# Patient Record
Sex: Female | Born: 1945 | Race: White | Hispanic: No | Marital: Married | Smoking: Never smoker
Health system: Southern US, Community
[De-identification: ages and names within clinical notes are randomized; demographics above are authoritative.]

## PROBLEM LIST (undated history)

## (undated) DIAGNOSIS — T8859XA Other complications of anesthesia, initial encounter: Secondary | ICD-10-CM

## (undated) DIAGNOSIS — Z98811 Dental restoration status: Secondary | ICD-10-CM

## (undated) DIAGNOSIS — H3530 Unspecified macular degeneration: Secondary | ICD-10-CM

## (undated) DIAGNOSIS — M7741 Metatarsalgia, right foot: Secondary | ICD-10-CM

## (undated) DIAGNOSIS — G43909 Migraine, unspecified, not intractable, without status migrainosus: Secondary | ICD-10-CM

## (undated) DIAGNOSIS — T4145XA Adverse effect of unspecified anesthetic, initial encounter: Secondary | ICD-10-CM

## (undated) DIAGNOSIS — M21611 Bunion of right foot: Secondary | ICD-10-CM

## (undated) DIAGNOSIS — M26629 Arthralgia of temporomandibular joint, unspecified side: Secondary | ICD-10-CM

## (undated) HISTORY — DX: Migraine, unspecified, not intractable, without status migrainosus: G43.909

---
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
## 1998-02-16 ENCOUNTER — Other Ambulatory Visit: Admission: RE | Admit: 1998-02-16 | Discharge: 1998-02-16 | Payer: Self-pay | Admitting: *Deleted

## 1999-02-16 ENCOUNTER — Other Ambulatory Visit: Admission: RE | Admit: 1999-02-16 | Discharge: 1999-02-16 | Payer: Self-pay | Admitting: *Deleted

## 2000-03-05 ENCOUNTER — Other Ambulatory Visit: Admission: RE | Admit: 2000-03-05 | Discharge: 2000-03-05 | Payer: Self-pay | Admitting: *Deleted

## 2000-03-06 ENCOUNTER — Ambulatory Visit (HOSPITAL_COMMUNITY): Admission: RE | Admit: 2000-03-06 | Discharge: 2000-03-06 | Payer: Self-pay | Admitting: Cardiology

## 2001-03-12 ENCOUNTER — Other Ambulatory Visit: Admission: RE | Admit: 2001-03-12 | Discharge: 2001-03-12 | Payer: Self-pay | Admitting: *Deleted

## 2002-04-15 ENCOUNTER — Other Ambulatory Visit: Admission: RE | Admit: 2002-04-15 | Discharge: 2002-04-15 | Payer: Self-pay | Admitting: *Deleted

## 2004-01-08 ENCOUNTER — Encounter: Admission: RE | Admit: 2004-01-08 | Discharge: 2004-01-08 | Payer: Self-pay | Admitting: *Deleted

## 2005-01-19 ENCOUNTER — Encounter: Admission: RE | Admit: 2005-01-19 | Discharge: 2005-01-19 | Payer: Self-pay | Admitting: *Deleted

## 2007-01-23 ENCOUNTER — Encounter: Admission: RE | Admit: 2007-01-23 | Discharge: 2007-01-23 | Payer: Self-pay | Admitting: Internal Medicine

## 2008-01-24 ENCOUNTER — Encounter: Admission: RE | Admit: 2008-01-24 | Discharge: 2008-01-24 | Payer: Self-pay | Admitting: Internal Medicine

## 2009-02-25 ENCOUNTER — Encounter: Admission: RE | Admit: 2009-02-25 | Discharge: 2009-02-25 | Payer: Self-pay | Admitting: Internal Medicine

## 2010-01-25 ENCOUNTER — Encounter (INDEPENDENT_AMBULATORY_CARE_PROVIDER_SITE_OTHER): Payer: Self-pay | Admitting: *Deleted

## 2010-03-03 ENCOUNTER — Encounter: Admission: RE | Admit: 2010-03-03 | Discharge: 2010-03-03 | Payer: Self-pay | Admitting: Internal Medicine

## 2010-07-19 NOTE — Letter (Signed)
Summary: Colonoscopy Letter  [facility]  [address] [place], [place] [address]   Phone: [phone]  Fax: [phone]      January 25, 2010 MRN: [id number]   Heather Lewis  [address] [place], [place]  [address]   Dear Ms. BOKHARI ,   According to your medical record, it is time for you to schedule a Colonoscopy. The American Cancer Society recommends this procedure as a method to detect early colon cancer. Patients with a family history of colon cancer, or a personal history of colon polyps or inflammatory bowel disease are at increased risk.  This letter has beeen generated based on the recommendations made at the time of your procedure. If you feel that in your particular situation this may no longer apply, please contact our office.  Please call our office at [phone] to schedule this appointment or to update your records at your earliest convenience.  Thank you for cooperating with [place] to provide you with the very best care possible.   Sincerely,  Hedwig Morton. Juanda Chance, M.D.  [facility]astroenterology Division [phone]

## 2010-11-04 NOTE — Procedures (Signed)
[facility]  Patient:    Heather Lewis, Heather Lewis                         MRN: [id number] Adm. Date:  [id number] Attending:  Mervin Hack CC:         Pershing Cox, M.D.   Procedure Report  PROCEDURE:  Colonoscopy.  ENDOSCOPIST:  Hedwig Morton. Juanda Chance, M.D.  INDICATIONS:  This 65 year old white female has a positive family history of colon polyps in her mother, father and a brother, who is 37 years old.  She had previous Hemoccults which were negative.  She denies any specific symptoms such as rectal bleeding or abdominal pain.  She is undergoing her first colonoscopy.  ENDOSCOPE:  Olympus single-channel video endoscope.  SEDATION:  Versed 5 mg IV, Demerol 50 mg IV.  FINDINGS:  Olympus single-channel videoscope was passed directly into the rectum to the sigmoid colon.  Patient was monitored by pulse oximeter; her oxygen saturation were normal.  Anal canal and rectal ampulla was unremarkable.  There were no hemorrhoids.  Sigmoid colon mucosa was normal. No diverticula.  Submucosal blood vessels were normal throughout the colon. Descending colon, splenic flexure, transverse colon, hepatic flexure were traversed without difficulty and showed normal-appearing mucosa.  The right colon and cecum were normal.  Ileocecal valve and appendiceal opening were noted.  Video photographs of the cecal pouch were obtained.  The colonoscope was then retracted and the colon decompressed.  No polyps were found.  IMPRESSION:  Normal colonoscopy to the cecum.  PLAN 1. High-fiber diet. 2. Repeat colonoscopy in 7 to 10 years. 3. Continue regular stool Hemoccults, as per Dr. Pershing Cox. DD:  03/06/00 TD:  03/06/00 Job: [id number]

## 2011-02-08 ENCOUNTER — Other Ambulatory Visit: Payer: Self-pay | Admitting: Internal Medicine

## 2011-02-08 DIAGNOSIS — Z1231 Encounter for screening mammogram for malignant neoplasm of breast: Secondary | ICD-10-CM

## 2011-02-09 ENCOUNTER — Encounter: Payer: Self-pay | Admitting: Internal Medicine

## 2011-03-06 ENCOUNTER — Ambulatory Visit (AMBULATORY_SURGERY_CENTER): Payer: Medicare Other | Admitting: *Deleted

## 2011-03-06 ENCOUNTER — Encounter: Payer: Self-pay | Admitting: Internal Medicine

## 2011-03-06 VITALS — Ht 63.0 in | Wt 121.0 lb

## 2011-03-06 DIAGNOSIS — Z1211 Encounter for screening for malignant neoplasm of colon: Secondary | ICD-10-CM

## 2011-03-06 MED ORDER — PEG-KCL-NACL-NASULF-NA ASC-C 100 G PO SOLR: ORAL | Status: DC

## 2011-03-14 ENCOUNTER — Ambulatory Visit
Admission: RE | Admit: 2011-03-14 | Discharge: 2011-03-14 | Disposition: A | Discharge status: Home / Self Care | Payer: Medicare Other | Source: Ambulatory Visit | Attending: Internal Medicine | Admitting: Internal Medicine

## 2011-03-14 DIAGNOSIS — Z1231 Encounter for screening mammogram for malignant neoplasm of breast: Secondary | ICD-10-CM

## 2011-03-20 ENCOUNTER — Ambulatory Visit (AMBULATORY_SURGERY_CENTER): Payer: Medicare Other | Admitting: Internal Medicine

## 2011-03-20 ENCOUNTER — Encounter: Payer: Self-pay | Admitting: Internal Medicine

## 2011-03-20 VITALS — BP 136/75 | HR 102 | Temp 99.2°F | Resp 15 | Ht 63.0 in | Wt 121.0 lb

## 2011-03-20 DIAGNOSIS — Z1211 Encounter for screening for malignant neoplasm of colon: Secondary | ICD-10-CM

## 2011-03-20 DIAGNOSIS — D126 Benign neoplasm of colon, unspecified: Secondary | ICD-10-CM

## 2011-03-20 DIAGNOSIS — Z800 Family history of malignant neoplasm of digestive organs: Secondary | ICD-10-CM

## 2011-03-20 MED ORDER — SODIUM CHLORIDE 0.9 % IV SOLN: 500.0000 mL | INTRAVENOUS | Status: DC

## 2011-03-20 NOTE — Patient Instructions (Signed)
Discharge instructions given with verbal understanding.  Handouts on polyps and diverticulosis given.  Resume previous medications. 

## 2011-03-21 ENCOUNTER — Telehealth: Payer: Self-pay | Admitting: *Deleted

## 2011-03-21 NOTE — Telephone Encounter (Signed)
Follow up Call- Patient questions:  Do you have a fever, pain , or abdominal swelling? no Pain Score  0 *  Have you tolerated food without any problems? yes  Have you been able to return to your normal activities? no  Do you have any questions about your discharge instructions: Diet   no Medications  no Follow up visit  no  Do you have questions or concerns about your Care? no  Actions: * If pain score is 4 or above: No action needed, pain <4.   

## 2011-03-23 ENCOUNTER — Encounter: Payer: Self-pay | Admitting: Internal Medicine

## 2012-03-04 ENCOUNTER — Other Ambulatory Visit: Payer: Self-pay | Admitting: Internal Medicine

## 2012-03-04 DIAGNOSIS — Z1231 Encounter for screening mammogram for malignant neoplasm of breast: Secondary | ICD-10-CM

## 2012-03-22 ENCOUNTER — Ambulatory Visit
Admission: RE | Admit: 2012-03-22 | Discharge: 2012-03-22 | Disposition: A | Discharge status: Home / Self Care | Payer: Medicare Other | Source: Ambulatory Visit | Attending: Internal Medicine | Admitting: Internal Medicine

## 2012-03-22 DIAGNOSIS — Z1231 Encounter for screening mammogram for malignant neoplasm of breast: Secondary | ICD-10-CM

## 2013-01-30 ENCOUNTER — Other Ambulatory Visit: Payer: Self-pay | Admitting: Dermatology

## 2013-03-07 ENCOUNTER — Other Ambulatory Visit: Payer: Self-pay

## 2013-03-07 DIAGNOSIS — Z1231 Encounter for screening mammogram for malignant neoplasm of breast: Secondary | ICD-10-CM

## 2013-03-27 ENCOUNTER — Ambulatory Visit
Admission: RE | Admit: 2013-03-27 | Discharge: 2013-03-27 | Disposition: A | Discharge status: Home / Self Care | Payer: Medicare Other | Source: Ambulatory Visit

## 2013-03-27 DIAGNOSIS — Z1231 Encounter for screening mammogram for malignant neoplasm of breast: Secondary | ICD-10-CM

## 2013-08-15 ENCOUNTER — Other Ambulatory Visit: Payer: Self-pay | Admitting: Dermatology

## 2013-09-25 ENCOUNTER — Other Ambulatory Visit: Payer: Self-pay | Admitting: Dermatology

## 2014-03-24 ENCOUNTER — Other Ambulatory Visit: Payer: Self-pay

## 2014-03-24 DIAGNOSIS — Z1239 Encounter for other screening for malignant neoplasm of breast: Secondary | ICD-10-CM

## 2014-04-02 ENCOUNTER — Ambulatory Visit
Admission: RE | Admit: 2014-04-02 | Discharge: 2014-04-02 | Disposition: A | Discharge status: Home / Self Care | Payer: Medicare HMO | Source: Ambulatory Visit

## 2014-04-02 DIAGNOSIS — Z1239 Encounter for other screening for malignant neoplasm of breast: Secondary | ICD-10-CM

## 2015-04-02 ENCOUNTER — Other Ambulatory Visit: Payer: Self-pay

## 2015-04-02 DIAGNOSIS — Z1231 Encounter for screening mammogram for malignant neoplasm of breast: Secondary | ICD-10-CM

## 2015-04-21 ENCOUNTER — Ambulatory Visit
Admission: RE | Admit: 2015-04-21 | Discharge: 2015-04-21 | Disposition: A | Discharge status: Home / Self Care | Payer: Medicare HMO | Source: Ambulatory Visit

## 2015-04-21 DIAGNOSIS — Z0000 Encounter for general adult medical examination without abnormal findings: Secondary | ICD-10-CM | POA: Diagnosis not present

## 2015-04-21 DIAGNOSIS — E559 Vitamin D deficiency, unspecified: Secondary | ICD-10-CM | POA: Diagnosis not present

## 2015-04-21 DIAGNOSIS — M859 Disorder of bone density and structure, unspecified: Secondary | ICD-10-CM | POA: Diagnosis not present

## 2015-04-21 DIAGNOSIS — Z1231 Encounter for screening mammogram for malignant neoplasm of breast: Secondary | ICD-10-CM

## 2015-04-23 ENCOUNTER — Other Ambulatory Visit: Payer: Self-pay | Admitting: Internal Medicine

## 2015-04-23 DIAGNOSIS — R928 Other abnormal and inconclusive findings on diagnostic imaging of breast: Secondary | ICD-10-CM

## 2015-04-30 ENCOUNTER — Ambulatory Visit
Admission: RE | Admit: 2015-04-30 | Discharge: 2015-04-30 | Disposition: A | Discharge status: Home / Self Care | Payer: Medicare HMO | Source: Ambulatory Visit | Attending: Internal Medicine | Admitting: Internal Medicine

## 2015-04-30 DIAGNOSIS — E559 Vitamin D deficiency, unspecified: Secondary | ICD-10-CM | POA: Diagnosis not present

## 2015-04-30 DIAGNOSIS — Z0000 Encounter for general adult medical examination without abnormal findings: Secondary | ICD-10-CM | POA: Diagnosis not present

## 2015-04-30 DIAGNOSIS — G43909 Migraine, unspecified, not intractable, without status migrainosus: Secondary | ICD-10-CM | POA: Diagnosis not present

## 2015-04-30 DIAGNOSIS — R928 Other abnormal and inconclusive findings on diagnostic imaging of breast: Secondary | ICD-10-CM

## 2015-04-30 DIAGNOSIS — R69 Illness, unspecified: Secondary | ICD-10-CM | POA: Diagnosis not present

## 2015-04-30 DIAGNOSIS — Z801 Family history of malignant neoplasm of trachea, bronchus and lung: Secondary | ICD-10-CM | POA: Diagnosis not present

## 2015-04-30 DIAGNOSIS — D126 Benign neoplasm of colon, unspecified: Secondary | ICD-10-CM | POA: Diagnosis not present

## 2015-04-30 DIAGNOSIS — M545 Low back pain: Secondary | ICD-10-CM | POA: Diagnosis not present

## 2015-04-30 DIAGNOSIS — N6489 Other specified disorders of breast: Secondary | ICD-10-CM | POA: Diagnosis not present

## 2015-04-30 DIAGNOSIS — Z6821 Body mass index (BMI) 21.0-21.9, adult: Secondary | ICD-10-CM | POA: Diagnosis not present

## 2015-04-30 DIAGNOSIS — N390 Urinary tract infection, site not specified: Secondary | ICD-10-CM | POA: Diagnosis not present

## 2015-04-30 DIAGNOSIS — M859 Disorder of bone density and structure, unspecified: Secondary | ICD-10-CM | POA: Diagnosis not present

## 2015-05-05 DIAGNOSIS — Z1212 Encounter for screening for malignant neoplasm of rectum: Secondary | ICD-10-CM | POA: Diagnosis not present

## 2015-07-13 DIAGNOSIS — R69 Illness, unspecified: Secondary | ICD-10-CM | POA: Diagnosis not present

## 2015-08-09 DIAGNOSIS — R69 Illness, unspecified: Secondary | ICD-10-CM | POA: Diagnosis not present

## 2015-09-09 DIAGNOSIS — D2272 Melanocytic nevi of left lower limb, including hip: Secondary | ICD-10-CM | POA: Diagnosis not present

## 2015-09-09 DIAGNOSIS — L570 Actinic keratosis: Secondary | ICD-10-CM | POA: Diagnosis not present

## 2015-09-09 DIAGNOSIS — D229 Melanocytic nevi, unspecified: Secondary | ICD-10-CM | POA: Diagnosis not present

## 2015-09-09 DIAGNOSIS — Z23 Encounter for immunization: Secondary | ICD-10-CM | POA: Diagnosis not present

## 2015-09-09 DIAGNOSIS — D224 Melanocytic nevi of scalp and neck: Secondary | ICD-10-CM | POA: Diagnosis not present

## 2015-09-09 DIAGNOSIS — M79672 Pain in left foot: Secondary | ICD-10-CM | POA: Diagnosis not present

## 2015-09-09 DIAGNOSIS — Z6822 Body mass index (BMI) 22.0-22.9, adult: Secondary | ICD-10-CM | POA: Diagnosis not present

## 2015-09-09 DIAGNOSIS — D225 Melanocytic nevi of trunk: Secondary | ICD-10-CM | POA: Diagnosis not present

## 2015-09-09 DIAGNOSIS — S99922A Unspecified injury of left foot, initial encounter: Secondary | ICD-10-CM | POA: Diagnosis not present

## 2015-09-09 DIAGNOSIS — Z808 Family history of malignant neoplasm of other organs or systems: Secondary | ICD-10-CM | POA: Diagnosis not present

## 2015-09-09 DIAGNOSIS — L821 Other seborrheic keratosis: Secondary | ICD-10-CM | POA: Diagnosis not present

## 2015-09-09 DIAGNOSIS — Z86018 Personal history of other benign neoplasm: Secondary | ICD-10-CM | POA: Diagnosis not present

## 2015-09-09 DIAGNOSIS — D2230 Melanocytic nevi of unspecified part of face: Secondary | ICD-10-CM | POA: Diagnosis not present

## 2015-09-13 DIAGNOSIS — M79672 Pain in left foot: Secondary | ICD-10-CM | POA: Diagnosis not present

## 2015-09-27 DIAGNOSIS — M79672 Pain in left foot: Secondary | ICD-10-CM | POA: Diagnosis not present

## 2015-09-28 DIAGNOSIS — H2513 Age-related nuclear cataract, bilateral: Secondary | ICD-10-CM | POA: Diagnosis not present

## 2015-09-28 DIAGNOSIS — H35362 Drusen (degenerative) of macula, left eye: Secondary | ICD-10-CM | POA: Diagnosis not present

## 2015-09-28 DIAGNOSIS — H353121 Nonexudative age-related macular degeneration, left eye, early dry stage: Secondary | ICD-10-CM | POA: Diagnosis not present

## 2015-10-25 DIAGNOSIS — M79672 Pain in left foot: Secondary | ICD-10-CM | POA: Diagnosis not present

## 2015-11-09 ENCOUNTER — Encounter: Payer: Self-pay | Admitting: Internal Medicine

## 2015-11-29 DIAGNOSIS — M79672 Pain in left foot: Secondary | ICD-10-CM | POA: Diagnosis not present

## 2016-01-24 ENCOUNTER — Encounter: Payer: Self-pay | Admitting: Gastroenterology

## 2016-02-10 DIAGNOSIS — R69 Illness, unspecified: Secondary | ICD-10-CM | POA: Diagnosis not present

## 2016-03-31 ENCOUNTER — Other Ambulatory Visit: Payer: Self-pay | Admitting: Internal Medicine

## 2016-03-31 DIAGNOSIS — Z1231 Encounter for screening mammogram for malignant neoplasm of breast: Secondary | ICD-10-CM

## 2016-04-27 ENCOUNTER — Ambulatory Visit
Admission: RE | Admit: 2016-04-27 | Discharge: 2016-04-27 | Disposition: A | Discharge status: Home / Self Care | Payer: Medicare HMO | Source: Ambulatory Visit | Attending: Internal Medicine | Admitting: Internal Medicine

## 2016-04-27 DIAGNOSIS — Z1231 Encounter for screening mammogram for malignant neoplasm of breast: Secondary | ICD-10-CM

## 2016-04-28 DIAGNOSIS — Z0000 Encounter for general adult medical examination without abnormal findings: Secondary | ICD-10-CM | POA: Diagnosis not present

## 2016-04-28 DIAGNOSIS — E559 Vitamin D deficiency, unspecified: Secondary | ICD-10-CM | POA: Diagnosis not present

## 2016-05-05 DIAGNOSIS — Z801 Family history of malignant neoplasm of trachea, bronchus and lung: Secondary | ICD-10-CM | POA: Diagnosis not present

## 2016-05-05 DIAGNOSIS — M859 Disorder of bone density and structure, unspecified: Secondary | ICD-10-CM | POA: Diagnosis not present

## 2016-05-05 DIAGNOSIS — R69 Illness, unspecified: Secondary | ICD-10-CM | POA: Diagnosis not present

## 2016-05-05 DIAGNOSIS — D126 Benign neoplasm of colon, unspecified: Secondary | ICD-10-CM | POA: Diagnosis not present

## 2016-05-05 DIAGNOSIS — M545 Low back pain: Secondary | ICD-10-CM | POA: Diagnosis not present

## 2016-05-05 DIAGNOSIS — Z6821 Body mass index (BMI) 21.0-21.9, adult: Secondary | ICD-10-CM | POA: Diagnosis not present

## 2016-05-05 DIAGNOSIS — Z0000 Encounter for general adult medical examination without abnormal findings: Secondary | ICD-10-CM | POA: Diagnosis not present

## 2016-05-05 DIAGNOSIS — G43909 Migraine, unspecified, not intractable, without status migrainosus: Secondary | ICD-10-CM | POA: Diagnosis not present

## 2016-05-05 DIAGNOSIS — G4700 Insomnia, unspecified: Secondary | ICD-10-CM | POA: Diagnosis not present

## 2016-05-05 DIAGNOSIS — E559 Vitamin D deficiency, unspecified: Secondary | ICD-10-CM | POA: Diagnosis not present

## 2016-05-08 ENCOUNTER — Telehealth: Payer: Self-pay | Admitting: Gastroenterology

## 2016-05-08 NOTE — Telephone Encounter (Signed)
OK 

## 2016-05-26 ENCOUNTER — Encounter: Payer: Self-pay | Admitting: Gastroenterology

## 2016-07-25 ENCOUNTER — Ambulatory Visit (AMBULATORY_SURGERY_CENTER): Payer: Self-pay

## 2016-07-25 VITALS — Ht 63.0 in | Wt 125.6 lb

## 2016-07-25 DIAGNOSIS — Z8371 Family history of colonic polyps: Secondary | ICD-10-CM

## 2016-07-25 DIAGNOSIS — Z86010 Personal history of colonic polyps: Secondary | ICD-10-CM

## 2016-07-25 MED ORDER — NA SULFATE-K SULFATE-MG SULF 17.5-3.13-1.6 GM/177ML PO SOLN: ORAL | 0 refills | Status: DC

## 2016-07-25 NOTE — Progress Notes (Signed)
Per pt, no allergies to soy or egg products.Pt not taking any weight loss meds or using  O2 at home.   Per pt, hard to wake up past certain sedation.

## 2016-07-26 ENCOUNTER — Encounter: Payer: Self-pay | Admitting: Gastroenterology

## 2016-08-08 ENCOUNTER — Encounter: Payer: Self-pay | Admitting: Gastroenterology

## 2016-08-08 ENCOUNTER — Ambulatory Visit (AMBULATORY_SURGERY_CENTER): Payer: Medicare HMO | Admitting: Gastroenterology

## 2016-08-08 VITALS — BP 119/71 | HR 72 | Temp 98.4°F | Resp 16 | Ht 63.0 in | Wt 125.0 lb

## 2016-08-08 DIAGNOSIS — D123 Benign neoplasm of transverse colon: Secondary | ICD-10-CM

## 2016-08-08 DIAGNOSIS — Z86010 Personal history of colonic polyps: Secondary | ICD-10-CM | POA: Diagnosis not present

## 2016-08-08 DIAGNOSIS — D122 Benign neoplasm of ascending colon: Secondary | ICD-10-CM | POA: Diagnosis not present

## 2016-08-08 DIAGNOSIS — Z1211 Encounter for screening for malignant neoplasm of colon: Secondary | ICD-10-CM | POA: Diagnosis not present

## 2016-08-08 HISTORY — PX: COLONOSCOPY WITH PROPOFOL: SHX5780

## 2016-08-08 MED ORDER — SODIUM CHLORIDE 0.9 % IV SOLN: 500.0000 mL | INTRAVENOUS | Status: DC

## 2016-08-08 NOTE — Op Note (Signed)
[facility] Patient Name: Heather Lewis Procedure Date: 08/08/2016 10:22 AM MRN: [id number] Endoscopist: Ladene Artist , MD Age: 71 Referring MD:  Date of Birth: 12/06/1945 Gender: Female Account #: [id number] Procedure:                Colonoscopy Indications:              Surveillance: Personal history of adenomatous                            polyps on last colonoscopy > 5 years ago. Family                            history of colon polyps-both parent and a sibling. Medicines:                Monitored Anesthesia Care Procedure:                Pre-Anesthesia Assessment:                           - Prior to the procedure, a History and Physical                            was performed, and patient medications and                            allergies were reviewed. The patient's tolerance of                            previous anesthesia was also reviewed. The risks                            and benefits of the procedure and the sedation                            options and risks were discussed with the patient.                            All questions were answered, and informed consent                            was obtained. Prior Anticoagulants: The patient has                            taken no previous anticoagulant or antiplatelet                            agents. ASA Grade Assessment: II - A patient with                            mild systemic disease. After reviewing the risks                            and benefits, the patient was deemed in  satisfactory condition to undergo the procedure.                           After obtaining informed consent, the colonoscope                            was passed under direct vision. Throughout the                            procedure, the patient's blood pressure, pulse, and                            oxygen saturations were monitored continuously. The                            Model  PCF-[id number]) scope was introduced                            through the anus and advanced to the the cecum,                            identified by appendiceal orifice and ileocecal                            valve. The ileocecal valve, appendiceal orifice,                            and rectum were photographed. The quality of the                            bowel preparation was good. The colonoscopy was                            performed without difficulty. The patient tolerated                            the procedure well. Scope In: 11:01:43 AM Scope Out: 11:17:38 AM Scope Withdrawal Time: 0 hours 10 minutes 50 seconds  Total Procedure Duration: 0 hours 15 minutes 55 seconds  Findings:                 The perianal and digital rectal examinations were                            normal.                           Three sessile polyps were found in the transverse                            colon (1) and ascending colon (2). The polyps were                            5 to 7 mm in size. These polyps were removed with a  cold snare. Resection and retrieval were complete.                           A few small-mouthed diverticula were found in the                            sigmoid colon.                           The exam was otherwise without abnormality on                            direct and retroflexion views. Complications:            No immediate complications. Estimated blood loss:                            None. Estimated Blood Loss:     Estimated blood loss: none. Impression:               - Three 5 to 7 mm polyps in the transverse colon                            and in the ascending colon, removed with a cold                            snare. Resected and retrieved.                           - Diverticulosis in the sigmoid colon.                           - The examination was otherwise normal on direct                            and  retroflexion views. Recommendation:           - Repeat colonoscopy in 5 years for surveillance.                           - Patient has a contact number available for                            emergencies. The signs and symptoms of potential                            delayed complications were discussed with the                            patient. Return to normal activities tomorrow.                            Written discharge instructions were provided to the                            patient.                           -  Resume previous diet.                           - Continue present medications.                           - Await pathology results. Ladene Artist, MD 08/08/2016 11:22:22 AM This report has been signed electronically.

## 2016-08-08 NOTE — Patient Instructions (Signed)
Discharge instructions given. Handouts on polyps and diverticulosis. Resume previous medications. YOU HAD AN ENDOSCOPIC PROCEDURE TODAY AT THE [facility]:   Refer to the procedure report that was given to you for any specific questions about what was found during the examination.  If the procedure report does not answer your questions, please call your gastroenterologist to clarify.  If you requested that your care partner not be given the details of your procedure findings, then the procedure report has been included in a sealed envelope for you to review at your convenience later.  YOU SHOULD EXPECT: Some feelings of bloating in the abdomen. Passage of more gas than usual.  Walking can help get rid of the air that was put into your GI tract during the procedure and reduce the bloating. If you had a lower endoscopy (such as a colonoscopy or flexible sigmoidoscopy) you may notice spotting of blood in your stool or on the toilet paper. If you underwent a bowel prep for your procedure, you may not have a normal bowel movement for a few days.  Please Note:  You might notice some irritation and congestion in your nose or some drainage.  This is from the oxygen used during your procedure.  There is no need for concern and it should clear up in a day or so.  SYMPTOMS TO REPORT IMMEDIATELY:   Following lower endoscopy (colonoscopy or flexible sigmoidoscopy):  Excessive amounts of blood in the stool  Significant tenderness or worsening of abdominal pains  Swelling of the abdomen that is new, acute  Fever of 100F or higher   For urgent or emergent issues, a gastroenterologist can be reached at any hour by calling [phone].   DIET:  We do recommend a small meal at first, but then you may proceed to your regular diet.  Drink plenty of fluids but you should avoid alcoholic beverages for 24 hours.  ACTIVITY:  You should plan to take it easy for the rest of today and you should NOT  DRIVE or use heavy machinery until tomorrow (because of the sedation medicines used during the test).    FOLLOW UP: Our staff will call the number listed on your records the next business day following your procedure to check on you and address any questions or concerns that you may have regarding the information given to you following your procedure. If we do not reach you, we will leave a message.  However, if you are feeling well and you are not experiencing any problems, there is no need to return our call.  We will assume that you have returned to your regular daily activities without incident.  If any biopsies were taken you will be contacted by phone or by letter within the next 1-3 weeks.  Please call us at [phone] if you have not heard about the biopsies in 3 weeks.    SIGNATURES/CONFIDENTIALITY: You and/or your care partner have signed paperwork which will be entered into your electronic medical record.  These signatures attest to the fact that that the information above on your After Visit Summary has been reviewed and is understood.  Full responsibility of the confidentiality of this discharge information lies with you and/or your care-partner. 

## 2016-08-08 NOTE — Progress Notes (Signed)
Called to room to assist during endoscopic procedure.  Patient ID and intended procedure confirmed with present staff. Received instructions for my participation in the procedure from the performing physician.  

## 2016-08-08 NOTE — Progress Notes (Signed)
Report given to PACU, vss 

## 2016-08-09 ENCOUNTER — Telehealth: Payer: Self-pay

## 2016-08-09 NOTE — Telephone Encounter (Signed)
  Follow up Call-  Call back number 08/08/2016  Post procedure Call Back phone  # [phone]  Permission to leave phone message Yes  Some recent data might be hidden     Patient questions:  Do you have a fever, pain , or abdominal swelling? No. Pain Score  0 *  Have you tolerated food without any problems? Yes.    Have you been able to return to your normal activities? Yes.    Do you have any questions about your discharge instructions: Diet   No. Medications  No. Follow up visit  No.  Do you have questions or concerns about your Care? No.  Actions: * If pain score is 4 or above: No action needed, pain <4.  Pt reported no problems. maw

## 2016-08-15 DIAGNOSIS — R69 Illness, unspecified: Secondary | ICD-10-CM | POA: Diagnosis not present

## 2016-08-23 ENCOUNTER — Encounter: Payer: Self-pay | Admitting: Gastroenterology

## 2016-09-14 DIAGNOSIS — D224 Melanocytic nevi of scalp and neck: Secondary | ICD-10-CM | POA: Diagnosis not present

## 2016-09-14 DIAGNOSIS — D225 Melanocytic nevi of trunk: Secondary | ICD-10-CM | POA: Diagnosis not present

## 2016-09-14 DIAGNOSIS — L821 Other seborrheic keratosis: Secondary | ICD-10-CM | POA: Diagnosis not present

## 2016-09-14 DIAGNOSIS — Z23 Encounter for immunization: Secondary | ICD-10-CM | POA: Diagnosis not present

## 2016-09-14 DIAGNOSIS — D2239 Melanocytic nevi of other parts of face: Secondary | ICD-10-CM | POA: Diagnosis not present

## 2016-09-14 DIAGNOSIS — D2272 Melanocytic nevi of left lower limb, including hip: Secondary | ICD-10-CM | POA: Diagnosis not present

## 2016-09-28 DIAGNOSIS — H5203 Hypermetropia, bilateral: Secondary | ICD-10-CM | POA: Diagnosis not present

## 2016-09-28 DIAGNOSIS — H353131 Nonexudative age-related macular degeneration, bilateral, early dry stage: Secondary | ICD-10-CM | POA: Diagnosis not present

## 2017-02-13 DIAGNOSIS — R69 Illness, unspecified: Secondary | ICD-10-CM | POA: Diagnosis not present

## 2017-04-02 ENCOUNTER — Other Ambulatory Visit: Payer: Self-pay | Admitting: Internal Medicine

## 2017-04-02 DIAGNOSIS — Z1239 Encounter for other screening for malignant neoplasm of breast: Secondary | ICD-10-CM

## 2017-05-07 ENCOUNTER — Ambulatory Visit
Admission: RE | Admit: 2017-05-07 | Discharge: 2017-05-07 | Disposition: A | Discharge status: Home / Self Care | Payer: Medicare HMO | Source: Ambulatory Visit | Attending: Internal Medicine | Admitting: Internal Medicine

## 2017-05-07 DIAGNOSIS — R82998 Other abnormal findings in urine: Secondary | ICD-10-CM | POA: Diagnosis not present

## 2017-05-07 DIAGNOSIS — M859 Disorder of bone density and structure, unspecified: Secondary | ICD-10-CM | POA: Diagnosis not present

## 2017-05-07 DIAGNOSIS — Z0000 Encounter for general adult medical examination without abnormal findings: Secondary | ICD-10-CM | POA: Diagnosis not present

## 2017-05-07 DIAGNOSIS — E559 Vitamin D deficiency, unspecified: Secondary | ICD-10-CM | POA: Diagnosis not present

## 2017-05-07 DIAGNOSIS — Z1239 Encounter for other screening for malignant neoplasm of breast: Secondary | ICD-10-CM

## 2017-05-07 DIAGNOSIS — Z1231 Encounter for screening mammogram for malignant neoplasm of breast: Secondary | ICD-10-CM | POA: Diagnosis not present

## 2017-05-16 DIAGNOSIS — Z23 Encounter for immunization: Secondary | ICD-10-CM | POA: Diagnosis not present

## 2017-05-16 DIAGNOSIS — N390 Urinary tract infection, site not specified: Secondary | ICD-10-CM | POA: Diagnosis not present

## 2017-05-16 DIAGNOSIS — M859 Disorder of bone density and structure, unspecified: Secondary | ICD-10-CM | POA: Diagnosis not present

## 2017-05-16 DIAGNOSIS — G4709 Other insomnia: Secondary | ICD-10-CM | POA: Diagnosis not present

## 2017-05-16 DIAGNOSIS — R69 Illness, unspecified: Secondary | ICD-10-CM | POA: Diagnosis not present

## 2017-05-16 DIAGNOSIS — E559 Vitamin D deficiency, unspecified: Secondary | ICD-10-CM | POA: Diagnosis not present

## 2017-05-16 DIAGNOSIS — M545 Low back pain: Secondary | ICD-10-CM | POA: Diagnosis not present

## 2017-05-16 DIAGNOSIS — G43909 Migraine, unspecified, not intractable, without status migrainosus: Secondary | ICD-10-CM | POA: Diagnosis not present

## 2017-05-16 DIAGNOSIS — Z0000 Encounter for general adult medical examination without abnormal findings: Secondary | ICD-10-CM | POA: Diagnosis not present

## 2017-05-16 DIAGNOSIS — Z801 Family history of malignant neoplasm of trachea, bronchus and lung: Secondary | ICD-10-CM | POA: Diagnosis not present

## 2017-05-16 DIAGNOSIS — D126 Benign neoplasm of colon, unspecified: Secondary | ICD-10-CM | POA: Diagnosis not present

## 2017-07-03 DIAGNOSIS — H00024 Hordeolum internum left upper eyelid: Secondary | ICD-10-CM | POA: Diagnosis not present

## 2017-07-16 ENCOUNTER — Ambulatory Visit (INDEPENDENT_AMBULATORY_CARE_PROVIDER_SITE_OTHER): Payer: Medicare HMO | Admitting: Physician Assistant

## 2017-07-16 ENCOUNTER — Ambulatory Visit (INDEPENDENT_AMBULATORY_CARE_PROVIDER_SITE_OTHER): Payer: Medicare HMO

## 2017-07-16 ENCOUNTER — Encounter (INDEPENDENT_AMBULATORY_CARE_PROVIDER_SITE_OTHER): Payer: Self-pay | Admitting: Physician Assistant

## 2017-07-16 ENCOUNTER — Telehealth (INDEPENDENT_AMBULATORY_CARE_PROVIDER_SITE_OTHER): Payer: Self-pay | Admitting: Physician Assistant

## 2017-07-16 VITALS — Ht 63.0 in | Wt 125.0 lb

## 2017-07-16 DIAGNOSIS — Q6621 Congenital metatarsus primus varus: Secondary | ICD-10-CM | POA: Diagnosis not present

## 2017-07-16 DIAGNOSIS — M79671 Pain in right foot: Secondary | ICD-10-CM

## 2017-07-16 DIAGNOSIS — M7741 Metatarsalgia, right foot: Secondary | ICD-10-CM | POA: Diagnosis not present

## 2017-07-16 DIAGNOSIS — Q66211 Congenital metatarsus primus varus, right foot: Secondary | ICD-10-CM

## 2017-07-16 DIAGNOSIS — Q66212 Congenital metatarsus primus varus, left foot: Secondary | ICD-10-CM

## 2017-07-16 NOTE — Progress Notes (Signed)
Office Visit Note   Patient: Heather Lewis           Date of Birth: Nov 13, 1945           MRN: [id number] Visit Date: 07/16/2017              Requested by: Haywood Pao, MD [address] [place], [place] [address] PCP: Haywood Pao, MD   Assessment & Plan: Visit Diagnoses:  1. Pain in right foot   2. Metatarsalgia, right foot   3. 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  Questions encouraged and answered at length today.  Did give her a Hapads placed disc proximal to the second and third metatarsals right shoe.  Follow-Up Instructions: Return if symptoms worsen or fail to improve.   Orders:  Orders Placed This Encounter  Procedures  . XR Foot Complete Right   No orders of the defined types were placed in this encounter.     Procedures: No procedures performed   Clinical Data: No additional findings.   Subjective: Chief Complaint  Patient presents with  . Right Foot - Pain    HPI Heather Lewis comes in today with right foot pain.  She was last seen 11/29/2015 for follow-up care of the left second metatarsal fracture.  She states that she is having pain mostly in her right foot which started in December 2018.  No known injury.  She states she has difficulty bending her toes or walking.  She went to [facility] and had severe pain and actually be pushed in a wheelchair after a few days due to the pain in her foot.  She does note some swelling about the foot.  She wears no open back shoes. Review of Systems Please see HPI otherwise negative  Objective: Vital Signs: Ht 5\' 3"  (1.6 m)   Wt 125 lb (56.7 kg)   BMI 22.14 kg/m   Physical Exam  Constitutional: She  is oriented to person, place, and time. She appears well-developed and well-nourished. She appears distressed.  Cardiovascular: Intact distal pulses.  Pulmonary/Chest: Effort normal.  Neurological: She is alert and oriented to person, place, and time.  Skin: She is not diaphoretic.  Psychiatric: She has a normal mood and affect.    Ortho Exam Bilateral feet nontender over the posterior tibial tendons, peroneal tendons.  No erythema ecchymosis of either foot.  Slight edema of the right foot compared to left over the dorsal aspect of the foot.  Right foot tenderness under the second third metatarsal heads.  Bilateral feet with hallux valgus deformities with prominence varus deformities.  She is developing cockup toe deformities of the second toes bilaterally due to crowding.  Sensation is intact bilateral feet.  She has good range of motion bilateral ankles without pain.  Nontender over the medial tubercle of the calcaneus bilaterally. Specialty Comments:  No specialty comments available.  Imaging: Xr Foot Complete Right  Result Date: 07/16/2017 Right foot 3 views: No acute fracture.  Hallux valgus deformities with primus varus.  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  Midfoot without significant arthritic changes.  No other bony abnormalities.    PMFS History: There  are no active problems to display for this patient.  Past Medical History:  Diagnosis Date  . Allergy    seasonal  . Anesthesia complication    Per pt, "Hard to wake up past certain sedations."  . Insomnia   . Migraines     Family History  Problem Relation Age of Onset  . Colon polyps Mother   . Colon polyps Father   . Colon polyps Brother     Past Surgical History:  Procedure Laterality Date  . APPENDECTOMY  1960  . COLONOSCOPY    . TONSILLECTOMY AND ADENOIDECTOMY  1954   Social History   Occupational History  . Not on file  Tobacco Use  . Smoking status: Never Smoker    . Smokeless tobacco: Never Used  Substance and Sexual Activity  . Alcohol use: Yes    Alcohol/week: 1.2 oz    Types: 2 Glasses of wine per week  . Drug use: No  . Sexual activity: Not on file

## 2017-07-16 NOTE — Telephone Encounter (Signed)
See below

## 2017-07-16 NOTE — Telephone Encounter (Signed)
Patient would like to be referred to Dr. Doran Durand at [facility].

## 2017-07-17 ENCOUNTER — Other Ambulatory Visit (INDEPENDENT_AMBULATORY_CARE_PROVIDER_SITE_OTHER): Payer: Self-pay

## 2017-07-17 DIAGNOSIS — M25571 Pain in right ankle and joints of right foot: Secondary | ICD-10-CM

## 2017-07-17 NOTE — Telephone Encounter (Signed)
Ok

## 2017-07-17 NOTE — Telephone Encounter (Signed)
Sent order/referral for patient

## 2017-08-07 DIAGNOSIS — M21611 Bunion of right foot: Secondary | ICD-10-CM | POA: Diagnosis not present

## 2017-08-07 DIAGNOSIS — M79672 Pain in left foot: Secondary | ICD-10-CM | POA: Diagnosis not present

## 2017-08-07 DIAGNOSIS — M79671 Pain in right foot: Secondary | ICD-10-CM | POA: Diagnosis not present

## 2017-08-07 DIAGNOSIS — M7741 Metatarsalgia, right foot: Secondary | ICD-10-CM | POA: Diagnosis not present

## 2017-08-07 DIAGNOSIS — M21612 Bunion of left foot: Secondary | ICD-10-CM | POA: Diagnosis not present

## 2017-08-13 ENCOUNTER — Other Ambulatory Visit: Payer: Self-pay | Admitting: Orthopedic Surgery

## 2017-08-17 DIAGNOSIS — M7741 Metatarsalgia, right foot: Secondary | ICD-10-CM

## 2017-08-17 DIAGNOSIS — M21611 Bunion of right foot: Secondary | ICD-10-CM

## 2017-08-17 HISTORY — DX: Metatarsalgia, right foot: M77.41

## 2017-08-17 HISTORY — DX: Bunion of right foot: M21.611

## 2017-08-20 DIAGNOSIS — R69 Illness, unspecified: Secondary | ICD-10-CM | POA: Diagnosis not present

## 2017-08-25 DIAGNOSIS — M21619 Bunion of unspecified foot: Secondary | ICD-10-CM | POA: Diagnosis not present

## 2017-08-30 DIAGNOSIS — M7741 Metatarsalgia, right foot: Secondary | ICD-10-CM | POA: Diagnosis not present

## 2017-08-31 ENCOUNTER — Encounter (HOSPITAL_BASED_OUTPATIENT_CLINIC_OR_DEPARTMENT_OTHER): Payer: Self-pay | Admitting: *Deleted

## 2017-08-31 ENCOUNTER — Other Ambulatory Visit: Payer: Self-pay

## 2017-09-06 ENCOUNTER — Ambulatory Visit (HOSPITAL_BASED_OUTPATIENT_CLINIC_OR_DEPARTMENT_OTHER): Payer: Medicare HMO | Admitting: Certified Registered"

## 2017-09-06 ENCOUNTER — Ambulatory Visit (HOSPITAL_BASED_OUTPATIENT_CLINIC_OR_DEPARTMENT_OTHER)
Admission: RE | Admit: 2017-09-06 | Discharge: 2017-09-06 | Disposition: A | Discharge status: Home / Self Care | Payer: Medicare HMO | Source: Ambulatory Visit | Attending: Orthopedic Surgery | Admitting: Orthopedic Surgery

## 2017-09-06 ENCOUNTER — Other Ambulatory Visit: Payer: Self-pay

## 2017-09-06 ENCOUNTER — Encounter (HOSPITAL_BASED_OUTPATIENT_CLINIC_OR_DEPARTMENT_OTHER): Payer: Self-pay | Admitting: Certified Registered"

## 2017-09-06 ENCOUNTER — Encounter (HOSPITAL_BASED_OUTPATIENT_CLINIC_OR_DEPARTMENT_OTHER)
Admission: RE | Disposition: A | Discharge status: Home / Self Care | Payer: Self-pay | Source: Ambulatory Visit | Attending: Orthopedic Surgery

## 2017-09-06 DIAGNOSIS — M21611 Bunion of right foot: Secondary | ICD-10-CM | POA: Diagnosis not present

## 2017-09-06 DIAGNOSIS — M2011 Hallux valgus (acquired), right foot: Secondary | ICD-10-CM | POA: Diagnosis not present

## 2017-09-06 DIAGNOSIS — Z79899 Other long term (current) drug therapy: Secondary | ICD-10-CM | POA: Insufficient documentation

## 2017-09-06 HISTORY — DX: Metatarsalgia, right foot: M77.41

## 2017-09-06 HISTORY — DX: Other complications of anesthesia, initial encounter: T88.59XA

## 2017-09-06 HISTORY — DX: Dental restoration status: Z98.811

## 2017-09-06 HISTORY — DX: Adverse effect of unspecified anesthetic, initial encounter: T41.45XA

## 2017-09-06 HISTORY — PX: BUNIONECTOMY WITH WEIL OSTEOTOMY: SHX5604

## 2017-09-06 HISTORY — DX: Unspecified macular degeneration: H35.30

## 2017-09-06 HISTORY — DX: Bunion of right foot: M21.611

## 2017-09-06 HISTORY — DX: Arthralgia of temporomandibular joint, unspecified side: M26.629

## 2017-09-06 SURGERY — BUNIONECTOMY WITH WEIL OSTEOTOMY
Anesthesia: General | Site: Foot | Laterality: Right

## 2017-09-06 MED ORDER — MIDAZOLAM HCL 2 MG/2ML IJ SOLN
INTRAMUSCULAR | Status: AC
  Filled 2017-09-06: qty 2

## 2017-09-06 MED ORDER — LACTATED RINGERS IV SOLN
INTRAVENOUS | Status: DC
  Administered 2017-09-06 (×2): via INTRAVENOUS

## 2017-09-06 MED ORDER — FENTANYL CITRATE (PF) 100 MCG/2ML IJ SOLN
INTRAMUSCULAR | Status: AC
  Filled 2017-09-06: qty 2

## 2017-09-06 MED ORDER — 0.9 % SODIUM CHLORIDE (POUR BTL) OPTIME
TOPICAL | Status: DC | PRN
  Administered 2017-09-06: 200 mL

## 2017-09-06 MED ORDER — SCOPOLAMINE 1 MG/3DAYS TD PT72: 1.0000 | MEDICATED_PATCH | Freq: Once | TRANSDERMAL | Status: DC | PRN

## 2017-09-06 MED ORDER — MEPERIDINE HCL 25 MG/ML IJ SOLN
6.2500 mg | INTRAMUSCULAR | Status: DC | PRN
Start: 2017-09-06 — End: 2017-09-06

## 2017-09-06 MED ORDER — CHLORHEXIDINE GLUCONATE 4 % EX LIQD: 60.0000 mL | Freq: Once | CUTANEOUS | Status: DC

## 2017-09-06 MED ORDER — CEFAZOLIN SODIUM-DEXTROSE 2-4 GM/100ML-% IV SOLN
2.0000 g | INTRAVENOUS | Status: AC
  Administered 2017-09-06: 2 g via INTRAVENOUS

## 2017-09-06 MED ORDER — ASPIRIN EC 81 MG PO TBEC: 81.0000 mg | DELAYED_RELEASE_TABLET | Freq: Two times a day (BID) | ORAL | 0 refills | Status: DC

## 2017-09-06 MED ORDER — SODIUM CHLORIDE 0.9 % IV SOLN: INTRAVENOUS | Status: DC

## 2017-09-06 MED ORDER — FENTANYL CITRATE (PF) 100 MCG/2ML IJ SOLN
50.0000 ug | INTRAMUSCULAR | Status: DC | PRN
  Administered 2017-09-06: 100 ug via INTRAVENOUS

## 2017-09-06 MED ORDER — SENNA 8.6 MG PO TABS: 2.0000 | ORAL_TABLET | Freq: Two times a day (BID) | ORAL | 0 refills | Status: DC

## 2017-09-06 MED ORDER — DEXAMETHASONE SODIUM PHOSPHATE 10 MG/ML IJ SOLN
INTRAMUSCULAR | Status: DC | PRN
  Administered 2017-09-06: 10 mg via INTRAVENOUS

## 2017-09-06 MED ORDER — DOCUSATE SODIUM 100 MG PO CAPS: 100.0000 mg | ORAL_CAPSULE | Freq: Two times a day (BID) | ORAL | 0 refills | Status: DC

## 2017-09-06 MED ORDER — MIDAZOLAM HCL 2 MG/2ML IJ SOLN: 1.0000 mg | INTRAMUSCULAR | Status: DC | PRN

## 2017-09-06 MED ORDER — LIDOCAINE HCL (CARDIAC) 20 MG/ML IV SOLN
INTRAVENOUS | Status: DC | PRN
  Administered 2017-09-06: 30 mg via INTRAVENOUS

## 2017-09-06 MED ORDER — PROPOFOL 10 MG/ML IV BOLUS
INTRAVENOUS | Status: DC | PRN
  Administered 2017-09-06: 120 mg via INTRAVENOUS

## 2017-09-06 MED ORDER — ONDANSETRON HCL 4 MG/2ML IJ SOLN
INTRAMUSCULAR | Status: DC | PRN
  Administered 2017-09-06: 4 mg via INTRAVENOUS

## 2017-09-06 MED ORDER — OXYCODONE HCL 5 MG PO TABS: 5.0000 mg | ORAL_TABLET | ORAL | 0 refills | Status: DC | PRN

## 2017-09-06 MED ORDER — FENTANYL CITRATE (PF) 100 MCG/2ML IJ SOLN: 25.0000 ug | INTRAMUSCULAR | Status: DC | PRN

## 2017-09-06 SURGICAL SUPPLY — 67 items
BANDAGE ESMARK 6X9 LF (GAUZE/BANDAGES/DRESSINGS) IMPLANT
BIT DRILL 2.4 AO COUPLING CANN (BIT) ×2 IMPLANT
BLADE AVERAGE 25X9 (BLADE) IMPLANT
BLADE LONG MED 25X9 (BLADE) ×2 IMPLANT
BLADE MICRO SAGITTAL (BLADE) ×1 IMPLANT
BLADE SURG 15 STRL LF DISP TIS (BLADE) ×2 IMPLANT
BLADE SURG 15 STRL SS (BLADE) ×4
BNDG CMPR 9X6 STRL LF SNTH (GAUZE/BANDAGES/DRESSINGS)
BNDG COHESIVE 4X5 TAN STRL (GAUZE/BANDAGES/DRESSINGS) ×2 IMPLANT
BNDG COHESIVE 6X5 TAN STRL LF (GAUZE/BANDAGES/DRESSINGS) ×1 IMPLANT
BNDG CONFORM 3 STRL LF (GAUZE/BANDAGES/DRESSINGS) ×2 IMPLANT
BNDG ESMARK 6X9 LF (GAUZE/BANDAGES/DRESSINGS)
CHLORAPREP W/TINT 26ML (MISCELLANEOUS) ×2 IMPLANT
COVER BACK TABLE 60X90IN (DRAPES) ×2 IMPLANT
CUFF TOURNIQUET SINGLE 24IN (TOURNIQUET CUFF) ×1 IMPLANT
CUFF TOURNIQUET SINGLE 34IN LL (TOURNIQUET CUFF) IMPLANT
DRAPE EXTREMITY T 121X128X90 (DRAPE) ×2 IMPLANT
DRAPE OEC MINIVIEW 54X84 (DRAPES) ×2 IMPLANT
DRAPE U-SHAPE 47X51 STRL (DRAPES) ×2 IMPLANT
DRSG MEPITEL 4X7.2 (GAUZE/BANDAGES/DRESSINGS) ×2 IMPLANT
DRSG PAD ABDOMINAL 8X10 ST (GAUZE/BANDAGES/DRESSINGS) ×2 IMPLANT
ELECT REM PT RETURN 9FT ADLT (ELECTROSURGICAL) ×2
ELECTRODE REM PT RTRN 9FT ADLT (ELECTROSURGICAL) ×1 IMPLANT
GAUZE SPONGE 4X4 12PLY STRL (GAUZE/BANDAGES/DRESSINGS) ×2 IMPLANT
GLOVE BIO SURGEON STRL SZ8 (GLOVE) ×2 IMPLANT
GLOVE BIOGEL PI IND STRL 7.0 (GLOVE) IMPLANT
GLOVE BIOGEL PI IND STRL 8 (GLOVE) ×2 IMPLANT
GLOVE BIOGEL PI INDICATOR 7.0 (GLOVE) ×2
GLOVE BIOGEL PI INDICATOR 8 (GLOVE) ×2
GLOVE ECLIPSE 6.5 STRL STRAW (GLOVE) ×1 IMPLANT
GLOVE ECLIPSE 8.0 STRL XLNG CF (GLOVE) ×2 IMPLANT
GOWN STRL REUS W/ TWL LRG LVL3 (GOWN DISPOSABLE) ×1 IMPLANT
GOWN STRL REUS W/ TWL XL LVL3 (GOWN DISPOSABLE) ×2 IMPLANT
GOWN STRL REUS W/TWL LRG LVL3 (GOWN DISPOSABLE) ×2
GOWN STRL REUS W/TWL XL LVL3 (GOWN DISPOSABLE) ×4
KIT ARCUS SIZING TEMPLATE STRL (KITS) IMPLANT
NEEDLE HYPO 25X1 1.5 SAFETY (NEEDLE) IMPLANT
NS IRRIG 1000ML POUR BTL (IV SOLUTION) ×2 IMPLANT
PACK BASIN DAY SURGERY FS (CUSTOM PROCEDURE TRAY) ×2 IMPLANT
PAD CAST 4YDX4 CTTN HI CHSV (CAST SUPPLIES) ×1 IMPLANT
PADDING CAST ABS 4INX4YD NS (CAST SUPPLIES)
PADDING CAST ABS COTTON 4X4 ST (CAST SUPPLIES) IMPLANT
PADDING CAST COTTON 4X4 STRL (CAST SUPPLIES) ×2
PENCIL BUTTON HOLSTER BLD 10FT (ELECTRODE) ×2 IMPLANT
SANITIZER HAND PURELL 535ML FO (MISCELLANEOUS) ×2 IMPLANT
SCREW CANN PT 4.0X34 (Screw) ×2 IMPLANT
SCREW CANN PT 4X34 NS (Screw) IMPLANT
SCREW LP 3.5X44 (Screw) ×1 IMPLANT
SHEET MEDIUM DRAPE 40X70 STRL (DRAPES) ×2 IMPLANT
SLEEVE SCD COMPRESS KNEE MED (MISCELLANEOUS) ×2 IMPLANT
SPONGE LAP 18X18 RF (DISPOSABLE) ×2 IMPLANT
STOCKINETTE 6  STRL (DRAPES) ×1
STOCKINETTE 6 STRL (DRAPES) ×1 IMPLANT
SUCTION FRAZIER HANDLE 10FR (MISCELLANEOUS) ×1
SUCTION TUBE FRAZIER 10FR DISP (MISCELLANEOUS) ×1 IMPLANT
SUT ETHIBOND 3-0 V-5 (SUTURE) IMPLANT
SUT ETHILON 3 0 PS 1 (SUTURE) ×2 IMPLANT
SUT MNCRL AB 3-0 PS2 18 (SUTURE) ×2 IMPLANT
SUT VIC AB 0 SH 27 (SUTURE) IMPLANT
SUT VIC AB 2-0 SH 27 (SUTURE) ×2
SUT VIC AB 2-0 SH 27XBRD (SUTURE) IMPLANT
SUT VICRYL 0 UR6 27IN ABS (SUTURE) IMPLANT
SYR BULB 3OZ (MISCELLANEOUS) ×2 IMPLANT
SYR CONTROL 10ML LL (SYRINGE) IMPLANT
TOWEL OR 17X24 6PK STRL BLUE (TOWEL DISPOSABLE) ×3 IMPLANT
TUBE CONNECTING 20X1/4 (TUBING) ×2 IMPLANT
UNDERPAD 30X30 (UNDERPADS AND DIAPERS) ×2 IMPLANT

## 2017-09-06 NOTE — Transfer of Care (Signed)
Immediate Anesthesia Transfer of Care Note  Patient: Joury Allcorn  Procedure(s) Performed: Right Lapidus, Modified McBride Bunionectomy and Second Metatarsal Osteotomy (Right Foot)  Patient Location: PACU  Anesthesia Type:GA combined with regional for post-op pain  Level of Consciousness: awake and patient cooperative  Airway & Oxygen Therapy: Patient Spontanous Breathing and Patient connected to face mask oxygen  Post-op Assessment: Report given to RN and Post -op Vital signs reviewed and stable  Post vital signs: Reviewed and stable  Last Vitals:  Vitals Value Taken Time  BP 130/58 09/06/2017 12:06 PM  Temp    Pulse 100 09/06/2017 12:07 PM  Resp 18 09/06/2017 12:07 PM  SpO2 100 % 09/06/2017 12:07 PM  Vitals shown include unvalidated device data.  Last Pain:  Vitals:   09/06/17 1050  TempSrc:   PainSc: 0-No pain      Patients Stated Pain Goal: 3 (20/94/70 9628)  Complications: No apparent anesthesia complications

## 2017-09-06 NOTE — Progress Notes (Signed)
Assisted Dr. Oddono with right, ultrasound guided, popliteal block. Side rails up, monitors on throughout procedure. See vital signs in flow sheet. Tolerated Procedure well. 

## 2017-09-06 NOTE — Anesthesia Postprocedure Evaluation (Signed)
Anesthesia Post Note  Patient: Heather Lewis  Procedure(s) Performed: Right Lapidus, Modified McBride Bunionectomy (Right Foot)     Patient location during evaluation: PACU Anesthesia Type: General Level of consciousness: awake and alert Pain management: pain level controlled Vital Signs Assessment: post-procedure vital signs reviewed and stable Respiratory status: spontaneous breathing, nonlabored ventilation, respiratory function stable and patient connected to nasal cannula oxygen Cardiovascular status: blood pressure returned to baseline and stable Postop Assessment: no apparent nausea or vomiting Anesthetic complications: no    Last Vitals:  Vitals Value Taken Time  BP    Temp    Pulse 94 09/06/2017 12:45 PM  Resp 18 09/06/2017 12:45 PM  SpO2 100 % 09/06/2017 12:45 PM  Vitals shown include unvalidated device data.  Last Pain:  Vitals:   09/06/17 1215  TempSrc:   PainSc: Asleep                 Shardea Cwynar

## 2017-09-06 NOTE — Anesthesia Procedure Notes (Signed)
Procedure Name: LMA Insertion Date/Time: 09/06/2017 11:14 AM Performed by: Signe Colt, CRNA Pre-anesthesia Checklist: Patient identified, Emergency Drugs available, Suction available and Patient being monitored Patient Re-evaluated:Patient Re-evaluated prior to induction Oxygen Delivery Method: Circle system utilized Preoxygenation: Pre-oxygenation with 100% oxygen Induction Type: IV induction Ventilation: Mask ventilation without difficulty LMA: LMA inserted LMA Size: 4.0 Number of attempts: 1 Airway Equipment and Method: Bite block Placement Confirmation: positive ETCO2 Tube secured with: Tape Dental Injury: Teeth and Oropharynx as per pre-operative assessment

## 2017-09-06 NOTE — Discharge Instructions (Addendum)
Heather Simmer, MD [place]  Please read the following information regarding your care after surgery.  Medications  You only need a prescription for the narcotic pain medicine (ex. oxycodone, Percocet, Norco).  All of the other medicines listed below are available over the counter. X Aleve 2 pills twice a day for the first 3 days after surgery.  Or you may resume your Ibuprofen you have at home. X acetominophen (Tylenol) 650 mg every 4-6 hours as you need for minor to moderate pain X oxycodone as prescribed for severe pain  Narcotic pain medicine (ex. oxycodone, Percocet, Vicodin) will cause constipation.  To prevent this problem, take the following medicines while you are taking any pain medicine. X docusate sodium (Colace) 100 mg twice a day X senna (Senokot) 2 tablets twice a day  X To help prevent blood clots, take a baby aspirin (81 mg) twice a day after surgery.  You should also get up every hour while you are awake to move around.    Weight Bearing X Do not bear any weight on the operated leg or foot.  Cast / Splint / Dressing X Keep your splint, cast or dressing clean and dry.  Dont put anything (coat hanger, pencil, etc) down inside of it.  If it gets damp, use a hair dryer on the cool setting to dry it.  If it gets soaked, call the office to schedule an appointment for a cast change.  After your dressing, cast or splint is removed; you may shower, but do not soak or scrub the wound.  Allow the water to run over it, and then gently pat it dry.  Swelling It is normal for you to have swelling where you had surgery.  To reduce swelling and pain, keep your toes above your nose for at least 3 days after surgery.  It may be necessary to keep your foot or leg elevated for several weeks.  If it hurts, it should be elevated.  Follow Up Call my office at [phone] when you are discharged from the hospital or surgery center to schedule an appointment to be seen two weeks  after surgery.  Call my office at [phone] if you develop a fever >101.5 F, nausea, vomiting, bleeding from the surgical site or severe pain.          Post Anesthesia Home Care Instructions  Activity: Get plenty of rest for the remainder of the day. A responsible individual must stay with you for 24 hours following the procedure.  For the next 24 hours, DO NOT: -Drive a car -Paediatric nurse -Drink alcoholic beverages -Take any medication unless instructed by your physician -Make any legal decisions or sign important papers.  Meals: Start with liquid foods such as gelatin or soup. Progress to regular foods as tolerated. Avoid greasy, spicy, heavy foods. If nausea and/or vomiting occur, drink only clear liquids until the nausea and/or vomiting subsides. Call your physician if vomiting continues.  Special Instructions/Symptoms: Your throat may feel dry or sore from the anesthesia or the breathing tube placed in your throat during surgery. If this causes discomfort, gargle with warm salt water. The discomfort should disappear within 24 hours.  If you had a scopolamine patch placed behind your ear for the management of post- operative nausea and/or vomiting:  1. The medication in the patch is effective for 72 hours, after which it should be removed.  Wrap patch in a tissue and discard in the trash. Wash hands thoroughly with soap and water.  2. You may remove the patch earlier than 72 hours if you experience unpleasant side effects which may include dry mouth, dizziness or visual disturbances. 3. Avoid touching the patch. Wash your hands with soap and water after contact with the patch.        Regional Anesthesia Blocks  1. Numbness or the inability to move the "blocked" extremity may last from 3-48 hours after placement. The length of time depends on the medication injected and your individual response to the medication. If the numbness is not going away after 48 hours, call  your surgeon.  2. The extremity that is blocked will need to be protected until the numbness is gone and the  Strength has returned. Because you cannot feel it, you will need to take extra care to avoid injury. Because it may be weak, you may have difficulty moving it or using it. You may not know what position it is in without looking at it while the block is in effect.  3. For blocks in the legs and feet, returning to weight bearing and walking needs to be done carefully. You will need to wait until the numbness is entirely gone and the strength has returned. You should be able to move your leg and foot normally before you try and bear weight or walk. You will need someone to be with you when you first try to ensure you do not fall and possibly risk injury.  4. Bruising and tenderness at the needle site are common side effects and will resolve in a few days.  5. Persistent numbness or new problems with movement should be communicated to the surgeon or the [place] [phone] [place] [phone]).

## 2017-09-06 NOTE — Anesthesia Preprocedure Evaluation (Signed)
Anesthesia Evaluation  Patient identified by MRN, date of birth, ID band Patient awake  General Assessment Comment:Complication of anesthesia  hard to wake up from colonoscopy when Propofol not used   Reviewed: Allergy & Precautions, NPO status , Patient's Chart, lab work & pertinent test results  History of Anesthesia Complications (+) PROLONGED EMERGENCE and history of anesthetic complications  Airway Mallampati: II  TM Distance: >3 FB Neck ROM: Full    Dental no notable dental hx.    Pulmonary neg pulmonary ROS,    Pulmonary exam normal breath sounds clear to auscultation       Cardiovascular negative cardio ROS Normal cardiovascular exam Rhythm:Regular Rate:Normal     Neuro/Psych negative neurological ROS  negative psych ROS   GI/Hepatic negative GI ROS, Neg liver ROS,   Endo/Other  negative endocrine ROS  Renal/GU negative Renal ROS  negative genitourinary   Musculoskeletal negative musculoskeletal ROS (+)   Abdominal   Peds negative pediatric ROS (+)  Hematology negative hematology ROS (+)   Anesthesia Other Findings   Reproductive/Obstetrics negative OB ROS                             Anesthesia Physical Anesthesia Plan  ASA: III  Anesthesia Plan: General   Post-op Pain Management: GA combined w/ Regional for post-op pain   Induction: Intravenous  PONV Risk Score and Plan: 3 and Ondansetron, Treatment may vary due to age or medical condition and Dexamethasone  Airway Management Planned: LMA  Additional Equipment:   Intra-op Plan:   Post-operative Plan: Extubation in OR  Informed Consent: I have reviewed the patients History and Physical, chart, labs and discussed the procedure including the risks, benefits and alternatives for the proposed anesthesia with the patient or authorized representative who has indicated his/her understanding and acceptance.   Dental  Advisory Given  Plan Discussed with: CRNA, Surgeon and Anesthesiologist  Anesthesia Plan Comments:         Anesthesia Quick Evaluation

## 2017-09-06 NOTE — H&P (Signed)
Heather Lewis is an 72 y.o. female.   Chief Complaint:  Right foot pain HPI: The patient is a 72 year old female with a long history of right forefoot pain due to a prominent bunion deformity.  She has failed nonoperative treatment to date including activity modification, oral anti-inflammatories and shoewear modifications.  She presents today for operative treatment of this painful and limiting condition.   Past Medical History:  Diagnosis Date  . Age-related macular degeneration   . Bunion of right foot 08/2017  . Complication of anesthesia    hard to wake up from colonoscopy when Propofol not used  . Dental crowns present   . Metatarsalgia of right foot 08/2017   2nd toe  . Migraines    with change of weather  . TMJ syndrome     Past Surgical History:  Procedure Laterality Date  . APPENDECTOMY  1960  . COLONOSCOPY WITH PROPOFOL  08/08/2016  . TONSILLECTOMY AND ADENOIDECTOMY  1954    Family History  Problem Relation Age of Onset  . Colon polyps Mother   . Colon polyps Father   . Colon polyps Brother    Social History:  reports that she has never smoked. She has never used smokeless tobacco. She reports that she drinks alcohol. She reports that she does not use drugs.  Allergies:  Allergies  Allergen Reactions  . Nitrates, Organic Other (See Comments)    MIGRAINES    Medications Prior to Admission  Medication Sig Dispense Refill  . cholecalciferol (VITAMIN D) 1000 units tablet Take 1,000 Units by mouth daily.    . Coenzyme Q10 (COQ10 PO) Take by mouth.    Marland Kitchen ibuprofen (ADVIL,MOTRIN) 800 MG tablet Take 800 mg by mouth every 8 (eight) hours as needed. migraines     . TURMERIC PO Take by mouth.      No results found for this or any previous visit (from the past 48 hour(s)). No results found.  ROS no recent fever, chills, nausea, vomiting or changes in her appetite  Blood pressure 130/74, pulse 84, temperature (!) 97.4 F (36.3 C), temperature source Oral, resp. rate  (!) 21, height 5\' 3"  (1.6 m), weight 56.2 kg (124 lb), SpO2 100 %. Physical Exam  Well-nourished well-developed woman in no apparent distress.  Alert and oriented x4.  Mood and affect are normal.  Extraocular motions are intact.  Respirations are unlabored.  Gait is normal.  Right foot has a prominent bunion deformity.  She is tender to palpation over the medial eminence.  Skin is otherwise healthy and intact.  Pulses are palpable.  No lymphadenopathy.  5 out of 5 strength in plantar flexion and dorsiflexion of the ankle and toes.  Sensibility to light touch is intact dorsally and plantarly at the forefoot.  Assessment/Plan Painful right foot bunion deformity -to the operating room for Lapidus arthrodesis of the first tarsometatarsal joint and modified McBride bunionectomy.  The risks and benefits of the alternative treatment options have been discussed in detail.  The patient wishes to proceed with surgery and specifically understands risks of bleeding, infection, nerve damage, blood clots, need for additional surgery, amputation and death.   Wylene Simmer, MD 10/03/2017, 11:06 AM

## 2017-09-06 NOTE — Op Note (Signed)
09/06/2017  12:14 PM  PATIENT:  Heather Lewis  72 y.o. female  PRE-OPERATIVE DIAGNOSIS: Right foot bunion  POST-OPERATIVE DIAGNOSIS: Same  Procedure(s):  1. right foot modified McBride bunionectomy through a separate incision    2.  Right Lapidus arthrodesis of the first tarsometatarsal joint    3.  AP, oblique and lateral radiographs of the right foot   SURGEON:  Wylene Simmer, MD  ASSISTANT: Mechele Claude, PA-C  ANESTHESIA:   General, regional  EBL:  minimal   TOURNIQUET:   Total Tourniquet Time Documented: Thigh (Right) - 27 minutes Total: Thigh (Right) - 27 minutes   COMPLICATIONS:  None apparent  DISPOSITION:  Extubated, awake and stable to recovery.  INDICATION FOR PROCEDURE: The patient is a 72 year old female without significant past medical history.  She has a painful bunion deformity that is limiting her activities and shoe wear.  She has failed nonoperative treatment to date and presents for surgical correction of this painful and limiting condition.  The risks and benefits of the alternative treatment options have been discussed in detail.  The patient wishes to proceed with surgery and specifically understands risks of bleeding, infection, nerve damage, blood clots, need for additional surgery, amputation and death.  PROCEDURE IN DETAIL:  After pre operative consent was obtained, and the correct operative site was identified, the patient was brought to the operating room and placed supine on the OR table.  Anesthesia was administered.  Pre-operative antibiotics were administered.  A surgical timeout was taken.  The right lower extremity was prepped and draped in standard sterile fashion with a tourniquet around the thigh.  The extremity was elevated and the tourniquet inflated to 250 mmHg.  A longitudinal incision was then made over the dorsum of the first webspace.  Dissection was carried down to the simultaneous tissues.  The intermetatarsal ligament was divided under  direct vision.  An arthrotomy was then made between the lateral sesamoid and the metatarsal head.  Small perforations were made in the lateral joint capsule.  The hallux could then be positioned in 20 degrees of varus passively.  Attention was then turned to the medial eminence where a longitudinal incision was made.  Dissection was carried down through the subcutaneous tissues to the medial joint capsule.  It was incised in line with its fibers and elevated plantarly and dorsally.  The hypertrophic medial eminence was then resected in line with the first metatarsal shaft using an oscillating saw.  Attention was then turned to the dorsum of the midfoot where a longitudinal incision was made over the first tarsometatarsal joint.  Dissection was carried down through the subcutaneous tissues.  Care was taken to protect the EHL tendon.  The joint capsule was incised and elevated medially and laterally.  An oscillating saw was then used to resect the remaining articular cartilage and subchondral bone from both sides of the joint.  More bone was taken laterally than medially in order to correct the intermetatarsal angle.  The wound was irrigated copiously.  Drill bit was used to perforate both sides of the joint leaving the resultant bone graft in place.  The joint was reduced and provisionally pinned.  Radiographs showed appropriate correction of the intermetatarsal and hallux valgus angles.  The joint was then compressed with a 4 mm partially-threaded cannulated screw from [employer].  A second screw was then placed from distal to proximal across the TMT joint.  This was a 3.5 mm [employer] LPS screw.  AP, oblique and lateral radiographs showed  appropriate position and length of all hardware in appropriate correction of the bunion deformity.  The wounds were irrigated copiously and closed with Monocryl and nylon after imbricating the medial joint capsule with 2-0 Vicryl.  The second metatarsal head was no longer  prominent.  There is no instability noted at the second MTP joint.  There is no evidence of hammertoe deformity.  Sterile dressings were applied followed by a well-padded short leg splint.  The tourniquet was released after application of the dressings.  The patient was awakened from anesthesia and transported to the recovery room in stable condition.   FOLLOW UP PLAN: Nonweightbearing on the right lower extremity for 6 weeks.  Follow-up in the office in 2 weeks for suture removal and conversion to a short leg cast.  Aspirin 81 mg p.o. twice daily for DVT prophylaxis.   RADIOGRAPHS: AP, oblique and lateral radiographs of the right foot are obtained intraoperatively.  These show interval correction of the bunion deformity and arthrodesis of the first tarsometatarsal joint.  Hardware is appropriately positioned and of the appropriate lengths.    Mechele Claude PA-C was present and scrubbed for the duration of the operative case. His assistance was essential in positioning the patient, prepping and draping, gaining and maintaining exposure, performing the operation, closing and dressing the wounds and applying the splint.

## 2017-09-10 ENCOUNTER — Encounter (HOSPITAL_BASED_OUTPATIENT_CLINIC_OR_DEPARTMENT_OTHER): Payer: Self-pay | Admitting: Orthopedic Surgery

## 2017-09-19 DIAGNOSIS — M7741 Metatarsalgia, right foot: Secondary | ICD-10-CM | POA: Diagnosis not present

## 2017-09-19 DIAGNOSIS — Z4789 Encounter for other orthopedic aftercare: Secondary | ICD-10-CM | POA: Diagnosis not present

## 2017-10-15 DIAGNOSIS — H353131 Nonexudative age-related macular degeneration, bilateral, early dry stage: Secondary | ICD-10-CM | POA: Diagnosis not present

## 2017-10-17 DIAGNOSIS — Z4789 Encounter for other orthopedic aftercare: Secondary | ICD-10-CM | POA: Diagnosis not present

## 2017-10-17 DIAGNOSIS — M79671 Pain in right foot: Secondary | ICD-10-CM | POA: Diagnosis not present

## 2017-11-19 DIAGNOSIS — M79671 Pain in right foot: Secondary | ICD-10-CM | POA: Diagnosis not present

## 2017-11-19 DIAGNOSIS — Z4789 Encounter for other orthopedic aftercare: Secondary | ICD-10-CM | POA: Diagnosis not present

## 2017-11-19 DIAGNOSIS — M21619 Bunion of unspecified foot: Secondary | ICD-10-CM | POA: Diagnosis not present

## 2017-12-06 DIAGNOSIS — M25674 Stiffness of right foot, not elsewhere classified: Secondary | ICD-10-CM | POA: Diagnosis not present

## 2017-12-07 DIAGNOSIS — Z86018 Personal history of other benign neoplasm: Secondary | ICD-10-CM | POA: Diagnosis not present

## 2017-12-07 DIAGNOSIS — D224 Melanocytic nevi of scalp and neck: Secondary | ICD-10-CM | POA: Diagnosis not present

## 2017-12-07 DIAGNOSIS — D485 Neoplasm of uncertain behavior of skin: Secondary | ICD-10-CM | POA: Diagnosis not present

## 2017-12-07 DIAGNOSIS — D2272 Melanocytic nevi of left lower limb, including hip: Secondary | ICD-10-CM | POA: Diagnosis not present

## 2017-12-07 DIAGNOSIS — D229 Melanocytic nevi, unspecified: Secondary | ICD-10-CM | POA: Diagnosis not present

## 2017-12-07 DIAGNOSIS — Z808 Family history of malignant neoplasm of other organs or systems: Secondary | ICD-10-CM | POA: Diagnosis not present

## 2017-12-07 DIAGNOSIS — D225 Melanocytic nevi of trunk: Secondary | ICD-10-CM | POA: Diagnosis not present

## 2017-12-07 DIAGNOSIS — D2230 Melanocytic nevi of unspecified part of face: Secondary | ICD-10-CM | POA: Diagnosis not present

## 2017-12-19 DIAGNOSIS — M21612 Bunion of left foot: Secondary | ICD-10-CM | POA: Diagnosis not present

## 2017-12-31 DIAGNOSIS — L7682 Other postprocedural complications of skin and subcutaneous tissue: Secondary | ICD-10-CM | POA: Diagnosis not present

## 2017-12-31 DIAGNOSIS — D485 Neoplasm of uncertain behavior of skin: Secondary | ICD-10-CM | POA: Diagnosis not present

## 2018-02-25 DIAGNOSIS — R69 Illness, unspecified: Secondary | ICD-10-CM | POA: Diagnosis not present

## 2018-03-18 DIAGNOSIS — Z23 Encounter for immunization: Secondary | ICD-10-CM | POA: Diagnosis not present

## 2018-03-18 DIAGNOSIS — R300 Dysuria: Secondary | ICD-10-CM | POA: Diagnosis not present

## 2018-04-01 ENCOUNTER — Other Ambulatory Visit: Payer: Self-pay | Admitting: Internal Medicine

## 2018-04-01 DIAGNOSIS — Z1231 Encounter for screening mammogram for malignant neoplasm of breast: Secondary | ICD-10-CM

## 2018-04-29 ENCOUNTER — Other Ambulatory Visit (HOSPITAL_COMMUNITY): Payer: Self-pay | Admitting: Orthopedic Surgery

## 2018-05-13 ENCOUNTER — Ambulatory Visit
Admission: RE | Admit: 2018-05-13 | Discharge: 2018-05-13 | Disposition: A | Discharge status: Home / Self Care | Payer: Medicare HMO | Source: Ambulatory Visit | Attending: Internal Medicine | Admitting: Internal Medicine

## 2018-05-13 DIAGNOSIS — E559 Vitamin D deficiency, unspecified: Secondary | ICD-10-CM | POA: Diagnosis not present

## 2018-05-13 DIAGNOSIS — Z1231 Encounter for screening mammogram for malignant neoplasm of breast: Secondary | ICD-10-CM

## 2018-05-13 DIAGNOSIS — Z79899 Other long term (current) drug therapy: Secondary | ICD-10-CM | POA: Diagnosis not present

## 2018-05-13 DIAGNOSIS — R82998 Other abnormal findings in urine: Secondary | ICD-10-CM | POA: Diagnosis not present

## 2018-05-21 DIAGNOSIS — M545 Low back pain: Secondary | ICD-10-CM | POA: Diagnosis not present

## 2018-05-21 DIAGNOSIS — G4709 Other insomnia: Secondary | ICD-10-CM | POA: Diagnosis not present

## 2018-05-21 DIAGNOSIS — Z1389 Encounter for screening for other disorder: Secondary | ICD-10-CM | POA: Diagnosis not present

## 2018-05-21 DIAGNOSIS — Z0000 Encounter for general adult medical examination without abnormal findings: Secondary | ICD-10-CM | POA: Diagnosis not present

## 2018-05-21 DIAGNOSIS — R69 Illness, unspecified: Secondary | ICD-10-CM | POA: Diagnosis not present

## 2018-05-21 DIAGNOSIS — M859 Disorder of bone density and structure, unspecified: Secondary | ICD-10-CM | POA: Diagnosis not present

## 2018-05-21 DIAGNOSIS — Z801 Family history of malignant neoplasm of trachea, bronchus and lung: Secondary | ICD-10-CM | POA: Diagnosis not present

## 2018-05-21 DIAGNOSIS — E559 Vitamin D deficiency, unspecified: Secondary | ICD-10-CM | POA: Diagnosis not present

## 2018-05-21 DIAGNOSIS — G43909 Migraine, unspecified, not intractable, without status migrainosus: Secondary | ICD-10-CM | POA: Diagnosis not present

## 2018-05-21 DIAGNOSIS — D126 Benign neoplasm of colon, unspecified: Secondary | ICD-10-CM | POA: Diagnosis not present

## 2018-05-23 DIAGNOSIS — Z1212 Encounter for screening for malignant neoplasm of rectum: Secondary | ICD-10-CM | POA: Diagnosis not present

## 2018-06-18 ENCOUNTER — Other Ambulatory Visit: Payer: Self-pay

## 2018-06-18 ENCOUNTER — Encounter (HOSPITAL_BASED_OUTPATIENT_CLINIC_OR_DEPARTMENT_OTHER): Payer: Self-pay | Admitting: *Deleted

## 2018-06-27 ENCOUNTER — Encounter (HOSPITAL_BASED_OUTPATIENT_CLINIC_OR_DEPARTMENT_OTHER): Payer: Self-pay | Admitting: *Deleted

## 2018-06-27 ENCOUNTER — Ambulatory Visit (HOSPITAL_BASED_OUTPATIENT_CLINIC_OR_DEPARTMENT_OTHER)
Admission: RE | Admit: 2018-06-27 | Discharge: 2018-06-27 | Disposition: A | Discharge status: Home / Self Care | Payer: Medicare HMO | Attending: Orthopedic Surgery | Admitting: Orthopedic Surgery

## 2018-06-27 ENCOUNTER — Encounter (HOSPITAL_BASED_OUTPATIENT_CLINIC_OR_DEPARTMENT_OTHER)
Admission: RE | Disposition: A | Discharge status: Home / Self Care | Payer: Self-pay | Source: Home / Self Care | Attending: Orthopedic Surgery

## 2018-06-27 ENCOUNTER — Other Ambulatory Visit: Payer: Self-pay

## 2018-06-27 ENCOUNTER — Ambulatory Visit (HOSPITAL_BASED_OUTPATIENT_CLINIC_OR_DEPARTMENT_OTHER): Payer: Medicare HMO | Admitting: Certified Registered"

## 2018-06-27 DIAGNOSIS — M2042 Other hammer toe(s) (acquired), left foot: Secondary | ICD-10-CM | POA: Diagnosis not present

## 2018-06-27 DIAGNOSIS — G43909 Migraine, unspecified, not intractable, without status migrainosus: Secondary | ICD-10-CM | POA: Insufficient documentation

## 2018-06-27 DIAGNOSIS — G8918 Other acute postprocedural pain: Secondary | ICD-10-CM | POA: Diagnosis not present

## 2018-06-27 DIAGNOSIS — M7742 Metatarsalgia, left foot: Secondary | ICD-10-CM | POA: Insufficient documentation

## 2018-06-27 DIAGNOSIS — Z79899 Other long term (current) drug therapy: Secondary | ICD-10-CM | POA: Diagnosis not present

## 2018-06-27 DIAGNOSIS — M25375 Other instability, left foot: Secondary | ICD-10-CM | POA: Diagnosis not present

## 2018-06-27 DIAGNOSIS — M21612 Bunion of left foot: Secondary | ICD-10-CM | POA: Insufficient documentation

## 2018-06-27 HISTORY — PX: TARSAL METATARSAL ARTHRODESIS: SHX2481

## 2018-06-27 HISTORY — PX: HAMMERTOE RECONSTRUCTION WITH WEIL OSTEOTOMY: SHX5631

## 2018-06-27 SURGERY — FUSION, TARSOMETATARSAL JOINT
Anesthesia: Regional | Site: Foot | Laterality: Left

## 2018-06-27 MED ORDER — ASPIRIN EC 81 MG PO TBEC: 81.0000 mg | DELAYED_RELEASE_TABLET | Freq: Two times a day (BID) | ORAL | 0 refills | Status: DC

## 2018-06-27 MED ORDER — BUPIVACAINE HCL (PF) 0.5 % IJ SOLN
INTRAMUSCULAR | Status: DC | PRN
  Administered 2018-06-27: 20 mL via PERINEURAL

## 2018-06-27 MED ORDER — ONDANSETRON HCL 4 MG/2ML IJ SOLN
INTRAMUSCULAR | Status: DC | PRN
  Administered 2018-06-27: 4 mg via INTRAVENOUS

## 2018-06-27 MED ORDER — CEFAZOLIN SODIUM-DEXTROSE 2-4 GM/100ML-% IV SOLN
INTRAVENOUS | Status: AC
  Filled 2018-06-27: qty 100

## 2018-06-27 MED ORDER — PROPOFOL 10 MG/ML IV BOLUS
INTRAVENOUS | Status: DC | PRN
  Administered 2018-06-27: 100 mg via INTRAVENOUS

## 2018-06-27 MED ORDER — SENNA 8.6 MG PO TABS: 2.0000 | ORAL_TABLET | Freq: Two times a day (BID) | ORAL | 0 refills | Status: DC

## 2018-06-27 MED ORDER — BUPIVACAINE-EPINEPHRINE (PF) 0.25% -1:200000 IJ SOLN
INTRAMUSCULAR | Status: AC
  Filled 2018-06-27: qty 30

## 2018-06-27 MED ORDER — SCOPOLAMINE 1 MG/3DAYS TD PT72: 1.0000 | MEDICATED_PATCH | Freq: Once | TRANSDERMAL | Status: DC | PRN

## 2018-06-27 MED ORDER — CHLORHEXIDINE GLUCONATE 4 % EX LIQD: 60.0000 mL | Freq: Once | CUTANEOUS | Status: DC

## 2018-06-27 MED ORDER — EPHEDRINE SULFATE 50 MG/ML IJ SOLN
INTRAMUSCULAR | Status: DC | PRN
  Administered 2018-06-27: 10 mg via INTRAVENOUS

## 2018-06-27 MED ORDER — FENTANYL CITRATE (PF) 100 MCG/2ML IJ SOLN: 25.0000 ug | INTRAMUSCULAR | Status: DC | PRN

## 2018-06-27 MED ORDER — CLONIDINE HCL (ANALGESIA) 100 MCG/ML EP SOLN
EPIDURAL | Status: DC | PRN
  Administered 2018-06-27: 50 ug

## 2018-06-27 MED ORDER — OXYCODONE HCL 5 MG PO TABS: 5.0000 mg | ORAL_TABLET | ORAL | 0 refills | Status: AC | PRN

## 2018-06-27 MED ORDER — MIDAZOLAM HCL 2 MG/2ML IJ SOLN
INTRAMUSCULAR | Status: AC
  Filled 2018-06-27: qty 2

## 2018-06-27 MED ORDER — EPINEPHRINE PF 1 MG/10ML IJ SOSY
PREFILLED_SYRINGE | INTRAMUSCULAR | Status: DC | PRN
  Administered 2018-06-27: 100 ug via INTRAVENOUS

## 2018-06-27 MED ORDER — DEXAMETHASONE SODIUM PHOSPHATE 10 MG/ML IJ SOLN
INTRAMUSCULAR | Status: DC | PRN
  Administered 2018-06-27: 10 mg via INTRAVENOUS

## 2018-06-27 MED ORDER — FENTANYL CITRATE (PF) 100 MCG/2ML IJ SOLN
INTRAMUSCULAR | Status: AC
  Filled 2018-06-27: qty 2

## 2018-06-27 MED ORDER — CEFAZOLIN SODIUM-DEXTROSE 2-4 GM/100ML-% IV SOLN
2.0000 g | INTRAVENOUS | Status: AC
  Administered 2018-06-27: 2 g via INTRAVENOUS

## 2018-06-27 MED ORDER — PROPOFOL 500 MG/50ML IV EMUL
INTRAVENOUS | Status: DC | PRN
  Administered 2018-06-27: 25 ug/kg/min via INTRAVENOUS

## 2018-06-27 MED ORDER — LACTATED RINGERS IV SOLN
INTRAVENOUS | Status: DC
  Administered 2018-06-27 (×2): via INTRAVENOUS

## 2018-06-27 MED ORDER — MIDAZOLAM HCL 2 MG/2ML IJ SOLN
1.0000 mg | INTRAMUSCULAR | Status: DC | PRN
  Administered 2018-06-27: 2 mg via INTRAVENOUS

## 2018-06-27 MED ORDER — DOCUSATE SODIUM 100 MG PO CAPS: 100.0000 mg | ORAL_CAPSULE | Freq: Two times a day (BID) | ORAL | 0 refills | Status: DC

## 2018-06-27 MED ORDER — SODIUM CHLORIDE 0.9 % IV SOLN: INTRAVENOUS | Status: DC

## 2018-06-27 MED ORDER — FENTANYL CITRATE (PF) 100 MCG/2ML IJ SOLN: 50.0000 ug | INTRAMUSCULAR | Status: DC | PRN

## 2018-06-27 MED ORDER — 0.9 % SODIUM CHLORIDE (POUR BTL) OPTIME
TOPICAL | Status: DC | PRN
  Administered 2018-06-27: 1000 mL

## 2018-06-27 SURGICAL SUPPLY — 72 items
BANDAGE ESMARK 6X9 LF (GAUZE/BANDAGES/DRESSINGS) ×2 IMPLANT
BIT DRILL 2.4 AO COUPLING CANN (BIT) ×1 IMPLANT
BIT DRILL 3/32DIAX5INL DISPOSE (BIT) IMPLANT
BIT DRILL 3/32DX5IN DISP (BIT) ×2
BLADE AVERAGE 25X9 (BLADE) ×1 IMPLANT
BLADE MICRO SAGITTAL (BLADE) ×1 IMPLANT
BLADE OSC/SAG .038X5.5 CUT EDG (BLADE) IMPLANT
BLADE SURG 15 STRL LF DISP TIS (BLADE) ×4 IMPLANT
BLADE SURG 15 STRL SS (BLADE) ×6
BNDG CMPR 9X6 STRL LF SNTH (GAUZE/BANDAGES/DRESSINGS) ×2
BNDG COHESIVE 4X5 TAN STRL (GAUZE/BANDAGES/DRESSINGS) ×3 IMPLANT
BNDG COHESIVE 6X5 TAN STRL LF (GAUZE/BANDAGES/DRESSINGS) ×3 IMPLANT
BNDG CONFORM 2 STRL LF (GAUZE/BANDAGES/DRESSINGS) ×1 IMPLANT
BNDG ESMARK 6X9 LF (GAUZE/BANDAGES/DRESSINGS) ×3
CHLORAPREP W/TINT 26ML (MISCELLANEOUS) ×3 IMPLANT
COVER BACK TABLE 60X90IN (DRAPES) ×3 IMPLANT
COVER WAND RF STERILE (DRAPES) IMPLANT
CUFF TOURNIQUET SINGLE 34IN LL (TOURNIQUET CUFF) ×3 IMPLANT
DECANTER SPIKE VIAL GLASS SM (MISCELLANEOUS) IMPLANT
DRAPE EXTREMITY T 121X128X90 (DISPOSABLE) ×3 IMPLANT
DRAPE OEC MINIVIEW 54X84 (DRAPES) ×3 IMPLANT
DRAPE U-SHAPE 47X51 STRL (DRAPES) ×3 IMPLANT
DRILL BIT 3/32DIAX5INL DISPOSE (BIT) ×3
DRSG MEPITEL 4X7.2 (GAUZE/BANDAGES/DRESSINGS) ×3 IMPLANT
DRSG PAD ABDOMINAL 8X10 ST (GAUZE/BANDAGES/DRESSINGS) ×6 IMPLANT
ELECT REM PT RETURN 9FT ADLT (ELECTROSURGICAL) ×3
ELECTRODE REM PT RTRN 9FT ADLT (ELECTROSURGICAL) ×2 IMPLANT
GAUZE SPONGE 4X4 12PLY STRL (GAUZE/BANDAGES/DRESSINGS) ×3 IMPLANT
GLOVE BIO SURGEON STRL SZ8 (GLOVE) ×3 IMPLANT
GLOVE BIOGEL PI IND STRL 7.0 (GLOVE) IMPLANT
GLOVE BIOGEL PI IND STRL 8 (GLOVE) ×4 IMPLANT
GLOVE BIOGEL PI INDICATOR 7.0 (GLOVE) ×2
GLOVE BIOGEL PI INDICATOR 8 (GLOVE) ×2
GLOVE ECLIPSE 6.5 STRL STRAW (GLOVE) ×1 IMPLANT
GLOVE ECLIPSE 8.0 STRL XLNG CF (GLOVE) ×3 IMPLANT
GOWN STRL REUS W/ TWL LRG LVL3 (GOWN DISPOSABLE) ×2 IMPLANT
GOWN STRL REUS W/ TWL XL LVL3 (GOWN DISPOSABLE) ×4 IMPLANT
GOWN STRL REUS W/TWL LRG LVL3 (GOWN DISPOSABLE) ×3
GOWN STRL REUS W/TWL XL LVL3 (GOWN DISPOSABLE) ×6
K-WIRE TROC 1.25X150 (WIRE) ×3
KWIRE TROC 1.25X150 (WIRE) IMPLANT
NEEDLE HYPO 22GX1.5 SAFETY (NEEDLE) IMPLANT
PACK BASIN DAY SURGERY FS (CUSTOM PROCEDURE TRAY) ×3 IMPLANT
PAD CAST 4YDX4 CTTN HI CHSV (CAST SUPPLIES) ×2 IMPLANT
PADDING CAST ABS 4INX4YD NS (CAST SUPPLIES)
PADDING CAST ABS COTTON 4X4 ST (CAST SUPPLIES) IMPLANT
PADDING CAST COTTON 4X4 STRL (CAST SUPPLIES) ×3
PADDING CAST COTTON 6X4 STRL (CAST SUPPLIES) ×3 IMPLANT
PENCIL BUTTON HOLSTER BLD 10FT (ELECTRODE) ×3 IMPLANT
SANITIZER HAND PURELL 535ML FO (MISCELLANEOUS) ×3 IMPLANT
SCREW CANN 4.0X38MM (Screw) ×3 IMPLANT
SCREW CANN PT 4X38 NS (Screw) IMPLANT
SCREW CORT 3.5X40 (Screw) ×1 IMPLANT
SCREW HCS TWIST-OFF 2.0X12MM (Screw) ×1 IMPLANT
SHEET MEDIUM DRAPE 40X70 STRL (DRAPES) ×3 IMPLANT
SLEEVE SCD COMPRESS KNEE MED (MISCELLANEOUS) ×3 IMPLANT
SPLINT FAST PLASTER 5X30 (CAST SUPPLIES) ×20
SPLINT PLASTER CAST FAST 5X30 (CAST SUPPLIES) ×40 IMPLANT
SPONGE LAP 18X18 RF (DISPOSABLE) ×3 IMPLANT
STOCKINETTE 6  STRL (DRAPES) ×1
STOCKINETTE 6 STRL (DRAPES) ×2 IMPLANT
SUCTION FRAZIER HANDLE 10FR (MISCELLANEOUS) ×1
SUCTION TUBE FRAZIER 10FR DISP (MISCELLANEOUS) ×2 IMPLANT
SUT ETHILON 3 0 PS 1 (SUTURE) ×3 IMPLANT
SUT MNCRL AB 3-0 PS2 18 (SUTURE) ×3 IMPLANT
SUT VIC AB 0 SH 27 (SUTURE) IMPLANT
SUT VIC AB 2-0 SH 27 (SUTURE)
SUT VIC AB 2-0 SH 27XBRD (SUTURE) IMPLANT
SYR BULB 3OZ (MISCELLANEOUS) ×3 IMPLANT
TOWEL GREEN STERILE FF (TOWEL DISPOSABLE) ×6 IMPLANT
TUBE CONNECTING 20X1/4 (TUBING) ×3 IMPLANT
UNDERPAD 30X30 (UNDERPADS AND DIAPERS) ×3 IMPLANT

## 2018-06-27 NOTE — Anesthesia Procedure Notes (Signed)
Procedure Name: LMA Insertion Date/Time: 06/27/2018 10:42 AM Performed by: Signe Colt, CRNA Pre-anesthesia Checklist: Patient identified, Emergency Drugs available, Suction available and Patient being monitored Patient Re-evaluated:Patient Re-evaluated prior to induction Oxygen Delivery Method: Circle system utilized Preoxygenation: Pre-oxygenation with 100% oxygen Induction Type: IV induction Ventilation: Mask ventilation without difficulty LMA: LMA inserted LMA Size: 4.0 Number of attempts: 1 Airway Equipment and Method: Bite block Placement Confirmation: positive ETCO2 Tube secured with: Tape Dental Injury: Teeth and Oropharynx as per pre-operative assessment

## 2018-06-27 NOTE — Transfer of Care (Signed)
Immediate Anesthesia Transfer of Care Note  Patient: Heather Lewis  Procedure(s) Performed: Left modified Lauro Regulus and lapidus 1st tarsometatarsal arthrodesis (Left Foot) LEFT SECOND HAMMERTOE RECONSTRUCTION WITH WEIL OSTEOTOMY (Foot)  Patient Location: PACU  Anesthesia Type:GA combined with regional for post-op pain  Level of Consciousness: awake, alert , oriented and patient cooperative  Airway & Oxygen Therapy: Patient Spontanous Breathing and Patient connected to face mask oxygen  Post-op Assessment: Report given to RN and Post -op Vital signs reviewed and stable  Post vital signs: Reviewed and stable  Last Vitals:  Vitals Value Taken Time  BP 132/62 06/27/2018 11:48 AM  Temp    Pulse 91 06/27/2018 11:49 AM  Resp 9 06/27/2018 11:49 AM  SpO2 100 % 06/27/2018 11:49 AM  Vitals shown include unvalidated device data.  Last Pain:  Vitals:   06/27/18 0901  TempSrc: Oral         Complications: No apparent anesthesia complications

## 2018-06-27 NOTE — Progress Notes (Signed)
Assisted D. Woodrum with left, ultrasound guided, popliteal block. Side rails up, monitors on throughout procedure. See vital signs in flow sheet. Tolerated Procedure well.

## 2018-06-27 NOTE — H&P (Signed)
Heather Lewis is an 73 y.o. female.   Chief Complaint: Left foot pain HPI: The patient is a 73 year old female with a painful left foot bunion deformity as well as a left second hammertoe.  She has failed nonoperative treatment to date including activity modification, oral anti-inflammatories and shoewear modification.  She presents now for operative treatment of this painful left forefoot deformity.  Past Medical History:  Diagnosis Date  . Age-related macular degeneration   . Bunion of right foot 08/2017  . Complication of anesthesia    hard to wake up from colonoscopy when Propofol not used  . Dental crowns present   . Metatarsalgia of right foot 08/2017   2nd toe  . Migraines    with change of weather  . TMJ syndrome     Past Surgical History:  Procedure Laterality Date  . APPENDECTOMY  1960  . BUNIONECTOMY WITH WEIL OSTEOTOMY Right 09/06/2017   Procedure: Right Lapidus, Modified Alphonzo Grieve;  Surgeon: Wylene Simmer, MD;  Location: [facility];  Service: Orthopedics;  Laterality: Right;  . COLONOSCOPY WITH PROPOFOL  08/08/2016  . TONSILLECTOMY AND ADENOIDECTOMY  1954    Family History  Problem Relation Age of Onset  . Colon polyps Mother   . Colon polyps Father   . Colon polyps Brother    Social History:  reports that she has never smoked. She has never used smokeless tobacco. She reports current alcohol use. She reports that she does not use drugs.  Allergies:  Allergies  Allergen Reactions  . Nitrates, Organic Other (See Comments)    MIGRAINES    Medications Prior to Admission  Medication Sig Dispense Refill  . cholecalciferol (VITAMIN D) 1000 units tablet Take 1,000 Units by mouth daily.    . Coenzyme Q10 (COQ10 PO) Take by mouth.    Marland Kitchen ibuprofen (ADVIL,MOTRIN) 800 MG tablet Take 800 mg by mouth every 8 (eight) hours as needed. migraines     . TURMERIC PO Take by mouth.      No results found for this or any previous visit (from the past 48  hour(s)). No results found.  ROS no recent fever, chills, nausea, vomiting or changes in her appetite  Blood pressure 102/86, pulse 66, resp. rate 14, height 5\' 2"  (1.575 m), weight 53.5 kg, SpO2 100 %. Physical Exam  Well-nourished well-developed woman in no apparent distress.  Alert and oriented x4.  Mood and affect are normal.  Extraocular motions are intact.  Respirations are unlabored.  Gait is normal.  The left foot has a significant bunion deformity.  Second toe has a moderate hammertoe.  Skin is healthy and intact.  Pulses are palpable.  No lymphadenopathy.  Normal sensibility to light touch dorsally and plantarly at the forefoot.  5 out of 5 strength in plantar flexion dorsiflexion of the ankle and toes.  Assessment/Plan Painful left foot bunion deformity, metatarsalgia and second hammertoe -to the operating room today for modified McBride bunionectomy, Lapidus arthrodesis of the first TMT joint and second metatarsal Weil osteotomy and hammertoe correction.  The risks and benefits of the alternative treatment options have been discussed in detail.  The patient wishes to proceed with surgery and specifically understands risks of bleeding, infection, nerve damage, blood clots, need for additional surgery, amputation and death.   Wylene Simmer, MD 07-15-18, 10:29 AM

## 2018-06-27 NOTE — Anesthesia Postprocedure Evaluation (Signed)
Anesthesia Post Note  Patient: Ellon Marasco  Procedure(s) Performed: Left modified Lauro Regulus and lapidus 1st tarsometatarsal arthrodesis (Left Foot) LEFT SECOND HAMMERTOE RECONSTRUCTION WITH WEIL OSTEOTOMY (Foot)     Patient location during evaluation: PACU Anesthesia Type: General Level of consciousness: sedated Pain management: pain level controlled Vital Signs Assessment: post-procedure vital signs reviewed and stable Respiratory status: spontaneous breathing and respiratory function stable Cardiovascular status: stable Postop Assessment: no apparent nausea or vomiting Anesthetic complications: no    Last Vitals:  Vitals:   06/27/18 1215 06/27/18 1254  BP: 138/64 126/62  Pulse: 84 67  Resp: (!) 25   Temp:  36.5 C  SpO2: 100% 100%    Last Pain:  Vitals:   06/27/18 1254  TempSrc: Oral  PainSc: 0-No pain                 Emrie Gayle DANIEL

## 2018-06-27 NOTE — Op Note (Signed)
06/27/2018  11:52 AM  PATIENT:  Heather Lewis  73 y.o. 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  Left modified McBride bunionectomy 2.  Left first tarsometatarsal joint Lapidus arthrodesis 3.  Left second metatarsal Weil osteotomy 4.  Left foot AP, lateral and oblique radiographs  SURGEON:  Wylene Simmer, MD  ASSISTANT: Mechele Claude, PA-C  ANESTHESIA:   General, regional  EBL:  minimal   TOURNIQUET:   Total Tourniquet Time Documented: Thigh (Left) - 44 minutes Total: Thigh (Left) - 44 minutes   COMPLICATIONS:  None apparent  DISPOSITION:  Extubated, awake and stable to recovery.  INDICATION FOR PROCEDURE: The patient is a 73 year old female known to me from previous right foot bunion correction.  She has a prominent left foot bunion deformity as well as a painful left second hammertoe deformity.  She has failed nonoperative treatment to date including activity modification, oral anti-inflammatories and shoewear modification.  She presents now for operative treatment of this painful and limiting condition.  The risks and benefits of the alternative treatment options have been discussed in detail.  The patient wishes to proceed with surgery and specifically understands risks of bleeding, infection, nerve damage, blood clots, need for additional surgery, amputation and death.  PROCEDURE IN DETAIL:  After pre operative consent was obtained, and the correct operative site was identified, the patient was brought to the operating room and placed supine on the OR table.  Anesthesia was administered.  Pre-operative antibiotics were administered.  A surgical timeout was taken.  The left lower extremity was prepped and draped in standard sterile fashion with a tourniquet around the thigh.  The extremity was elevated and the tourniquet was inflated to 250 mmHg.  A longitudinal  incision was made at the dorsum of the first webspace.  Dissection was carried down through the subcutaneous tissues.  The intermetatarsal ligament was divided under direct vision.  An arthrotomy was then made between the lateral sesamoid and the metatarsal head.  Several perforations were made in the lateral joint capsule.  The hallux could then be positioned in slight varus passively.  Attention was turned to the dorsum of the midfoot where a longitudinal incision was made over the first TMT joint.  Dissection was carried down through the subcutaneous tissues.  The tibialis anterior and EHL tendons were protected.  The first TMT joint was opened.  The oscillating saw was used to remove both sides of the joint including the cartilage and subchondral bone.  More bone was taken laterally than medially in order to correct the intermetatarsal angle.  The wound was irrigated.  Both sides of the joint were perforated with a small drill bit leaving the resultant bone graft in place.  The joint was reduced correcting the intermetatarsal and hallux valgus angles.  The joint was then fixed with a 4 mm partially-threaded cannulated screw from the Biomet 4.0/5.0 screw set.  A 3.5 mm fully threaded LPS screw was then inserted from distal to proximal.  Both were noted to have excellent purchase.  AP and lateral radiographs confirmed appropriate alignment of the joint and appropriate length and position of both screws.  Attention was then turned to the second MTP joint.  The dorsal joint capsule was incised and elevated exposing the metatarsal head.  A Weil osteotomy was then made with the oscillating saw all removing a small wedge of bone.  The head of the metatarsal  was allowed to retract proximally several millimeters.  The osteotomy was then fixed with a 2 mm Biomet FRS screw.  Overhanging bone was trimmed with a rondure.  Final AP, lateral and oblique radiographs of the left foot show interval correction of the bunion  deformity and hammertoe deformity.  Hardware is appropriately positioned and of the appropriate lengths.  Wounds were irrigated copiously.  The medial joint capsule was repaired with imbricating sutures of 2-0 Vicryl.  Subcutaneous tissues were approximated with 3-0 Monocryl.  The skin incisions were closed with 3-0 nylon.  Sterile dressings were applied followed by a bunion wrap and a short leg splint.  The tourniquet was released after application of the dressings.  The patient was awakened from anesthesia and transported to the recovery room in stable condition.   FOLLOW UP PLAN: Nonweightbearing on the left lower extremity.  Follow-up in 2 weeks for suture removal and conversion to a short leg cast with a toe spacer.   RADIOGRAPHS: AP, lateral and oblique radiographs of the left foot are obtained intraoperatively.  These show interval correction of the bunion and second hammertoe deformities.  Hardware is appropriately positioned and of the appropriate lengths.    Mechele Claude PA-C was present and scrubbed for the duration of the operative case. His assistance was essential in positioning the patient, prepping and draping, gaining and maintaining exposure, performing the operation, closing and dressing the wounds and applying the splint.

## 2018-06-27 NOTE — Discharge Instructions (Addendum)
Wylene Simmer, MD [facility]  Please read the following information regarding your care after surgery.  Medications  You only need a prescription for the narcotic pain medicine (ex. oxycodone, Percocet, Norco).  All of the other medicines listed below are available over the counter. X Ibuprofen that you have at home as prescribed three times a day for the first 3 days after surgery. X acetominophen (Tylenol) 650 mg every 4-6 hours as you need for minor to moderate pain X oxycodone as prescribed for severe pain  Narcotic pain medicine (ex. oxycodone, Percocet, Vicodin) will cause constipation.  To prevent this problem, take the following medicines while you are taking any pain medicine. X docusate sodium (Colace) 100 mg twice a day X senna (Senokot) 2 tablets twice a day  X To help prevent blood clots, take a baby aspirin (81 mg) twice a day after surgery.  You should also get up every hour while you are awake to move around.    Weight Bearing X Do not bear any weight on the operated leg or foot.  Cast / Splint / Dressing X Keep your splint, cast or dressing clean and dry.  Dont put anything (coat hanger, pencil, etc) down inside of it.  If it gets damp, use a hair dryer on the cool setting to dry it.  If it gets soaked, call the office to schedule an appointment for a cast change.   After your dressing, cast or splint is removed; you may shower, but do not soak or scrub the wound.  Allow the water to run over it, and then gently pat it dry.  Swelling It is normal for you to have swelling where you had surgery.  To reduce swelling and pain, keep your toes above your nose for at least 3 days after surgery.  It may be necessary to keep your foot or leg elevated for several weeks.  If it hurts, it should be elevated.  Follow Up Call my office at [phone] when you are discharged from the hospital or surgery center to schedule an appointment to be seen two weeks after  surgery.  Call my office at [phone] if you develop a fever >101.5 F, nausea, vomiting, bleeding from the surgical site or severe pain.       Post Anesthesia Home Care Instructions  Activity: Get plenty of rest for the remainder of the day. A responsible individual must stay with you for 24 hours following the procedure.  For the next 24 hours, DO NOT: -Drive a car -Paediatric nurse -Drink alcoholic beverages -Take any medication unless instructed by your physician -Make any legal decisions or sign important papers.  Meals: Start with liquid foods such as gelatin or soup. Progress to regular foods as tolerated. Avoid greasy, spicy, heavy foods. If nausea and/or vomiting occur, drink only clear liquids until the nausea and/or vomiting subsides. Call your physician if vomiting continues.  Special Instructions/Symptoms: Your throat may feel dry or sore from the anesthesia or the breathing tube placed in your throat during surgery. If this causes discomfort, gargle with warm salt water. The discomfort should disappear within 24 hours.  If you had a scopolamine patch placed behind your ear for the management of post- operative nausea and/or vomiting:  1. The medication in the patch is effective for 72 hours, after which it should be removed.  Wrap patch in a tissue and discard in the trash. Wash hands thoroughly with soap and water. 2. You may remove the patch earlier  than 72 hours if you experience unpleasant side effects which may include dry mouth, dizziness or visual disturbances. 3. Avoid touching the patch. Wash your hands with soap and water after contact with the patch.     Regional Anesthesia Blocks  1. Numbness or the inability to move the "blocked" extremity may last from 3-48 hours after placement. The length of time depends on the medication injected and your individual response to the medication. If the numbness is not going away after 48 hours, call your  surgeon.  2. The extremity that is blocked will need to be protected until the numbness is gone and the  Strength has returned. Because you cannot feel it, you will need to take extra care to avoid injury. Because it may be weak, you may have difficulty moving it or using it. You may not know what position it is in without looking at it while the block is in effect.  3. For blocks in the legs and feet, returning to weight bearing and walking needs to be done carefully. You will need to wait until the numbness is entirely gone and the strength has returned. You should be able to move your leg and foot normally before you try and bear weight or walk. You will need someone to be with you when you first try to ensure you do not fall and possibly risk injury.  4. Bruising and tenderness at the needle site are common side effects and will resolve in a few days.  5. Persistent numbness or new problems with movement should be communicated to the surgeon or the [facility] [phone] [place] [phone]).

## 2018-06-27 NOTE — Anesthesia Preprocedure Evaluation (Addendum)
Anesthesia Evaluation  Patient identified by MRN, date of birth, ID band Patient awake    Reviewed: Allergy & Precautions, NPO status , Patient's Chart, lab work & pertinent test results  History of Anesthesia Complications (+) PROLONGED EMERGENCE and history of anesthetic complications  Airway Mallampati: II  TM Distance: >3 FB Neck ROM: Full    Dental no notable dental hx. (+) Teeth Intact, Dental Advisory Given   Pulmonary neg pulmonary ROS,    Pulmonary exam normal breath sounds clear to auscultation       Cardiovascular negative cardio ROS Normal cardiovascular exam Rhythm:Regular Rate:Normal     Neuro/Psych  Headaches, negative psych ROS   GI/Hepatic negative GI ROS, Neg liver ROS,   Endo/Other  negative endocrine ROS  Renal/GU negative Renal ROS  negative genitourinary   Musculoskeletal negative musculoskeletal ROS (+)   Abdominal   Peds  Hematology negative hematology ROS (+)   Anesthesia Other Findings Left bunion  Reproductive/Obstetrics negative OB ROS                            Anesthesia Physical Anesthesia Plan  ASA: II  Anesthesia Plan: General and Regional   Post-op Pain Management:  Regional for Post-op pain   Induction: Intravenous  PONV Risk Score and Plan: 3 and Dexamethasone, Treatment may vary due to age or medical condition and Ondansetron  Airway Management Planned: LMA  Additional Equipment:   Intra-op Plan:   Post-operative Plan: Extubation in OR  Informed Consent: I have reviewed the patients History and Physical, chart, labs and discussed the procedure including the risks, benefits and alternatives for the proposed anesthesia with the patient or authorized representative who has indicated his/her understanding and acceptance.   Dental advisory given  Plan Discussed with: CRNA  Anesthesia Plan Comments:         Anesthesia Quick  Evaluation

## 2018-06-27 NOTE — Anesthesia Procedure Notes (Signed)
Anesthesia Regional Block: Popliteal block   Pre-Anesthetic Checklist: ,, timeout performed, Correct Patient, Correct Site, Correct Laterality, Correct Procedure, Correct Position, site marked, Risks and benefits discussed,  Surgical consent,  Pre-op evaluation,  At surgeon's request and post-op pain management  Laterality: Left  Prep: Maximum Sterile Barrier Precautions used, chloraprep       Needles:  Injection technique: Single-shot  Needle Type: Echogenic Stimulator Needle     Needle Length: 9cm  Needle Gauge: 22     Additional Needles:   Procedures:,,,, ultrasound used (permanent image in chart),,,,  Narrative:  Start time: 06/27/2018 9:56 AM End time: 06/27/2018 10:06 AM Injection made incrementally with aspirations every 5 mL.  Performed by: Personally  Anesthesiologist: Freddrick March, MD  Additional Notes: Monitors applied. No increased pain on injection. No increased resistance to injection. Injection made in 5cc increments. Good needle visualization. Patient tolerated procedure well.

## 2018-06-28 ENCOUNTER — Encounter (HOSPITAL_BASED_OUTPATIENT_CLINIC_OR_DEPARTMENT_OTHER): Payer: Self-pay | Admitting: Orthopedic Surgery

## 2018-07-12 DIAGNOSIS — M21612 Bunion of left foot: Secondary | ICD-10-CM | POA: Diagnosis not present

## 2018-07-12 DIAGNOSIS — Z5189 Encounter for other specified aftercare: Secondary | ICD-10-CM | POA: Diagnosis not present

## 2018-08-09 DIAGNOSIS — M79672 Pain in left foot: Secondary | ICD-10-CM | POA: Diagnosis not present

## 2018-08-09 DIAGNOSIS — Z5189 Encounter for other specified aftercare: Secondary | ICD-10-CM | POA: Diagnosis not present

## 2018-08-09 DIAGNOSIS — M21612 Bunion of left foot: Secondary | ICD-10-CM | POA: Diagnosis not present

## 2018-09-02 DIAGNOSIS — R69 Illness, unspecified: Secondary | ICD-10-CM | POA: Diagnosis not present

## 2018-09-09 DIAGNOSIS — Z5189 Encounter for other specified aftercare: Secondary | ICD-10-CM | POA: Diagnosis not present

## 2018-09-09 DIAGNOSIS — M79672 Pain in left foot: Secondary | ICD-10-CM | POA: Diagnosis not present

## 2018-09-09 DIAGNOSIS — M21612 Bunion of left foot: Secondary | ICD-10-CM | POA: Diagnosis not present

## 2018-10-09 DIAGNOSIS — M21612 Bunion of left foot: Secondary | ICD-10-CM | POA: Diagnosis not present

## 2018-10-09 DIAGNOSIS — M79672 Pain in left foot: Secondary | ICD-10-CM | POA: Diagnosis not present

## 2018-10-09 DIAGNOSIS — Z09 Encounter for follow-up examination after completed treatment for conditions other than malignant neoplasm: Secondary | ICD-10-CM | POA: Diagnosis not present

## 2019-01-10 DIAGNOSIS — D2272 Melanocytic nevi of left lower limb, including hip: Secondary | ICD-10-CM | POA: Diagnosis not present

## 2019-01-10 DIAGNOSIS — D224 Melanocytic nevi of scalp and neck: Secondary | ICD-10-CM | POA: Diagnosis not present

## 2019-01-10 DIAGNOSIS — D225 Melanocytic nevi of trunk: Secondary | ICD-10-CM | POA: Diagnosis not present

## 2019-01-10 DIAGNOSIS — D229 Melanocytic nevi, unspecified: Secondary | ICD-10-CM | POA: Diagnosis not present

## 2019-01-10 DIAGNOSIS — Z808 Family history of malignant neoplasm of other organs or systems: Secondary | ICD-10-CM | POA: Diagnosis not present

## 2019-01-10 DIAGNOSIS — L570 Actinic keratosis: Secondary | ICD-10-CM | POA: Diagnosis not present

## 2019-01-10 DIAGNOSIS — D2230 Melanocytic nevi of unspecified part of face: Secondary | ICD-10-CM | POA: Diagnosis not present

## 2019-01-10 DIAGNOSIS — Z86018 Personal history of other benign neoplasm: Secondary | ICD-10-CM | POA: Diagnosis not present

## 2019-03-10 DIAGNOSIS — R69 Illness, unspecified: Secondary | ICD-10-CM | POA: Diagnosis not present

## 2019-03-22 DIAGNOSIS — Z23 Encounter for immunization: Secondary | ICD-10-CM | POA: Diagnosis not present

## 2019-04-25 ENCOUNTER — Other Ambulatory Visit: Payer: Self-pay | Admitting: Internal Medicine

## 2019-04-25 DIAGNOSIS — Z1231 Encounter for screening mammogram for malignant neoplasm of breast: Secondary | ICD-10-CM

## 2019-05-20 DIAGNOSIS — H353131 Nonexudative age-related macular degeneration, bilateral, early dry stage: Secondary | ICD-10-CM | POA: Diagnosis not present

## 2019-06-17 DIAGNOSIS — H25043 Posterior subcapsular polar age-related cataract, bilateral: Secondary | ICD-10-CM | POA: Diagnosis not present

## 2019-06-17 DIAGNOSIS — H353131 Nonexudative age-related macular degeneration, bilateral, early dry stage: Secondary | ICD-10-CM | POA: Diagnosis not present

## 2019-06-17 DIAGNOSIS — H18413 Arcus senilis, bilateral: Secondary | ICD-10-CM | POA: Diagnosis not present

## 2019-06-17 DIAGNOSIS — H2512 Age-related nuclear cataract, left eye: Secondary | ICD-10-CM | POA: Diagnosis not present

## 2019-06-17 DIAGNOSIS — H2513 Age-related nuclear cataract, bilateral: Secondary | ICD-10-CM | POA: Diagnosis not present

## 2019-06-27 ENCOUNTER — Ambulatory Visit: Payer: Medicare HMO

## 2019-06-30 DIAGNOSIS — M859 Disorder of bone density and structure, unspecified: Secondary | ICD-10-CM | POA: Diagnosis not present

## 2019-06-30 DIAGNOSIS — Z0000 Encounter for general adult medical examination without abnormal findings: Secondary | ICD-10-CM | POA: Diagnosis not present

## 2019-07-04 ENCOUNTER — Ambulatory Visit
Admission: RE | Admit: 2019-07-04 | Discharge: 2019-07-04 | Disposition: A | Discharge status: Home / Self Care | Payer: Medicare HMO | Source: Ambulatory Visit | Attending: Internal Medicine | Admitting: Internal Medicine

## 2019-07-04 ENCOUNTER — Other Ambulatory Visit: Payer: Self-pay

## 2019-07-04 DIAGNOSIS — D126 Benign neoplasm of colon, unspecified: Secondary | ICD-10-CM | POA: Diagnosis not present

## 2019-07-04 DIAGNOSIS — M8580 Other specified disorders of bone density and structure, unspecified site: Secondary | ICD-10-CM | POA: Diagnosis not present

## 2019-07-04 DIAGNOSIS — G4700 Insomnia, unspecified: Secondary | ICD-10-CM | POA: Diagnosis not present

## 2019-07-04 DIAGNOSIS — R82998 Other abnormal findings in urine: Secondary | ICD-10-CM | POA: Diagnosis not present

## 2019-07-04 DIAGNOSIS — R69 Illness, unspecified: Secondary | ICD-10-CM | POA: Diagnosis not present

## 2019-07-04 DIAGNOSIS — Z1331 Encounter for screening for depression: Secondary | ICD-10-CM | POA: Diagnosis not present

## 2019-07-04 DIAGNOSIS — Z1231 Encounter for screening mammogram for malignant neoplasm of breast: Secondary | ICD-10-CM

## 2019-07-04 DIAGNOSIS — E559 Vitamin D deficiency, unspecified: Secondary | ICD-10-CM | POA: Diagnosis not present

## 2019-07-04 DIAGNOSIS — G43909 Migraine, unspecified, not intractable, without status migrainosus: Secondary | ICD-10-CM | POA: Diagnosis not present

## 2019-07-04 DIAGNOSIS — Z801 Family history of malignant neoplasm of trachea, bronchus and lung: Secondary | ICD-10-CM | POA: Diagnosis not present

## 2019-07-04 DIAGNOSIS — M545 Low back pain: Secondary | ICD-10-CM | POA: Diagnosis not present

## 2019-07-04 DIAGNOSIS — Z0000 Encounter for general adult medical examination without abnormal findings: Secondary | ICD-10-CM | POA: Diagnosis not present

## 2019-07-15 DIAGNOSIS — M4726 Other spondylosis with radiculopathy, lumbar region: Secondary | ICD-10-CM | POA: Diagnosis not present

## 2019-07-15 DIAGNOSIS — M545 Low back pain: Secondary | ICD-10-CM | POA: Diagnosis not present

## 2019-07-17 DIAGNOSIS — Z1212 Encounter for screening for malignant neoplasm of rectum: Secondary | ICD-10-CM | POA: Diagnosis not present

## 2019-07-18 DIAGNOSIS — H25041 Posterior subcapsular polar age-related cataract, right eye: Secondary | ICD-10-CM | POA: Diagnosis not present

## 2019-07-18 DIAGNOSIS — H2511 Age-related nuclear cataract, right eye: Secondary | ICD-10-CM | POA: Diagnosis not present

## 2019-07-18 DIAGNOSIS — H25011 Cortical age-related cataract, right eye: Secondary | ICD-10-CM | POA: Diagnosis not present

## 2019-07-18 DIAGNOSIS — H2512 Age-related nuclear cataract, left eye: Secondary | ICD-10-CM | POA: Diagnosis not present

## 2019-07-21 ENCOUNTER — Other Ambulatory Visit: Payer: Self-pay | Admitting: Orthopaedic Surgery

## 2019-07-21 DIAGNOSIS — M4726 Other spondylosis with radiculopathy, lumbar region: Secondary | ICD-10-CM

## 2019-08-06 ENCOUNTER — Other Ambulatory Visit: Payer: Self-pay

## 2019-08-06 ENCOUNTER — Ambulatory Visit
Admission: RE | Admit: 2019-08-06 | Discharge: 2019-08-06 | Disposition: A | Discharge status: Home / Self Care | Payer: Medicare HMO | Source: Ambulatory Visit | Attending: Orthopaedic Surgery | Admitting: Orthopaedic Surgery

## 2019-08-06 DIAGNOSIS — M4726 Other spondylosis with radiculopathy, lumbar region: Secondary | ICD-10-CM

## 2019-08-06 DIAGNOSIS — M48061 Spinal stenosis, lumbar region without neurogenic claudication: Secondary | ICD-10-CM | POA: Diagnosis not present

## 2019-08-08 DIAGNOSIS — H2511 Age-related nuclear cataract, right eye: Secondary | ICD-10-CM | POA: Diagnosis not present

## 2019-08-13 DIAGNOSIS — M4726 Other spondylosis with radiculopathy, lumbar region: Secondary | ICD-10-CM | POA: Diagnosis not present

## 2019-08-13 DIAGNOSIS — M48061 Spinal stenosis, lumbar region without neurogenic claudication: Secondary | ICD-10-CM | POA: Diagnosis not present

## 2019-08-13 DIAGNOSIS — M5416 Radiculopathy, lumbar region: Secondary | ICD-10-CM | POA: Diagnosis not present

## 2019-08-19 DIAGNOSIS — M5416 Radiculopathy, lumbar region: Secondary | ICD-10-CM | POA: Diagnosis not present

## 2019-09-03 DIAGNOSIS — M4726 Other spondylosis with radiculopathy, lumbar region: Secondary | ICD-10-CM | POA: Diagnosis not present

## 2019-09-03 DIAGNOSIS — M5416 Radiculopathy, lumbar region: Secondary | ICD-10-CM | POA: Diagnosis not present

## 2019-09-03 DIAGNOSIS — Z6820 Body mass index (BMI) 20.0-20.9, adult: Secondary | ICD-10-CM | POA: Diagnosis not present

## 2019-09-03 DIAGNOSIS — M48061 Spinal stenosis, lumbar region without neurogenic claudication: Secondary | ICD-10-CM | POA: Diagnosis not present

## 2019-09-10 DIAGNOSIS — M5416 Radiculopathy, lumbar region: Secondary | ICD-10-CM | POA: Diagnosis not present

## 2019-10-01 DIAGNOSIS — M5416 Radiculopathy, lumbar region: Secondary | ICD-10-CM | POA: Diagnosis not present

## 2019-10-01 DIAGNOSIS — M48061 Spinal stenosis, lumbar region without neurogenic claudication: Secondary | ICD-10-CM | POA: Diagnosis not present

## 2019-10-01 DIAGNOSIS — M4726 Other spondylosis with radiculopathy, lumbar region: Secondary | ICD-10-CM | POA: Diagnosis not present

## 2019-10-01 DIAGNOSIS — Z6820 Body mass index (BMI) 20.0-20.9, adult: Secondary | ICD-10-CM | POA: Diagnosis not present

## 2019-10-08 DIAGNOSIS — M48061 Spinal stenosis, lumbar region without neurogenic claudication: Secondary | ICD-10-CM | POA: Diagnosis not present

## 2019-10-08 DIAGNOSIS — M4726 Other spondylosis with radiculopathy, lumbar region: Secondary | ICD-10-CM | POA: Diagnosis not present

## 2019-10-08 DIAGNOSIS — M5416 Radiculopathy, lumbar region: Secondary | ICD-10-CM | POA: Diagnosis not present

## 2019-10-08 DIAGNOSIS — M48062 Spinal stenosis, lumbar region with neurogenic claudication: Secondary | ICD-10-CM | POA: Diagnosis not present

## 2019-11-19 DIAGNOSIS — R69 Illness, unspecified: Secondary | ICD-10-CM | POA: Diagnosis not present

## 2019-11-21 DIAGNOSIS — M48061 Spinal stenosis, lumbar region without neurogenic claudication: Secondary | ICD-10-CM | POA: Diagnosis not present

## 2019-11-21 DIAGNOSIS — M5416 Radiculopathy, lumbar region: Secondary | ICD-10-CM | POA: Diagnosis not present

## 2019-11-21 DIAGNOSIS — Z4689 Encounter for fitting and adjustment of other specified devices: Secondary | ICD-10-CM | POA: Diagnosis not present

## 2019-11-21 DIAGNOSIS — M48062 Spinal stenosis, lumbar region with neurogenic claudication: Secondary | ICD-10-CM | POA: Diagnosis not present

## 2019-11-21 DIAGNOSIS — I10 Essential (primary) hypertension: Secondary | ICD-10-CM | POA: Diagnosis not present

## 2019-11-21 DIAGNOSIS — M5106 Intervertebral disc disorders with myelopathy, lumbar region: Secondary | ICD-10-CM | POA: Diagnosis not present

## 2019-11-21 DIAGNOSIS — M4726 Other spondylosis with radiculopathy, lumbar region: Secondary | ICD-10-CM | POA: Diagnosis not present

## 2019-11-25 DIAGNOSIS — Z01812 Encounter for preprocedural laboratory examination: Secondary | ICD-10-CM | POA: Diagnosis not present

## 2019-11-25 DIAGNOSIS — I4510 Unspecified right bundle-branch block: Secondary | ICD-10-CM | POA: Diagnosis not present

## 2019-11-25 DIAGNOSIS — M48061 Spinal stenosis, lumbar region without neurogenic claudication: Secondary | ICD-10-CM | POA: Diagnosis not present

## 2019-11-25 DIAGNOSIS — Z01810 Encounter for preprocedural cardiovascular examination: Secondary | ICD-10-CM | POA: Diagnosis not present

## 2019-11-25 DIAGNOSIS — Z20822 Contact with and (suspected) exposure to covid-19: Secondary | ICD-10-CM | POA: Diagnosis not present

## 2019-12-02 DIAGNOSIS — M5106 Intervertebral disc disorders with myelopathy, lumbar region: Secondary | ICD-10-CM | POA: Diagnosis not present

## 2019-12-02 DIAGNOSIS — Z7409 Other reduced mobility: Secondary | ICD-10-CM | POA: Diagnosis not present

## 2019-12-02 DIAGNOSIS — M48062 Spinal stenosis, lumbar region with neurogenic claudication: Secondary | ICD-10-CM | POA: Diagnosis not present

## 2019-12-02 DIAGNOSIS — M5116 Intervertebral disc disorders with radiculopathy, lumbar region: Secondary | ICD-10-CM | POA: Diagnosis not present

## 2019-12-02 DIAGNOSIS — M5126 Other intervertebral disc displacement, lumbar region: Secondary | ICD-10-CM | POA: Diagnosis not present

## 2019-12-02 DIAGNOSIS — Z981 Arthrodesis status: Secondary | ICD-10-CM | POA: Diagnosis not present

## 2019-12-02 DIAGNOSIS — M4726 Other spondylosis with radiculopathy, lumbar region: Secondary | ICD-10-CM | POA: Diagnosis not present

## 2019-12-03 DIAGNOSIS — M4726 Other spondylosis with radiculopathy, lumbar region: Secondary | ICD-10-CM | POA: Diagnosis not present

## 2020-01-01 DIAGNOSIS — M545 Low back pain: Secondary | ICD-10-CM | POA: Diagnosis not present

## 2020-01-16 DIAGNOSIS — D229 Melanocytic nevi, unspecified: Secondary | ICD-10-CM | POA: Diagnosis not present

## 2020-01-16 DIAGNOSIS — D225 Melanocytic nevi of trunk: Secondary | ICD-10-CM | POA: Diagnosis not present

## 2020-01-16 DIAGNOSIS — L814 Other melanin hyperpigmentation: Secondary | ICD-10-CM | POA: Diagnosis not present

## 2020-01-16 DIAGNOSIS — L821 Other seborrheic keratosis: Secondary | ICD-10-CM | POA: Diagnosis not present

## 2020-01-16 DIAGNOSIS — D224 Melanocytic nevi of scalp and neck: Secondary | ICD-10-CM | POA: Diagnosis not present

## 2020-01-16 DIAGNOSIS — Z808 Family history of malignant neoplasm of other organs or systems: Secondary | ICD-10-CM | POA: Diagnosis not present

## 2020-01-16 DIAGNOSIS — D485 Neoplasm of uncertain behavior of skin: Secondary | ICD-10-CM | POA: Diagnosis not present

## 2020-01-16 DIAGNOSIS — D2272 Melanocytic nevi of left lower limb, including hip: Secondary | ICD-10-CM | POA: Diagnosis not present

## 2020-01-16 DIAGNOSIS — D2230 Melanocytic nevi of unspecified part of face: Secondary | ICD-10-CM | POA: Diagnosis not present

## 2020-01-16 DIAGNOSIS — Z86018 Personal history of other benign neoplasm: Secondary | ICD-10-CM | POA: Diagnosis not present

## 2020-01-16 DIAGNOSIS — L570 Actinic keratosis: Secondary | ICD-10-CM | POA: Diagnosis not present

## 2020-04-21 DIAGNOSIS — L570 Actinic keratosis: Secondary | ICD-10-CM | POA: Diagnosis not present

## 2020-04-21 DIAGNOSIS — D692 Other nonthrombocytopenic purpura: Secondary | ICD-10-CM | POA: Diagnosis not present

## 2020-04-21 DIAGNOSIS — M48062 Spinal stenosis, lumbar region with neurogenic claudication: Secondary | ICD-10-CM | POA: Diagnosis not present

## 2020-06-07 ENCOUNTER — Other Ambulatory Visit: Payer: Self-pay | Admitting: Internal Medicine

## 2020-06-07 DIAGNOSIS — Z1231 Encounter for screening mammogram for malignant neoplasm of breast: Secondary | ICD-10-CM

## 2020-07-22 ENCOUNTER — Other Ambulatory Visit: Payer: Self-pay

## 2020-07-22 ENCOUNTER — Other Ambulatory Visit: Payer: Self-pay | Admitting: Internal Medicine

## 2020-07-22 ENCOUNTER — Ambulatory Visit
Admission: RE | Admit: 2020-07-22 | Discharge: 2020-07-22 | Disposition: A | Discharge status: Home / Self Care | Payer: Medicare HMO | Source: Ambulatory Visit | Attending: Internal Medicine | Admitting: Internal Medicine

## 2020-07-22 DIAGNOSIS — Z1231 Encounter for screening mammogram for malignant neoplasm of breast: Secondary | ICD-10-CM | POA: Diagnosis not present

## 2020-08-02 DIAGNOSIS — H5202 Hypermetropia, left eye: Secondary | ICD-10-CM | POA: Diagnosis not present

## 2020-08-02 DIAGNOSIS — H40013 Open angle with borderline findings, low risk, bilateral: Secondary | ICD-10-CM | POA: Diagnosis not present

## 2020-08-02 DIAGNOSIS — Z961 Presence of intraocular lens: Secondary | ICD-10-CM | POA: Diagnosis not present

## 2020-08-02 DIAGNOSIS — H16223 Keratoconjunctivitis sicca, not specified as Sjogren's, bilateral: Secondary | ICD-10-CM | POA: Diagnosis not present

## 2020-08-02 DIAGNOSIS — H43813 Vitreous degeneration, bilateral: Secondary | ICD-10-CM | POA: Diagnosis not present

## 2020-08-02 DIAGNOSIS — H52222 Regular astigmatism, left eye: Secondary | ICD-10-CM | POA: Diagnosis not present

## 2020-08-02 DIAGNOSIS — H353132 Nonexudative age-related macular degeneration, bilateral, intermediate dry stage: Secondary | ICD-10-CM | POA: Diagnosis not present

## 2020-08-02 DIAGNOSIS — H5211 Myopia, right eye: Secondary | ICD-10-CM | POA: Diagnosis not present

## 2020-08-03 DIAGNOSIS — I10 Essential (primary) hypertension: Secondary | ICD-10-CM | POA: Diagnosis not present

## 2020-08-03 DIAGNOSIS — G8929 Other chronic pain: Secondary | ICD-10-CM | POA: Diagnosis not present

## 2020-08-03 DIAGNOSIS — Z1331 Encounter for screening for depression: Secondary | ICD-10-CM | POA: Diagnosis not present

## 2020-08-03 DIAGNOSIS — M545 Low back pain, unspecified: Secondary | ICD-10-CM | POA: Diagnosis not present

## 2020-08-03 DIAGNOSIS — H6120 Impacted cerumen, unspecified ear: Secondary | ICD-10-CM | POA: Diagnosis not present

## 2020-08-03 DIAGNOSIS — E559 Vitamin D deficiency, unspecified: Secondary | ICD-10-CM | POA: Diagnosis not present

## 2020-08-03 DIAGNOSIS — Z9181 History of falling: Secondary | ICD-10-CM | POA: Diagnosis not present

## 2020-08-03 DIAGNOSIS — Z139 Encounter for screening, unspecified: Secondary | ICD-10-CM | POA: Diagnosis not present

## 2020-08-03 DIAGNOSIS — J309 Allergic rhinitis, unspecified: Secondary | ICD-10-CM | POA: Diagnosis not present

## 2020-08-03 DIAGNOSIS — E785 Hyperlipidemia, unspecified: Secondary | ICD-10-CM | POA: Diagnosis not present

## 2020-08-03 DIAGNOSIS — Z0000 Encounter for general adult medical examination without abnormal findings: Secondary | ICD-10-CM | POA: Diagnosis not present

## 2020-08-03 DIAGNOSIS — Z79899 Other long term (current) drug therapy: Secondary | ICD-10-CM | POA: Diagnosis not present

## 2020-08-17 DIAGNOSIS — Z0100 Encounter for examination of eyes and vision without abnormal findings: Secondary | ICD-10-CM | POA: Diagnosis not present

## 2020-08-31 DIAGNOSIS — G8929 Other chronic pain: Secondary | ICD-10-CM | POA: Diagnosis not present

## 2020-08-31 DIAGNOSIS — I10 Essential (primary) hypertension: Secondary | ICD-10-CM | POA: Diagnosis not present

## 2020-08-31 DIAGNOSIS — E2839 Other primary ovarian failure: Secondary | ICD-10-CM | POA: Diagnosis not present

## 2020-08-31 DIAGNOSIS — Z8669 Personal history of other diseases of the nervous system and sense organs: Secondary | ICD-10-CM | POA: Diagnosis not present

## 2020-08-31 DIAGNOSIS — Z6820 Body mass index (BMI) 20.0-20.9, adult: Secondary | ICD-10-CM | POA: Diagnosis not present

## 2020-08-31 DIAGNOSIS — M545 Low back pain, unspecified: Secondary | ICD-10-CM | POA: Diagnosis not present

## 2020-08-31 DIAGNOSIS — E559 Vitamin D deficiency, unspecified: Secondary | ICD-10-CM | POA: Diagnosis not present

## 2020-08-31 DIAGNOSIS — E785 Hyperlipidemia, unspecified: Secondary | ICD-10-CM | POA: Diagnosis not present

## 2020-08-31 DIAGNOSIS — J309 Allergic rhinitis, unspecified: Secondary | ICD-10-CM | POA: Diagnosis not present

## 2020-09-06 DIAGNOSIS — E2839 Other primary ovarian failure: Secondary | ICD-10-CM | POA: Diagnosis not present

## 2020-09-06 DIAGNOSIS — M8588 Other specified disorders of bone density and structure, other site: Secondary | ICD-10-CM | POA: Diagnosis not present

## 2020-09-06 DIAGNOSIS — M85851 Other specified disorders of bone density and structure, right thigh: Secondary | ICD-10-CM | POA: Diagnosis not present

## 2021-03-25 DIAGNOSIS — D225 Melanocytic nevi of trunk: Secondary | ICD-10-CM | POA: Diagnosis not present

## 2021-03-25 DIAGNOSIS — Z23 Encounter for immunization: Secondary | ICD-10-CM | POA: Diagnosis not present

## 2021-03-25 DIAGNOSIS — D2272 Melanocytic nevi of left lower limb, including hip: Secondary | ICD-10-CM | POA: Diagnosis not present

## 2021-03-25 DIAGNOSIS — L821 Other seborrheic keratosis: Secondary | ICD-10-CM | POA: Diagnosis not present

## 2021-03-25 DIAGNOSIS — D229 Melanocytic nevi, unspecified: Secondary | ICD-10-CM | POA: Diagnosis not present

## 2021-03-25 DIAGNOSIS — Z86018 Personal history of other benign neoplasm: Secondary | ICD-10-CM | POA: Diagnosis not present

## 2021-03-25 DIAGNOSIS — L578 Other skin changes due to chronic exposure to nonionizing radiation: Secondary | ICD-10-CM | POA: Diagnosis not present

## 2021-03-25 DIAGNOSIS — Z808 Family history of malignant neoplasm of other organs or systems: Secondary | ICD-10-CM | POA: Diagnosis not present

## 2021-03-25 DIAGNOSIS — D2230 Melanocytic nevi of unspecified part of face: Secondary | ICD-10-CM | POA: Diagnosis not present

## 2021-03-25 DIAGNOSIS — D224 Melanocytic nevi of scalp and neck: Secondary | ICD-10-CM | POA: Diagnosis not present

## 2021-06-21 ENCOUNTER — Other Ambulatory Visit: Payer: Self-pay | Admitting: Internal Medicine

## 2021-06-21 DIAGNOSIS — Z1231 Encounter for screening mammogram for malignant neoplasm of breast: Secondary | ICD-10-CM

## 2021-07-25 ENCOUNTER — Ambulatory Visit: Payer: Medicare HMO

## 2021-07-25 DIAGNOSIS — H43813 Vitreous degeneration, bilateral: Secondary | ICD-10-CM | POA: Diagnosis not present

## 2021-07-25 DIAGNOSIS — Z0100 Encounter for examination of eyes and vision without abnormal findings: Secondary | ICD-10-CM | POA: Diagnosis not present

## 2021-07-25 DIAGNOSIS — Z961 Presence of intraocular lens: Secondary | ICD-10-CM | POA: Diagnosis not present

## 2021-07-25 DIAGNOSIS — H40013 Open angle with borderline findings, low risk, bilateral: Secondary | ICD-10-CM | POA: Diagnosis not present

## 2021-07-25 DIAGNOSIS — H353112 Nonexudative age-related macular degeneration, right eye, intermediate dry stage: Secondary | ICD-10-CM | POA: Diagnosis not present

## 2021-07-25 DIAGNOSIS — H353132 Nonexudative age-related macular degeneration, bilateral, intermediate dry stage: Secondary | ICD-10-CM | POA: Diagnosis not present

## 2021-07-25 DIAGNOSIS — H04123 Dry eye syndrome of bilateral lacrimal glands: Secondary | ICD-10-CM | POA: Diagnosis not present

## 2021-07-25 DIAGNOSIS — H353122 Nonexudative age-related macular degeneration, left eye, intermediate dry stage: Secondary | ICD-10-CM | POA: Diagnosis not present

## 2021-07-27 ENCOUNTER — Other Ambulatory Visit: Payer: Self-pay | Admitting: Orthopaedic Surgery

## 2021-07-27 DIAGNOSIS — M4726 Other spondylosis with radiculopathy, lumbar region: Secondary | ICD-10-CM

## 2021-07-27 DIAGNOSIS — M48062 Spinal stenosis, lumbar region with neurogenic claudication: Secondary | ICD-10-CM | POA: Diagnosis not present

## 2021-07-27 DIAGNOSIS — I10 Essential (primary) hypertension: Secondary | ICD-10-CM | POA: Diagnosis not present

## 2021-07-27 DIAGNOSIS — Z6820 Body mass index (BMI) 20.0-20.9, adult: Secondary | ICD-10-CM | POA: Diagnosis not present

## 2021-08-01 ENCOUNTER — Ambulatory Visit: Payer: Medicare HMO

## 2021-08-01 ENCOUNTER — Ambulatory Visit
Admission: RE | Admit: 2021-08-01 | Discharge: 2021-08-01 | Disposition: A | Discharge status: Home / Self Care | Payer: Medicare HMO | Source: Ambulatory Visit | Attending: Internal Medicine | Admitting: Internal Medicine

## 2021-08-01 DIAGNOSIS — Z1231 Encounter for screening mammogram for malignant neoplasm of breast: Secondary | ICD-10-CM

## 2021-08-07 ENCOUNTER — Other Ambulatory Visit: Payer: Medicare HMO

## 2021-08-08 ENCOUNTER — Other Ambulatory Visit: Payer: Self-pay

## 2021-08-08 ENCOUNTER — Ambulatory Visit
Admission: RE | Admit: 2021-08-08 | Discharge: 2021-08-08 | Disposition: A | Discharge status: Home / Self Care | Payer: Medicare HMO | Source: Ambulatory Visit | Attending: Orthopaedic Surgery | Admitting: Orthopaedic Surgery

## 2021-08-08 DIAGNOSIS — M4726 Other spondylosis with radiculopathy, lumbar region: Secondary | ICD-10-CM

## 2021-08-08 DIAGNOSIS — M48061 Spinal stenosis, lumbar region without neurogenic claudication: Secondary | ICD-10-CM | POA: Diagnosis not present

## 2021-08-08 DIAGNOSIS — M545 Low back pain, unspecified: Secondary | ICD-10-CM | POA: Diagnosis not present

## 2021-08-08 DIAGNOSIS — M5416 Radiculopathy, lumbar region: Secondary | ICD-10-CM | POA: Diagnosis not present

## 2021-08-16 DIAGNOSIS — M5416 Radiculopathy, lumbar region: Secondary | ICD-10-CM | POA: Diagnosis not present

## 2021-08-16 DIAGNOSIS — H353132 Nonexudative age-related macular degeneration, bilateral, intermediate dry stage: Secondary | ICD-10-CM | POA: Diagnosis not present

## 2021-08-16 DIAGNOSIS — H43813 Vitreous degeneration, bilateral: Secondary | ICD-10-CM | POA: Diagnosis not present

## 2021-08-24 ENCOUNTER — Encounter: Payer: Self-pay | Admitting: Gastroenterology

## 2021-09-05 DIAGNOSIS — M5416 Radiculopathy, lumbar region: Secondary | ICD-10-CM | POA: Diagnosis not present

## 2021-10-03 DIAGNOSIS — Z79891 Long term (current) use of opiate analgesic: Secondary | ICD-10-CM | POA: Diagnosis not present

## 2021-12-22 IMAGING — MG MM DIGITAL SCREENING BILAT W/ TOMO AND CAD
8 series · 9 of 24 positions shown · non-contrast
Comparison: Previous exam(s).

CLINICAL DATA: Screening.

EXAM:
DIGITAL SCREENING BILATERAL MAMMOGRAM WITH TOMOSYNTHESIS AND CAD

[L MLO synth-2D]
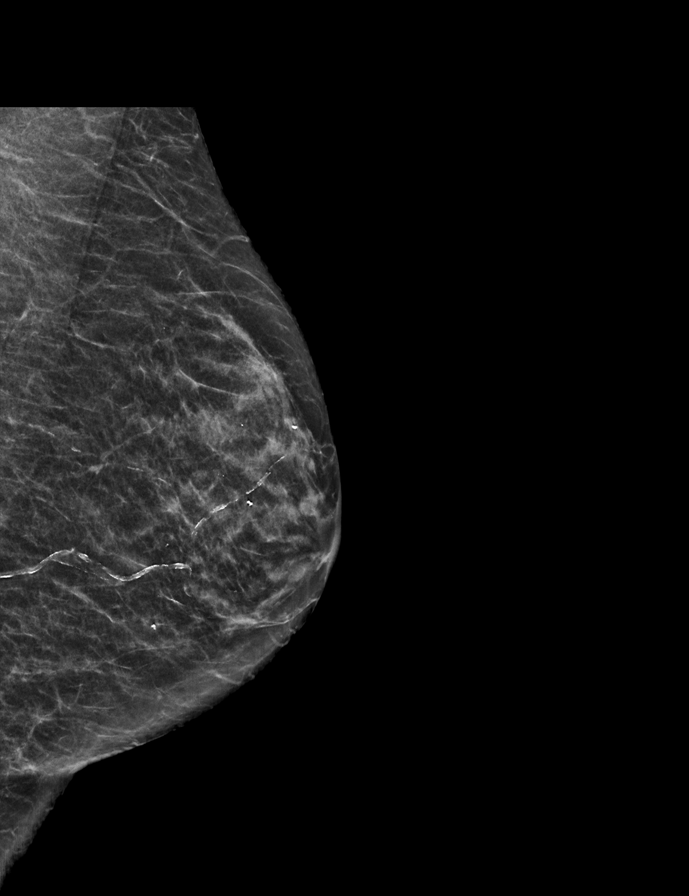

[R CC synth-2D]
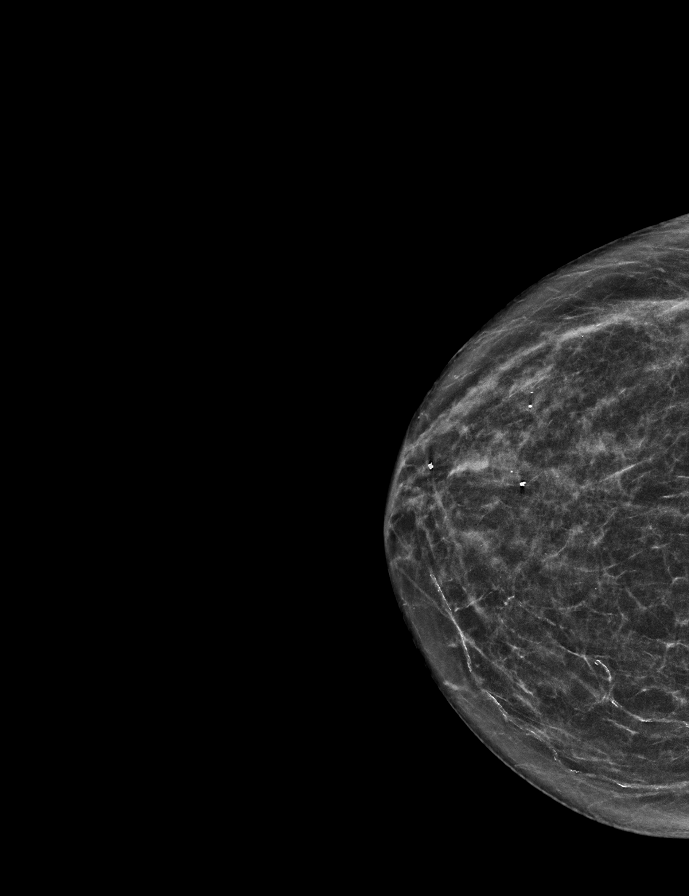

[L CC synth-2D]
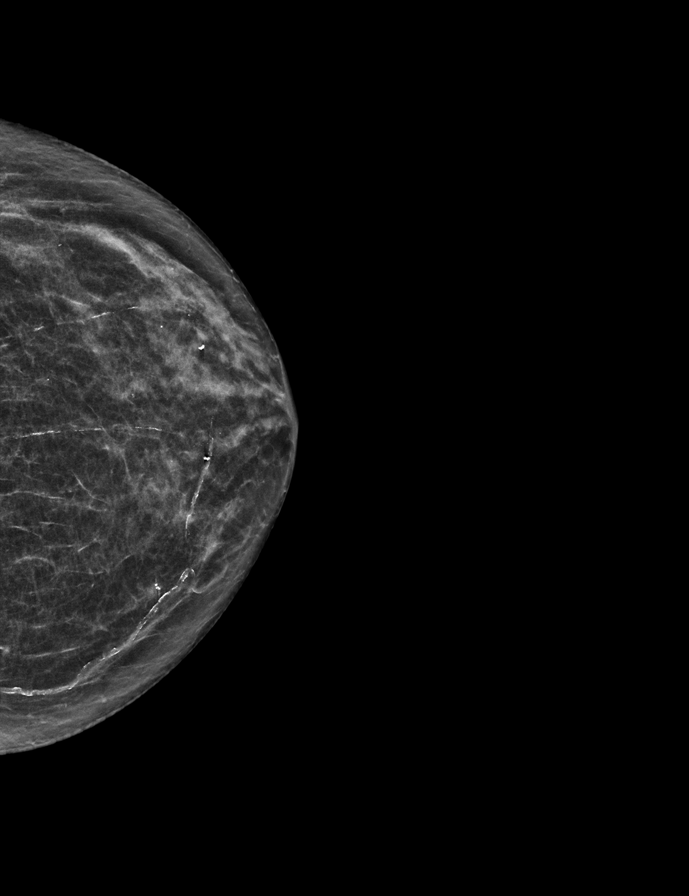

[R MLO synth-2D]
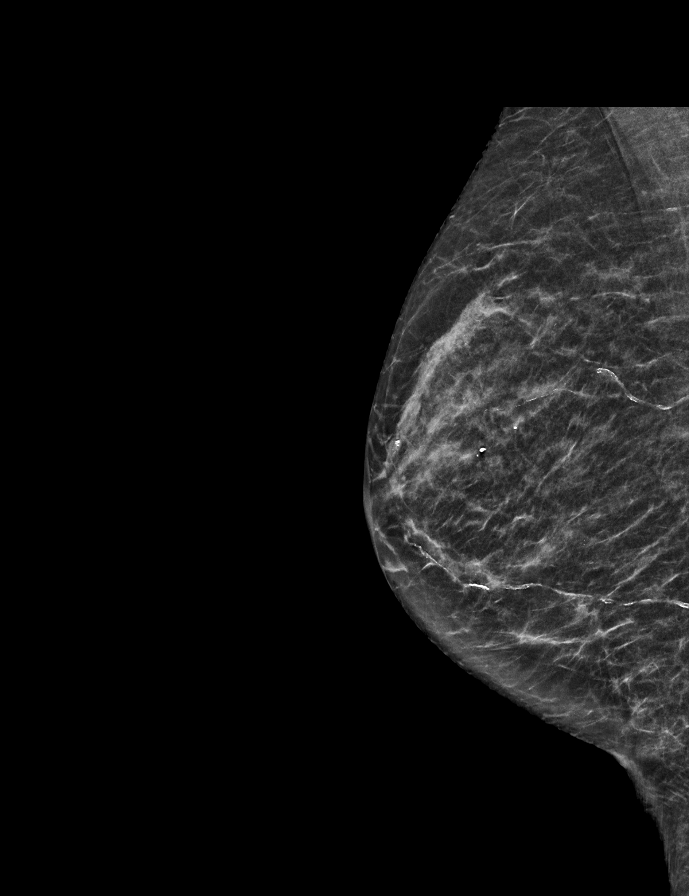

[R MLO tomo · 2 of 48 frames shown]
[frame 16/48]
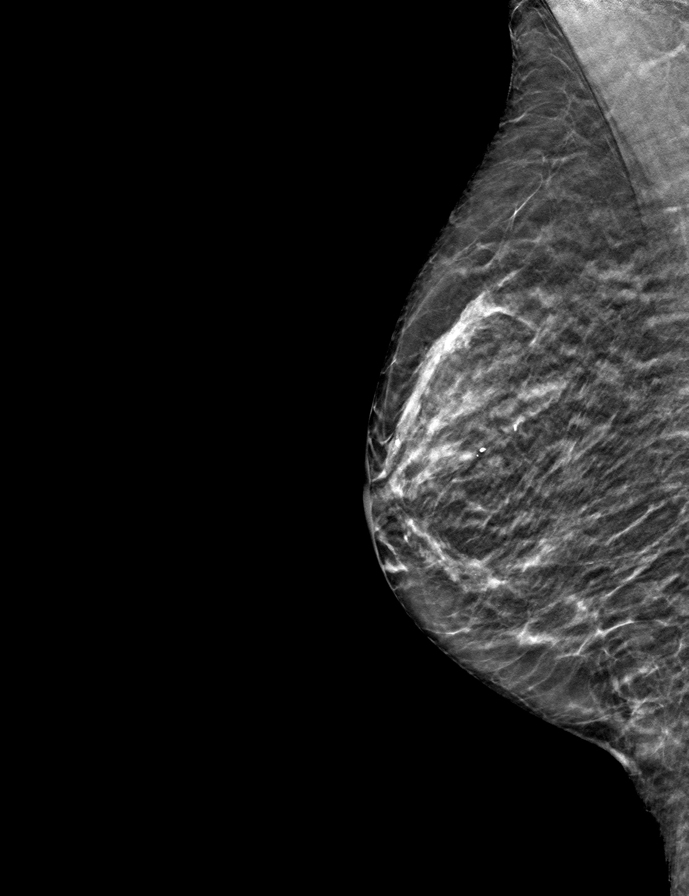
[frame 25/48]
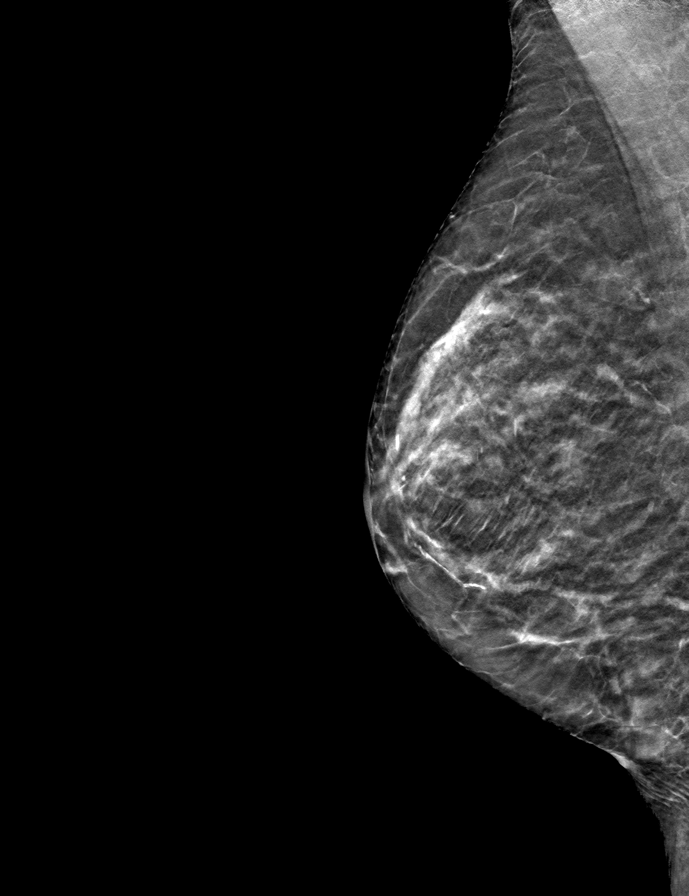

[R CC tomo · tomo slice 27/53.0]
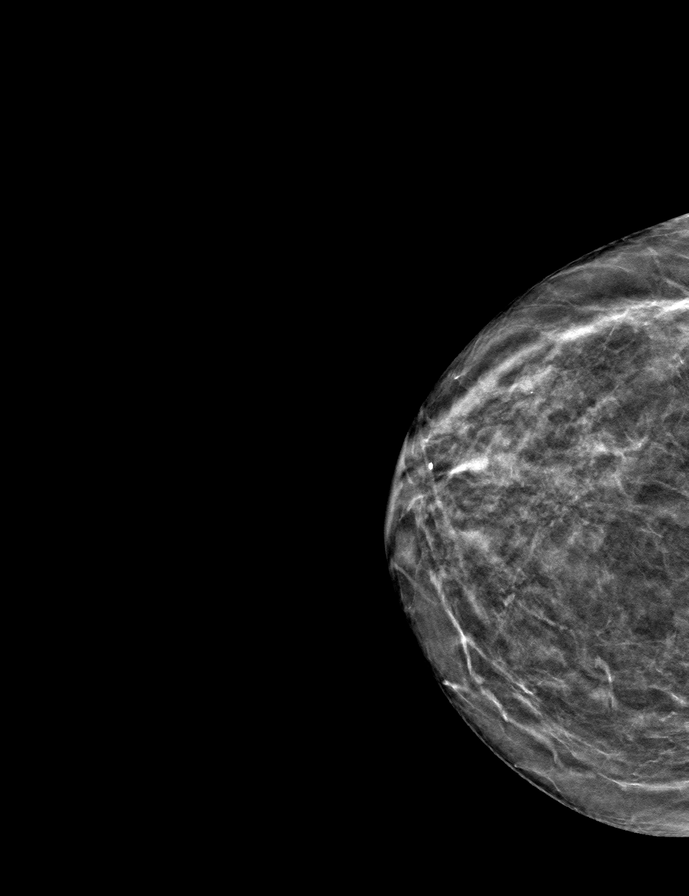

[L CC tomo · tomo slice 25/50.0]
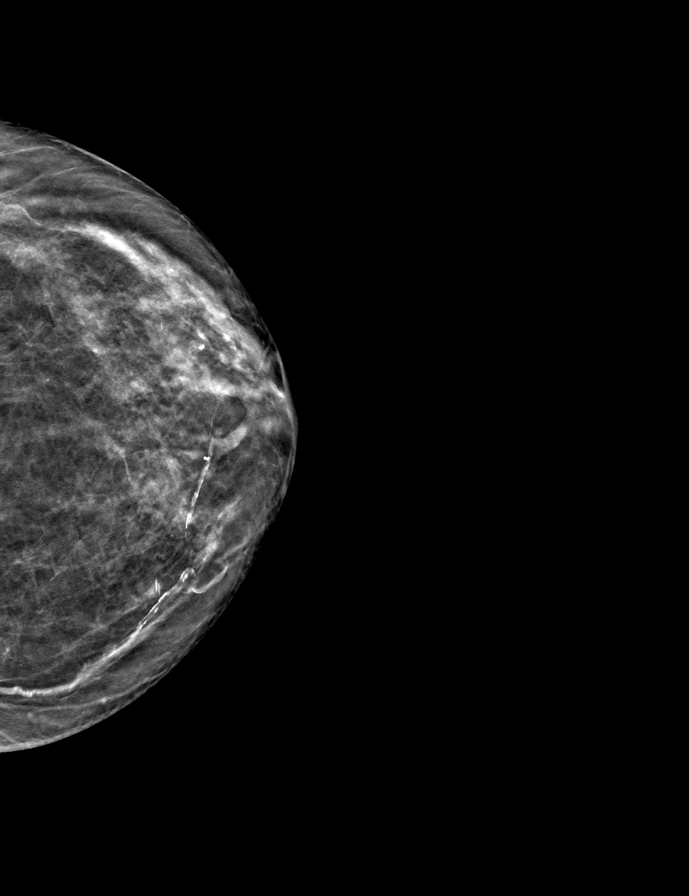

[L MLO tomo · tomo slice 24/47.0]
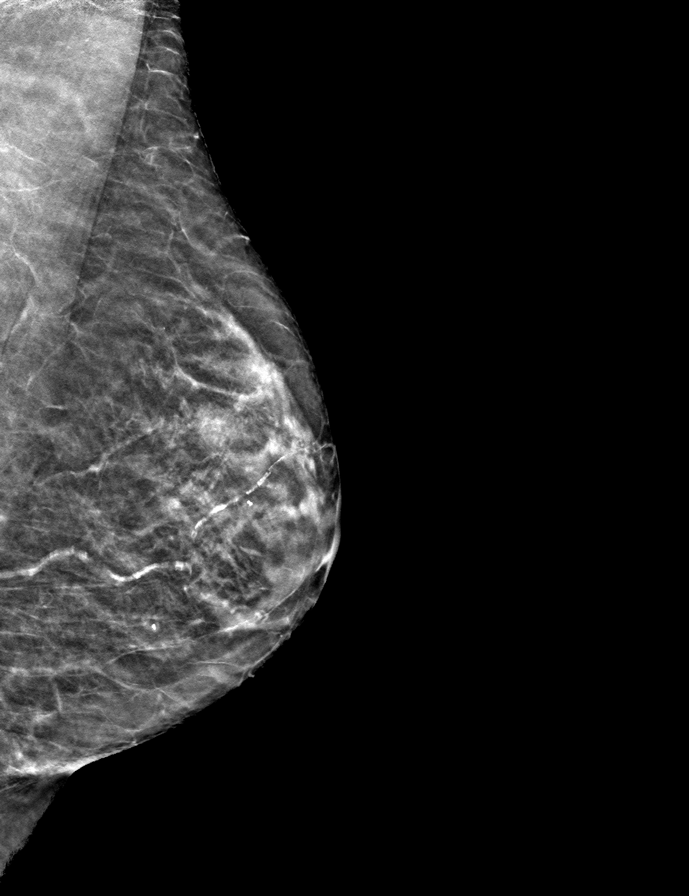

[9 of 24 positions shown; findings below may reference images not displayed]

ACR Breast Density Category b: There are scattered areas of
fibroglandular density.
FINDINGS: There are no findings suspicious for malignancy. The images were
evaluated with computer-aided detection.
IMPRESSION: No mammographic evidence of malignancy. A result letter of this
screening mammogram will be mailed directly to the patient.

RECOMMENDATION:
Screening mammogram in one year. (Code:[id number]-[id number])

BI-RADS CATEGORY  1: Negative.

## 2022-01-17 DIAGNOSIS — H26491 Other secondary cataract, right eye: Secondary | ICD-10-CM | POA: Diagnosis not present

## 2022-01-17 DIAGNOSIS — H43813 Vitreous degeneration, bilateral: Secondary | ICD-10-CM | POA: Diagnosis not present

## 2022-01-17 DIAGNOSIS — H353132 Nonexudative age-related macular degeneration, bilateral, intermediate dry stage: Secondary | ICD-10-CM | POA: Diagnosis not present

## 2022-04-04 DIAGNOSIS — D224 Melanocytic nevi of scalp and neck: Secondary | ICD-10-CM | POA: Diagnosis not present

## 2022-04-04 DIAGNOSIS — Z808 Family history of malignant neoplasm of other organs or systems: Secondary | ICD-10-CM | POA: Diagnosis not present

## 2022-04-04 DIAGNOSIS — D2230 Melanocytic nevi of unspecified part of face: Secondary | ICD-10-CM | POA: Diagnosis not present

## 2022-04-04 DIAGNOSIS — D2271 Melanocytic nevi of right lower limb, including hip: Secondary | ICD-10-CM | POA: Diagnosis not present

## 2022-04-04 DIAGNOSIS — L578 Other skin changes due to chronic exposure to nonionizing radiation: Secondary | ICD-10-CM | POA: Diagnosis not present

## 2022-04-04 DIAGNOSIS — L570 Actinic keratosis: Secondary | ICD-10-CM | POA: Diagnosis not present

## 2022-04-04 DIAGNOSIS — Z86018 Personal history of other benign neoplasm: Secondary | ICD-10-CM | POA: Diagnosis not present

## 2022-04-04 DIAGNOSIS — L820 Inflamed seborrheic keratosis: Secondary | ICD-10-CM | POA: Diagnosis not present

## 2022-04-04 DIAGNOSIS — D2272 Melanocytic nevi of left lower limb, including hip: Secondary | ICD-10-CM | POA: Diagnosis not present

## 2022-04-04 DIAGNOSIS — D485 Neoplasm of uncertain behavior of skin: Secondary | ICD-10-CM | POA: Diagnosis not present

## 2022-04-04 DIAGNOSIS — D225 Melanocytic nevi of trunk: Secondary | ICD-10-CM | POA: Diagnosis not present

## 2022-04-04 DIAGNOSIS — R58 Hemorrhage, not elsewhere classified: Secondary | ICD-10-CM | POA: Diagnosis not present

## 2022-04-04 DIAGNOSIS — D229 Melanocytic nevi, unspecified: Secondary | ICD-10-CM | POA: Diagnosis not present

## 2022-07-25 DIAGNOSIS — H353132 Nonexudative age-related macular degeneration, bilateral, intermediate dry stage: Secondary | ICD-10-CM | POA: Diagnosis not present

## 2022-07-25 DIAGNOSIS — H43813 Vitreous degeneration, bilateral: Secondary | ICD-10-CM | POA: Diagnosis not present

## 2022-07-25 DIAGNOSIS — H04123 Dry eye syndrome of bilateral lacrimal glands: Secondary | ICD-10-CM | POA: Diagnosis not present

## 2022-07-25 DIAGNOSIS — H26491 Other secondary cataract, right eye: Secondary | ICD-10-CM | POA: Diagnosis not present

## 2022-08-23 ENCOUNTER — Other Ambulatory Visit: Payer: Self-pay | Admitting: Internal Medicine

## 2022-08-23 DIAGNOSIS — Z1231 Encounter for screening mammogram for malignant neoplasm of breast: Secondary | ICD-10-CM

## 2022-09-20 DIAGNOSIS — H40013 Open angle with borderline findings, low risk, bilateral: Secondary | ICD-10-CM | POA: Diagnosis not present

## 2022-10-10 ENCOUNTER — Ambulatory Visit
Admission: RE | Admit: 2022-10-10 | Discharge: 2022-10-10 | Disposition: A | Discharge status: Home / Self Care | Payer: Medicare HMO | Source: Ambulatory Visit | Attending: Internal Medicine | Admitting: Internal Medicine

## 2022-10-10 DIAGNOSIS — Z1231 Encounter for screening mammogram for malignant neoplasm of breast: Secondary | ICD-10-CM

## 2022-11-07 DIAGNOSIS — I4949 Other premature depolarization: Secondary | ICD-10-CM | POA: Diagnosis not present

## 2022-11-07 DIAGNOSIS — H3530 Unspecified macular degeneration: Secondary | ICD-10-CM | POA: Diagnosis not present

## 2022-11-07 DIAGNOSIS — F5101 Primary insomnia: Secondary | ICD-10-CM | POA: Diagnosis not present

## 2022-11-07 DIAGNOSIS — I10 Essential (primary) hypertension: Secondary | ICD-10-CM | POA: Diagnosis not present

## 2022-11-07 DIAGNOSIS — M8589 Other specified disorders of bone density and structure, multiple sites: Secondary | ICD-10-CM | POA: Diagnosis not present

## 2022-11-07 DIAGNOSIS — M5136 Other intervertebral disc degeneration, lumbar region: Secondary | ICD-10-CM | POA: Diagnosis not present

## 2022-11-09 DIAGNOSIS — Z0100 Encounter for examination of eyes and vision without abnormal findings: Secondary | ICD-10-CM | POA: Diagnosis not present

## 2022-11-15 DIAGNOSIS — E875 Hyperkalemia: Secondary | ICD-10-CM | POA: Diagnosis not present

## 2023-01-23 DIAGNOSIS — H43813 Vitreous degeneration, bilateral: Secondary | ICD-10-CM | POA: Diagnosis not present

## 2023-01-23 DIAGNOSIS — H353132 Nonexudative age-related macular degeneration, bilateral, intermediate dry stage: Secondary | ICD-10-CM | POA: Diagnosis not present

## 2023-01-23 DIAGNOSIS — H26491 Other secondary cataract, right eye: Secondary | ICD-10-CM | POA: Diagnosis not present

## 2023-01-23 DIAGNOSIS — H04123 Dry eye syndrome of bilateral lacrimal glands: Secondary | ICD-10-CM | POA: Diagnosis not present

## 2023-02-03 DIAGNOSIS — Z9049 Acquired absence of other specified parts of digestive tract: Secondary | ICD-10-CM | POA: Diagnosis not present

## 2023-02-03 DIAGNOSIS — S72012A Unspecified intracapsular fracture of left femur, initial encounter for closed fracture: Secondary | ICD-10-CM | POA: Diagnosis not present

## 2023-02-03 DIAGNOSIS — Z801 Family history of malignant neoplasm of trachea, bronchus and lung: Secondary | ICD-10-CM | POA: Diagnosis not present

## 2023-02-03 DIAGNOSIS — R079 Chest pain, unspecified: Secondary | ICD-10-CM | POA: Diagnosis not present

## 2023-02-03 DIAGNOSIS — D509 Iron deficiency anemia, unspecified: Secondary | ICD-10-CM | POA: Diagnosis not present

## 2023-02-03 DIAGNOSIS — M5136 Other intervertebral disc degeneration, lumbar region: Secondary | ICD-10-CM | POA: Diagnosis not present

## 2023-02-03 DIAGNOSIS — Y92009 Unspecified place in unspecified non-institutional (private) residence as the place of occurrence of the external cause: Secondary | ICD-10-CM | POA: Diagnosis not present

## 2023-02-03 DIAGNOSIS — I10 Essential (primary) hypertension: Secondary | ICD-10-CM | POA: Diagnosis not present

## 2023-02-03 DIAGNOSIS — Z043 Encounter for examination and observation following other accident: Secondary | ICD-10-CM | POA: Diagnosis not present

## 2023-02-03 DIAGNOSIS — Z01810 Encounter for preprocedural cardiovascular examination: Secondary | ICD-10-CM | POA: Diagnosis not present

## 2023-02-03 DIAGNOSIS — M47816 Spondylosis without myelopathy or radiculopathy, lumbar region: Secondary | ICD-10-CM | POA: Diagnosis not present

## 2023-02-03 DIAGNOSIS — S72002A Fracture of unspecified part of neck of left femur, initial encounter for closed fracture: Secondary | ICD-10-CM | POA: Diagnosis not present

## 2023-02-03 DIAGNOSIS — W010XXA Fall on same level from slipping, tripping and stumbling without subsequent striking against object, initial encounter: Secondary | ICD-10-CM | POA: Diagnosis not present

## 2023-02-03 DIAGNOSIS — S8992XA Unspecified injury of left lower leg, initial encounter: Secondary | ICD-10-CM | POA: Diagnosis not present

## 2023-02-03 DIAGNOSIS — M25552 Pain in left hip: Secondary | ICD-10-CM | POA: Diagnosis not present

## 2023-02-03 DIAGNOSIS — Z888 Allergy status to other drugs, medicaments and biological substances status: Secondary | ICD-10-CM | POA: Diagnosis not present

## 2023-02-03 DIAGNOSIS — M5126 Other intervertebral disc displacement, lumbar region: Secondary | ICD-10-CM | POA: Diagnosis not present

## 2023-02-03 DIAGNOSIS — M8589 Other specified disorders of bone density and structure, multiple sites: Secondary | ICD-10-CM | POA: Diagnosis not present

## 2023-02-03 DIAGNOSIS — M4316 Spondylolisthesis, lumbar region: Secondary | ICD-10-CM | POA: Diagnosis not present

## 2023-02-03 DIAGNOSIS — Z8249 Family history of ischemic heart disease and other diseases of the circulatory system: Secondary | ICD-10-CM | POA: Diagnosis not present

## 2023-02-07 DIAGNOSIS — R531 Weakness: Secondary | ICD-10-CM | POA: Diagnosis not present

## 2023-02-07 DIAGNOSIS — M48061 Spinal stenosis, lumbar region without neurogenic claudication: Secondary | ICD-10-CM | POA: Diagnosis not present

## 2023-02-07 DIAGNOSIS — D62 Acute posthemorrhagic anemia: Secondary | ICD-10-CM | POA: Diagnosis not present

## 2023-02-07 DIAGNOSIS — Z9889 Other specified postprocedural states: Secondary | ICD-10-CM | POA: Diagnosis not present

## 2023-02-07 DIAGNOSIS — D6489 Other specified anemias: Secondary | ICD-10-CM | POA: Diagnosis not present

## 2023-02-07 DIAGNOSIS — R2689 Other abnormalities of gait and mobility: Secondary | ICD-10-CM | POA: Diagnosis not present

## 2023-02-07 DIAGNOSIS — Z789 Other specified health status: Secondary | ICD-10-CM | POA: Diagnosis not present

## 2023-02-07 DIAGNOSIS — Z7409 Other reduced mobility: Secondary | ICD-10-CM | POA: Diagnosis not present

## 2023-02-07 DIAGNOSIS — I10 Essential (primary) hypertension: Secondary | ICD-10-CM | POA: Diagnosis not present

## 2023-02-07 DIAGNOSIS — H3530 Unspecified macular degeneration: Secondary | ICD-10-CM | POA: Diagnosis not present

## 2023-02-07 DIAGNOSIS — M5136 Other intervertebral disc degeneration, lumbar region: Secondary | ICD-10-CM | POA: Diagnosis not present

## 2023-02-07 DIAGNOSIS — M4316 Spondylolisthesis, lumbar region: Secondary | ICD-10-CM | POA: Diagnosis not present

## 2023-02-07 DIAGNOSIS — M8589 Other specified disorders of bone density and structure, multiple sites: Secondary | ICD-10-CM | POA: Diagnosis not present

## 2023-02-07 DIAGNOSIS — S72002A Fracture of unspecified part of neck of left femur, initial encounter for closed fracture: Secondary | ICD-10-CM | POA: Diagnosis not present

## 2023-02-07 DIAGNOSIS — Z9181 History of falling: Secondary | ICD-10-CM | POA: Diagnosis not present

## 2023-02-07 DIAGNOSIS — W19XXXD Unspecified fall, subsequent encounter: Secondary | ICD-10-CM | POA: Diagnosis not present

## 2023-02-07 DIAGNOSIS — Z8719 Personal history of other diseases of the digestive system: Secondary | ICD-10-CM | POA: Diagnosis not present

## 2023-02-07 DIAGNOSIS — E876 Hypokalemia: Secondary | ICD-10-CM | POA: Diagnosis not present

## 2023-02-07 DIAGNOSIS — R238 Other skin changes: Secondary | ICD-10-CM | POA: Diagnosis not present

## 2023-02-07 DIAGNOSIS — S72002D Fracture of unspecified part of neck of left femur, subsequent encounter for closed fracture with routine healing: Secondary | ICD-10-CM | POA: Diagnosis not present

## 2023-02-07 DIAGNOSIS — M47819 Spondylosis without myelopathy or radiculopathy, site unspecified: Secondary | ICD-10-CM | POA: Diagnosis not present

## 2023-02-07 DIAGNOSIS — D509 Iron deficiency anemia, unspecified: Secondary | ICD-10-CM | POA: Diagnosis not present

## 2023-02-07 DIAGNOSIS — Z8781 Personal history of (healed) traumatic fracture: Secondary | ICD-10-CM | POA: Diagnosis not present

## 2023-02-07 DIAGNOSIS — Z736 Limitation of activities due to disability: Secondary | ICD-10-CM | POA: Diagnosis not present

## 2023-02-08 DIAGNOSIS — Z8781 Personal history of (healed) traumatic fracture: Secondary | ICD-10-CM | POA: Diagnosis not present

## 2023-02-09 DIAGNOSIS — Z8781 Personal history of (healed) traumatic fracture: Secondary | ICD-10-CM | POA: Diagnosis not present

## 2023-02-09 DIAGNOSIS — Z789 Other specified health status: Secondary | ICD-10-CM | POA: Diagnosis not present

## 2023-02-09 DIAGNOSIS — Z7409 Other reduced mobility: Secondary | ICD-10-CM | POA: Diagnosis not present

## 2023-02-09 DIAGNOSIS — D6489 Other specified anemias: Secondary | ICD-10-CM | POA: Diagnosis not present

## 2023-02-09 DIAGNOSIS — E876 Hypokalemia: Secondary | ICD-10-CM | POA: Diagnosis not present

## 2023-02-12 DIAGNOSIS — Z8781 Personal history of (healed) traumatic fracture: Secondary | ICD-10-CM | POA: Diagnosis not present

## 2023-02-13 DIAGNOSIS — Z7409 Other reduced mobility: Secondary | ICD-10-CM | POA: Diagnosis not present

## 2023-02-13 DIAGNOSIS — S72002D Fracture of unspecified part of neck of left femur, subsequent encounter for closed fracture with routine healing: Secondary | ICD-10-CM | POA: Diagnosis not present

## 2023-02-13 DIAGNOSIS — E876 Hypokalemia: Secondary | ICD-10-CM | POA: Diagnosis not present

## 2023-02-13 DIAGNOSIS — D62 Acute posthemorrhagic anemia: Secondary | ICD-10-CM | POA: Diagnosis not present

## 2023-02-15 DIAGNOSIS — Z8781 Personal history of (healed) traumatic fracture: Secondary | ICD-10-CM | POA: Diagnosis not present

## 2023-02-26 DIAGNOSIS — D500 Iron deficiency anemia secondary to blood loss (chronic): Secondary | ICD-10-CM | POA: Diagnosis not present

## 2023-02-26 DIAGNOSIS — I10 Essential (primary) hypertension: Secondary | ICD-10-CM | POA: Diagnosis not present

## 2023-02-26 DIAGNOSIS — Z8781 Personal history of (healed) traumatic fracture: Secondary | ICD-10-CM | POA: Diagnosis not present

## 2023-02-26 DIAGNOSIS — M8589 Other specified disorders of bone density and structure, multiple sites: Secondary | ICD-10-CM | POA: Diagnosis not present

## 2023-03-16 DIAGNOSIS — S72002D Fracture of unspecified part of neck of left femur, subsequent encounter for closed fracture with routine healing: Secondary | ICD-10-CM | POA: Diagnosis not present

## 2023-04-04 DIAGNOSIS — M25652 Stiffness of left hip, not elsewhere classified: Secondary | ICD-10-CM | POA: Diagnosis not present

## 2023-04-04 DIAGNOSIS — M25552 Pain in left hip: Secondary | ICD-10-CM | POA: Diagnosis not present

## 2023-04-04 DIAGNOSIS — R2689 Other abnormalities of gait and mobility: Secondary | ICD-10-CM | POA: Diagnosis not present

## 2023-04-06 DIAGNOSIS — R2689 Other abnormalities of gait and mobility: Secondary | ICD-10-CM | POA: Diagnosis not present

## 2023-04-06 DIAGNOSIS — M25652 Stiffness of left hip, not elsewhere classified: Secondary | ICD-10-CM | POA: Diagnosis not present

## 2023-04-06 DIAGNOSIS — M25552 Pain in left hip: Secondary | ICD-10-CM | POA: Diagnosis not present

## 2023-04-09 DIAGNOSIS — M25652 Stiffness of left hip, not elsewhere classified: Secondary | ICD-10-CM | POA: Diagnosis not present

## 2023-04-09 DIAGNOSIS — R2689 Other abnormalities of gait and mobility: Secondary | ICD-10-CM | POA: Diagnosis not present

## 2023-04-09 DIAGNOSIS — M25552 Pain in left hip: Secondary | ICD-10-CM | POA: Diagnosis not present

## 2023-04-10 DIAGNOSIS — D2272 Melanocytic nevi of left lower limb, including hip: Secondary | ICD-10-CM | POA: Diagnosis not present

## 2023-04-10 DIAGNOSIS — Z86018 Personal history of other benign neoplasm: Secondary | ICD-10-CM | POA: Diagnosis not present

## 2023-04-10 DIAGNOSIS — D225 Melanocytic nevi of trunk: Secondary | ICD-10-CM | POA: Diagnosis not present

## 2023-04-10 DIAGNOSIS — D229 Melanocytic nevi, unspecified: Secondary | ICD-10-CM | POA: Diagnosis not present

## 2023-04-10 DIAGNOSIS — L578 Other skin changes due to chronic exposure to nonionizing radiation: Secondary | ICD-10-CM | POA: Diagnosis not present

## 2023-04-10 DIAGNOSIS — Z808 Family history of malignant neoplasm of other organs or systems: Secondary | ICD-10-CM | POA: Diagnosis not present

## 2023-04-10 DIAGNOSIS — D2230 Melanocytic nevi of unspecified part of face: Secondary | ICD-10-CM | POA: Diagnosis not present

## 2023-04-13 DIAGNOSIS — M25552 Pain in left hip: Secondary | ICD-10-CM | POA: Diagnosis not present

## 2023-04-13 DIAGNOSIS — M25652 Stiffness of left hip, not elsewhere classified: Secondary | ICD-10-CM | POA: Diagnosis not present

## 2023-04-13 DIAGNOSIS — R2689 Other abnormalities of gait and mobility: Secondary | ICD-10-CM | POA: Diagnosis not present

## 2023-04-16 DIAGNOSIS — M25552 Pain in left hip: Secondary | ICD-10-CM | POA: Diagnosis not present

## 2023-04-16 DIAGNOSIS — M25652 Stiffness of left hip, not elsewhere classified: Secondary | ICD-10-CM | POA: Diagnosis not present

## 2023-04-16 DIAGNOSIS — R2689 Other abnormalities of gait and mobility: Secondary | ICD-10-CM | POA: Diagnosis not present

## 2023-04-18 DIAGNOSIS — M25552 Pain in left hip: Secondary | ICD-10-CM | POA: Diagnosis not present

## 2023-04-18 DIAGNOSIS — R2689 Other abnormalities of gait and mobility: Secondary | ICD-10-CM | POA: Diagnosis not present

## 2023-04-18 DIAGNOSIS — M25652 Stiffness of left hip, not elsewhere classified: Secondary | ICD-10-CM | POA: Diagnosis not present

## 2023-04-23 DIAGNOSIS — M25552 Pain in left hip: Secondary | ICD-10-CM | POA: Diagnosis not present

## 2023-04-23 DIAGNOSIS — M25652 Stiffness of left hip, not elsewhere classified: Secondary | ICD-10-CM | POA: Diagnosis not present

## 2023-04-23 DIAGNOSIS — R2689 Other abnormalities of gait and mobility: Secondary | ICD-10-CM | POA: Diagnosis not present

## 2023-04-25 DIAGNOSIS — R2689 Other abnormalities of gait and mobility: Secondary | ICD-10-CM | POA: Diagnosis not present

## 2023-04-25 DIAGNOSIS — M25552 Pain in left hip: Secondary | ICD-10-CM | POA: Diagnosis not present

## 2023-04-25 DIAGNOSIS — M25652 Stiffness of left hip, not elsewhere classified: Secondary | ICD-10-CM | POA: Diagnosis not present

## 2023-05-01 DIAGNOSIS — M25652 Stiffness of left hip, not elsewhere classified: Secondary | ICD-10-CM | POA: Diagnosis not present

## 2023-05-01 DIAGNOSIS — M25552 Pain in left hip: Secondary | ICD-10-CM | POA: Diagnosis not present

## 2023-05-01 DIAGNOSIS — R2689 Other abnormalities of gait and mobility: Secondary | ICD-10-CM | POA: Diagnosis not present

## 2023-05-04 DIAGNOSIS — M25552 Pain in left hip: Secondary | ICD-10-CM | POA: Diagnosis not present

## 2023-05-04 DIAGNOSIS — R2689 Other abnormalities of gait and mobility: Secondary | ICD-10-CM | POA: Diagnosis not present

## 2023-05-04 DIAGNOSIS — M25652 Stiffness of left hip, not elsewhere classified: Secondary | ICD-10-CM | POA: Diagnosis not present

## 2023-05-07 DIAGNOSIS — M25652 Stiffness of left hip, not elsewhere classified: Secondary | ICD-10-CM | POA: Diagnosis not present

## 2023-05-07 DIAGNOSIS — R2689 Other abnormalities of gait and mobility: Secondary | ICD-10-CM | POA: Diagnosis not present

## 2023-05-07 DIAGNOSIS — M25552 Pain in left hip: Secondary | ICD-10-CM | POA: Diagnosis not present

## 2023-05-16 DIAGNOSIS — S72002D Fracture of unspecified part of neck of left femur, subsequent encounter for closed fracture with routine healing: Secondary | ICD-10-CM | POA: Diagnosis not present

## 2023-05-21 DIAGNOSIS — M25652 Stiffness of left hip, not elsewhere classified: Secondary | ICD-10-CM | POA: Diagnosis not present

## 2023-05-21 DIAGNOSIS — R2689 Other abnormalities of gait and mobility: Secondary | ICD-10-CM | POA: Diagnosis not present

## 2023-05-21 DIAGNOSIS — M25552 Pain in left hip: Secondary | ICD-10-CM | POA: Diagnosis not present

## 2023-05-24 DIAGNOSIS — R2689 Other abnormalities of gait and mobility: Secondary | ICD-10-CM | POA: Diagnosis not present

## 2023-05-24 DIAGNOSIS — M25652 Stiffness of left hip, not elsewhere classified: Secondary | ICD-10-CM | POA: Diagnosis not present

## 2023-05-24 DIAGNOSIS — M25552 Pain in left hip: Secondary | ICD-10-CM | POA: Diagnosis not present

## 2023-07-31 ENCOUNTER — Other Ambulatory Visit: Payer: Self-pay | Admitting: Internal Medicine

## 2023-07-31 DIAGNOSIS — N6310 Unspecified lump in the right breast, unspecified quadrant: Secondary | ICD-10-CM

## 2023-08-10 ENCOUNTER — Other Ambulatory Visit: Payer: Medicare HMO

## 2023-08-14 ENCOUNTER — Ambulatory Visit
Admission: RE | Admit: 2023-08-14 | Discharge: 2023-08-14 | Disposition: A | Discharge status: Home / Self Care | Payer: Medicare Other | Source: Ambulatory Visit | Attending: Internal Medicine | Admitting: Internal Medicine

## 2023-08-14 ENCOUNTER — Other Ambulatory Visit: Payer: Self-pay | Admitting: Internal Medicine

## 2023-08-14 ENCOUNTER — Ambulatory Visit
Admission: RE | Admit: 2023-08-14 | Discharge: 2023-08-14 | Disposition: A | Discharge status: Home / Self Care | Payer: Self-pay | Source: Ambulatory Visit | Attending: Internal Medicine | Admitting: Internal Medicine

## 2023-08-14 DIAGNOSIS — N6310 Unspecified lump in the right breast, unspecified quadrant: Secondary | ICD-10-CM

## 2023-08-14 DIAGNOSIS — R921 Mammographic calcification found on diagnostic imaging of breast: Secondary | ICD-10-CM

## 2023-08-18 DIAGNOSIS — C801 Malignant (primary) neoplasm, unspecified: Secondary | ICD-10-CM

## 2023-08-18 HISTORY — DX: Malignant (primary) neoplasm, unspecified: C80.1

## 2023-08-23 ENCOUNTER — Ambulatory Visit
Admission: RE | Admit: 2023-08-23 | Discharge: 2023-08-23 | Disposition: A | Discharge status: Home / Self Care | Payer: Medicare Other | Source: Ambulatory Visit | Attending: Internal Medicine | Admitting: Internal Medicine

## 2023-08-23 ENCOUNTER — Ambulatory Visit
Admission: RE | Admit: 2023-08-23 | Discharge: 2023-08-23 | Disposition: A | Discharge status: Home / Self Care | Source: Ambulatory Visit | Attending: Internal Medicine | Admitting: Internal Medicine

## 2023-08-23 ENCOUNTER — Other Ambulatory Visit: Payer: Self-pay | Admitting: Internal Medicine

## 2023-08-23 DIAGNOSIS — N6310 Unspecified lump in the right breast, unspecified quadrant: Secondary | ICD-10-CM

## 2023-08-23 DIAGNOSIS — R921 Mammographic calcification found on diagnostic imaging of breast: Secondary | ICD-10-CM

## 2023-08-23 HISTORY — PX: BREAST BIOPSY: SHX20

## 2023-08-24 ENCOUNTER — Other Ambulatory Visit: Payer: Self-pay | Admitting: Internal Medicine

## 2023-08-24 ENCOUNTER — Telehealth: Payer: Self-pay | Admitting: *Deleted

## 2023-08-24 ENCOUNTER — Encounter: Payer: Self-pay | Admitting: Internal Medicine

## 2023-08-24 DIAGNOSIS — C50911 Malignant neoplasm of unspecified site of right female breast: Secondary | ICD-10-CM

## 2023-08-24 LAB — SURGICAL PATHOLOGY

## 2023-08-24 NOTE — Telephone Encounter (Signed)
 Spoke to patient to confirm upcoming morning [facility] clinic appointment on 3/12, paperwork will be sent via mail.  Gave location and time, also informed patient that the surgeon's office would be calling as well to get information from them similar to the packet that they will be receiving so make sure to do both.  Reminded patient that all providers will be coming to the clinic to see them HERE and if they had any questions to not hesitate to reach back out to myself or their navigators.

## 2023-08-27 ENCOUNTER — Telehealth: Payer: Self-pay | Admitting: *Deleted

## 2023-08-27 ENCOUNTER — Ambulatory Visit
Admission: RE | Admit: 2023-08-27 | Discharge: 2023-08-27 | Disposition: A | Discharge status: Home / Self Care | Source: Ambulatory Visit | Attending: Internal Medicine | Admitting: Internal Medicine

## 2023-08-27 ENCOUNTER — Encounter: Payer: Self-pay | Admitting: *Deleted

## 2023-08-27 DIAGNOSIS — C50911 Malignant neoplasm of unspecified site of right female breast: Secondary | ICD-10-CM

## 2023-08-27 NOTE — Telephone Encounter (Signed)
 Spoke to patient to confirm upcoming morning [facility] clinic appointment on 3/12, paperwork will be sent via mail.  Gave location and time, also informed patient that the surgeon's office would be calling as well to get information from them similar to the packet that they will be receiving so make sure to do both.  Reminded patient that all providers will be coming to the clinic to see them HERE and if they had any questions to not hesitate to reach back out to myself or their navigators.

## 2023-08-27 NOTE — Progress Notes (Signed)
 Radiation Oncology         [phone] ________________________________  Multidisciplinary Breast Oncology Clinic [facility]) Initial Outpatient Consultation  Name: Heather Lewis MRN: [id number]  Date: 08/29/2023  DOB: [birth year]  JX:BJYNW, Coralee North, MD  Emelia Loron, MD   REFERRING PHYSICIAN: Emelia Loron, MD  DIAGNOSIS: There were no encounter diagnoses.  Stage *** Right Breast UOQ, Invasive Ductal Carcinoma, ER- / PR- / Her2-, Grade 3  No diagnosis found.  HISTORY OF PRESENT ILLNESS::Heather Lewis is a 78 y.o. female who is presenting to the office today for evaluation of her newly diagnosed breast cancer. She is accompanied by ***. She is doing well overall.   Patient presented with a 2-3 week history of a palpable abnormality in the right breast.She underwent bilateral diagnostic mammography with tomography and breast ultrasonography at on 08/14/2023 showing: A highly suspicious 1.8 cm palpable mass in the right breast at the 10 o'clock position, a highly suspicious 0.9 cm mass adjacent to the dominant palpable mass at the 10 o'clock position; and injured terminate grouped calcifications in the central outer right breast at the posterior depth; and no right axillary lymphadenopathy.  Biopsy on 08/23/2023 showed: High grade invasive poorly differentiated ductal adenocarcinoma with necrosis and associated high-grade ductal carcinoma in situ. Prognostic indicators significant for: estrogen receptor negative and progesterone receptor negative. Proliferation marker Ki67 at 30%. HER2 negative.  Menarche: *** years old Age at first live birth: *** years old GP: *** LMP: *** Contraceptive: *** HRT: ***   The patient was referred today for presentation in the multidisciplinary conference.  Radiology studies and pathology slides were presented there for review and discussion of treatment options.  A consensus was discussed regarding potential next steps.  PREVIOUS RADIATION THERAPY:  {EXAM; YES/NO:19492::"No"}  PAST MEDICAL HISTORY:  Past Medical History:  Diagnosis Date   Age-related macular degeneration    Bunion of right foot 08/2017   Complication of anesthesia    hard to wake up from colonoscopy when Propofol not used   Dental crowns present    Metatarsalgia of right foot 08/2017   2nd toe   Migraines    with change of weather   TMJ syndrome     PAST SURGICAL HISTORY: Past Surgical History:  Procedure Laterality Date   APPENDECTOMY  1960   BREAST BIOPSY Right 08/23/2023   [place] RT BREAST BX W LOC DEV 1ST LESION IMG BX SPEC US GUIDE 08/23/2023 [facility]   BREAST BIOPSY Right 08/23/2023   [place] RT BREAST BX W LOC DEV EA ADD LESION IMG BX SPEC US GUIDE 08/23/2023 [facility]   BREAST BIOPSY Right 08/23/2023   MM RT BREAST BX W LOC DEV 1ST LESION IMAGE BX SPEC STEREO GUIDE 08/23/2023 [facility]   BUNIONECTOMY WITH WEIL OSTEOTOMY Right 09/06/2017   Procedure: Right Lapidus, Modified Orpha Bur;  Surgeon: Toni Arthurs, MD;  Location: [facility];  Service: Orthopedics;  Laterality: Right;   COLONOSCOPY WITH PROPOFOL  08/08/2016   HAMMERTOE RECONSTRUCTION WITH WEIL OSTEOTOMY  06/27/2018   Procedure: LEFT SECOND HAMMERTOE RECONSTRUCTION WITH WEIL OSTEOTOMY;  Surgeon: Toni Arthurs, MD;  Location: [facility];  Service: Orthopedics;;   TARSAL METATARSAL ARTHRODESIS Left 06/27/2018   Procedure: Left modified McBride and lapidus 1st tarsometatarsal arthrodesis;  Surgeon: Toni Arthurs, MD;  Location: [facility];  Service: Orthopedics;  Laterality: Left;    TONSILLECTOMY AND ADENOIDECTOMY  1954    FAMILY HISTORY:  Family History  Problem Relation Age of  Onset   Colon polyps Mother    Lung cancer Mother    Colon polyps Father    Colon polyps Brother     SOCIAL HISTORY:  Social History   Socioeconomic History   Marital status: Married    Spouse name: Not on file   Number of children: Not on file    Years of education: Not on file   Highest education level: Not on file  Occupational History   Not on file  Tobacco Use   Smoking status: Never   Smokeless tobacco: Never  Vaping Use   Vaping status: Never Used  Substance and Sexual Activity   Alcohol use: Yes    Comment: socially   Drug use: No   Sexual activity: Not on file  Other Topics Concern   Not on file  Social History Narrative   Not on file   Social Drivers of Health   Financial Resource Strain: Not on file  Food Insecurity: Low Risk  (02/26/2023)   Received from [facility]   Hunger Vital Sign    Worried About Running Out of Food in the Last Year: Never true    Ran Out of Food in the Last Year: Never true  Transportation Needs: No Transportation Needs (02/26/2023)   Received from [facility]    In the past 12 months, has lack of reliable transportation kept you from medical appointments, meetings, work or from getting things needed for daily living? : No  Physical Activity: Not on file  Stress: Not on file  Social Connections: Not on file    ALLERGIES:  Allergies  Allergen Reactions   Nitrates, Organic Other (See Comments)    MIGRAINES    MEDICATIONS:  Current Outpatient Medications  Medication Sig Dispense Refill   aspirin EC 81 MG tablet Take 1 tablet (81 mg total) by mouth 2 (two) times daily. 84 tablet 0   cholecalciferol (VITAMIN D) 1000 units tablet Take 1,000 Units by mouth daily.     Coenzyme Q10 (COQ10 PO) Take by mouth.     docusate sodium (COLACE) 100 MG capsule Take 1 capsule (100 mg total) by mouth 2 (two) times daily. While taking narcotic pain medicine. 30 capsule 0   ibuprofen (ADVIL,MOTRIN) 800 MG tablet Take 800 mg by mouth every 8 (eight) hours as needed. migraines      senna (SENOKOT) 8.6 MG TABS tablet Take 2 tablets (17.2 mg total) by mouth 2 (two) times daily. 30 each 0   TURMERIC PO Take by mouth.     No current facility-administered medications for this  visit.    REVIEW OF SYSTEMS: A 10+ POINT REVIEW OF SYSTEMS WAS OBTAINED including neurology, dermatology, psychiatry, cardiac, respiratory, lymph, extremities, GI, GU, musculoskeletal, constitutional, reproductive, HEENT. On the provided form, she reports ***. She denies *** and any other symptoms.    PHYSICAL EXAM:  vitals were not taken for this visit.  {may need to copy over vitals} Lungs are clear to auscultation bilaterally. Heart has regular rate and rhythm. No palpable cervical, supraclavicular, or axillary adenopathy. Abdomen soft, non-tender, normal bowel sounds. Breast: *** breast with no palpable mass, nipple discharge, or bleeding. *** breast with ***.   KPS = ***  100 - Normal; no complaints; no evidence of disease. 90   - Able to carry on normal activity; minor signs or symptoms of disease. 80   - Normal activity with effort; some signs or symptoms of disease. 79   - Cares for  self; unable to carry on normal activity or to do active work. 60   - Requires occasional assistance, but is able to care for most of his personal needs. 50   - Requires considerable assistance and frequent medical care. 40   - Disabled; requires special care and assistance. 30   - Severely disabled; hospital admission is indicated although death not imminent. 20   - Very sick; hospital admission necessary; active supportive treatment necessary. 10   - Moribund; fatal processes progressing rapidly. 0     - Dead  Karnofsky DA, Abelmann WH, Craver LS and Burchenal JH [phone]) The use of the nitrogen mustards in the palliative treatment of carcinoma: with particular reference to bronchogenic carcinoma Cancer 1 [phone]  LABORATORY DATA:  No results found for: "WBC", "HGB", "HCT", "MCV", "PLT" No results found for: "NA", "K", "CL", "CO2" No results found for: "ALT", "AST", "GGT", "ALKPHOS", "BILITOT"  PULMONARY FUNCTION TEST:   Review Flowsheet        No data to display          RADIOGRAPHY: MM 3D  DIAGNOSTIC MAMMOGRAM UNILATERAL LEFT BREAST Result Date: 08/27/2023 CLINICAL DATA:  78 year old female with newly diagnosed RIGHT breast cancer. Patient presents for evaluation of the LEFT breast. EXAM: DIGITAL DIAGNOSTIC UNILATERAL LEFT MAMMOGRAM WITH TOMOSYNTHESIS AND CAD TECHNIQUE: Left digital diagnostic mammography and breast tomosynthesis was performed. The images were evaluated with computer-aided detection. COMPARISON:  Previous exam(s). ACR Breast Density Category b: There are scattered areas of fibroglandular density. FINDINGS: Full field views of the LEFT breast demonstrates no new or suspicious findings. No suspicious mass, distortion or worrisome calcifications are noted. IMPRESSION: No mammographic evidence of LEFT breast malignancy. RECOMMENDATION: Treatment plan for newly diagnosed RIGHT breast cancer. I have discussed the findings and recommendations with the patient. If applicable, a reminder letter will be sent to the patient regarding the next appointment. BI-RADS CATEGORY  1: Negative. Electronically Signed   By: Harmon Pier M.D.   On: 08/27/2023 10:54   MM RT BREAST BX W LOC DEV 1ST LESION IMAGE BX SPEC STEREO GUIDE Result Date: 08/23/2023 CLINICAL DATA:  Patient presents for stereotactic guided core biopsy of a 0.3 cm group of calcifications in the central outer right breast. EXAM: RIGHT BREAST STEREOTACTIC CORE NEEDLE BIOPSY COMPARISON:  Previous exam(s). FINDINGS: The patient and I discussed the procedure of stereotactic-guided biopsy including benefits and alternatives. We discussed the high likelihood of a successful procedure. We discussed the risks of the procedure including infection, bleeding, tissue injury, clip migration, and inadequate sampling. Informed written consent was given. The usual time out protocol was performed immediately prior to the procedure. Using sterile technique and 1% Lidocaine as local anesthetic, under stereotactic guidance, a 9 gauge vacuum assisted device  was used to perform core needle biopsy of the calcifications in the central right breast using a lateral to medial approach. Specimen radiograph was performed showing the presence of calcifications. Specimens with calcifications are identified for pathology. Lesion quadrant: Lower outer At the conclusion of the procedure, an x shaped tissue marker clip was deployed into the biopsy cavity. Follow-up 2-view mammogram was performed and dictated separately. IMPRESSION: Stereotactic-guided biopsy of the calcifications in the central outer right breast. No apparent complications. Electronically Signed   By: Edwin Cap M.D.   On: 08/23/2023 08:34   [place] RT BREAST BX W LOC DEV 1ST LESION IMG BX SPEC US GUIDE Result Date: 08/23/2023 CLINICAL DATA:  Patient presents for ultrasound guided core biopsy of 2 adjacent  masses in the right breast at the 10 position. EXAM: ULTRASOUND GUIDED RIGHT BREAST CORE NEEDLE BIOPSY COMPARISON:  Previous exam(s). PROCEDURE: I met with the patient and we discussed the procedure of ultrasound-guided biopsy, including benefits and alternatives. We discussed the high likelihood of a successful procedure. We discussed the risks of the procedure, including infection, bleeding, tissue injury, clip migration, and inadequate sampling. Informed written consent was given. The usual time-out protocol was performed immediately prior to the procedure. SITE 1: RIGHT BREAST 10 O'CLOCK (DEEP) MASS: Lesion quadrant: UPPER OUTER Using sterile technique and 1% Lidocaine as local anesthetic, under direct ultrasound visualization, a 14 gauge spring-loaded device was used to perform biopsy of the 0.9 cm in the right breast at 10 position using a lateral to medial approach. At the conclusion of the procedure a ribbon shaped tissue marker clip was deployed into the biopsy cavity. Follow up 2 view mammogram was performed and dictated separately. SITE 2: RIGHT BREAST 10 O'CLOCK (SUPERFICIAL) MASS: Lesion quadrant:  UPPER OUTER Using sterile technique and 1% Lidocaine as local anesthetic, under direct ultrasound visualization, a 14 gauge spring-loaded device was used to perform biopsy of the 1.8 cm mass in the right breast at the 10 position using a lateral 2 medial approach. At the conclusion of the procedure a heart shaped tissue marker clip was deployed into the biopsy cavity. Follow up 2 view mammogram was performed and dictated separately. IMPRESSION: 1. Ultrasound-guided core biopsy of the 0.9 cm mass right breast at 10 o'clock, at siteof ribbon shaped biopsy marking clip. 2. Ultrasound-guided core biopsy of the 1.8 cm mass in the right breast at 10 o'clock, at site of heart shaped biopsy marking clip. Electronically Signed   By: Edwin Cap M.D.   On: 08/23/2023 08:34   [place] RT BREAST BX W LOC DEV EA ADD LESION IMG BX SPEC US GUIDE Result Date: 08/23/2023 CLINICAL DATA:  Patient presents for ultrasound guided core biopsy of 2 adjacent masses in the right breast at the 10 position. EXAM: ULTRASOUND GUIDED RIGHT BREAST CORE NEEDLE BIOPSY COMPARISON:  Previous exam(s). PROCEDURE: I met with the patient and we discussed the procedure of ultrasound-guided biopsy, including benefits and alternatives. We discussed the high likelihood of a successful procedure. We discussed the risks of the procedure, including infection, bleeding, tissue injury, clip migration, and inadequate sampling. Informed written consent was given. The usual time-out protocol was performed immediately prior to the procedure. SITE 1: RIGHT BREAST 10 O'CLOCK (DEEP) MASS: Lesion quadrant: UPPER OUTER Using sterile technique and 1% Lidocaine as local anesthetic, under direct ultrasound visualization, a 14 gauge spring-loaded device was used to perform biopsy of the 0.9 cm in the right breast at 10 position using a lateral to medial approach. At the conclusion of the procedure a ribbon shaped tissue marker clip was deployed into the biopsy cavity. Follow  up 2 view mammogram was performed and dictated separately. SITE 2: RIGHT BREAST 10 O'CLOCK (SUPERFICIAL) MASS: Lesion quadrant: UPPER OUTER Using sterile technique and 1% Lidocaine as local anesthetic, under direct ultrasound visualization, a 14 gauge spring-loaded device was used to perform biopsy of the 1.8 cm mass in the right breast at the 10 position using a lateral 2 medial approach. At the conclusion of the procedure a heart shaped tissue marker clip was deployed into the biopsy cavity. Follow up 2 view mammogram was performed and dictated separately. IMPRESSION: 1. Ultrasound-guided core biopsy of the 0.9 cm mass right breast at 10 o'clock, at siteof ribbon shaped  biopsy marking clip. 2. Ultrasound-guided core biopsy of the 1.8 cm mass in the right breast at 10 o'clock, at site of heart shaped biopsy marking clip. Electronically Signed   By: Edwin Cap M.D.   On: 08/23/2023 08:34   MM CLIP PLACEMENT RIGHT Result Date: 08/23/2023 CLINICAL DATA:  Post ultrasound-guided core biopsy of a mass in the right breast at10 oc deep, ultrasound-guided core biopsy of a mass in the right breast 10 o'clock superficial, and stereotactic guided core biopsy of calcifications in the central outer right breast. EXAM: 3D DIAGNOSTIC RIGHT MAMMOGRAM POST ULTRASOUND BIOPSY COMPARISON:  Previous exam(s). ACR Breast Density Category b: There are scattered areas of fibroglandular density. FINDINGS: 3D Mammographic images were obtained following ultrasound-guided core biopsy of a mass in the right breast at10 oc deep, ultrasound-guided core biopsy of a mass in the right breast at 10 o'clock superficial, and stereotactic guided core biopsy of calcifications in the central outer right breast. A ribbon shaped biopsy marking clip is present at the site of the biopsied mass in the right breast at 10 o'clock deep (site 1). A heart shaped biopsy marking clip is present at the site of the biopsied mass in the right breast at 10 o'clock  superficial (site 2). An x shaped biopsy marking clip is present at the site of the biopsied calcifications in the central outer right breast. IMPRESSION: 1. Ribbon shaped biopsy marking clip is present at the site of the biopsied mass in the right breast at 10 o'clock deep (site 1). 2. Heart shaped biopsy marking clip is present at the site of the biopsied mass in the right breast at 10 o'clock superficial (site 2). 3. X shaped biopsy marking clip is present at the site of the biopsied calcifications in the central outer right breast. Final Assessment: Post Procedure Mammograms for Marker Placement Electronically Signed   By: Edwin Cap M.D.   On: 08/23/2023 08:34   MM 3D DIAGNOSTIC MAMMOGRAM UNILATERAL RIGHT BREAST Result Date: 08/14/2023 CLINICAL DATA:  Patient reports palpable abnormality in the right breast noted approximately 2-3 weeks ago. Most recent screening mammogram was performed April 2024 and was negative. EXAM: DIGITAL DIAGNOSTIC UNILATERAL RIGHT MAMMOGRAM WITH TOMOSYNTHESIS AND CAD; ULTRASOUND RIGHT BREAST LIMITED TECHNIQUE: Right digital diagnostic mammography and breast tomosynthesis was performed. The images were evaluated with computer-aided detection. ; Targeted ultrasound examination of the right breast was performed COMPARISON:  Previous exam(s). ACR Breast Density Category b: There are scattered areas of fibroglandular density. FINDINGS: A metallic BB applied to the skin denotes the patient indicated area of palpable abnormality in the upper outer right breast at middle depth. Underlying the skin marker is a 1.6 cm irregular mass with associated fine linear and pleomorphic calcifications. Also noted in the upper outer right breast at middle depth is a oval mass with obscured margins measuring approximately 0.8 cm. Additionally, in the central outer breast at middle depth, there is a group punctate and amorphous calcifications spanning 0.3 cm. These findings cannot be confirmed as  stable compared to prior exams. On physical exam, I appreciate a hard fixed mass in the upper outer right breast. Targeted ultrasound of the right breast at the 10 o'clock position 5 cm from the nipple, at the patient indicated area palpable abnormality, demonstrates a 1.8 x 0.8 x 1.5 cm irregular hypoechoic mass with multiple echogenic foci, corresponding to the calcified mass underlying the skin marker on mammogram. Adjacent to this mass at the 10 o'clock position 5 cm from the nipple  is a 0.9 x 0.8 x 0.8 cm irregular hypoechoic mass with indistinct margins, corresponding to the obscured mass in the upper outer breast at middle depth. The distance between the 2 masses measures 0.8 cm. Targeted ultrasound the right axilla demonstrates lymph nodes with normal morphology. IMPRESSION: 1. Highly suspicious 1.8 cm palpable mass right breast 10 o'clock position. 2. Highly suspicious 0.9 cm mass noted adjacent to the dominant palpable mass at the 10 o'clock position. The distance between the 2 masses measures less than 1 cm. 3. Indeterminate grouped calcifications in the central outer right breast at posterior depth. 4. No right axillary lymphadenopathy. RECOMMENDATION: Ultrasound-guided biopsy of the palpable mass of the right breast 10 o'clock position and mammogram guided biopsy of the grouped calcifications in the central outer right breast. Biopsies are scheduled for 08/23/2023. With malignant result, if it would change clinical management, the probable satellite lesion at the right breast 10 o'clock position could also be sampled under ultrasound guidance. I have discussed the findings and recommendations with the patient and her husband. The biopsy procedure was discussed with the patient and questions were answered. Patient expressed their understanding of the biopsy recommendation. BI-RADS CATEGORY  5: Highly suggestive of malignancy. Electronically Signed   By: Sherron Ales M.D.   On: 08/14/2023 13:05   [place]  LIMITED ULTRASOUND INCLUDING AXILLA RIGHT BREAST Result Date: 08/14/2023 CLINICAL DATA:  Patient reports palpable abnormality in the right breast noted approximately 2-3 weeks ago. Most recent screening mammogram was performed April 2024 and was negative. EXAM: DIGITAL DIAGNOSTIC UNILATERAL RIGHT MAMMOGRAM WITH TOMOSYNTHESIS AND CAD; ULTRASOUND RIGHT BREAST LIMITED TECHNIQUE: Right digital diagnostic mammography and breast tomosynthesis was performed. The images were evaluated with computer-aided detection. ; Targeted ultrasound examination of the right breast was performed COMPARISON:  Previous exam(s). ACR Breast Density Category b: There are scattered areas of fibroglandular density. FINDINGS: A metallic BB applied to the skin denotes the patient indicated area of palpable abnormality in the upper outer right breast at middle depth. Underlying the skin marker is a 1.6 cm irregular mass with associated fine linear and pleomorphic calcifications. Also noted in the upper outer right breast at middle depth is a oval mass with obscured margins measuring approximately 0.8 cm. Additionally, in the central outer breast at middle depth, there is a group punctate and amorphous calcifications spanning 0.3 cm. These findings cannot be confirmed as stable compared to prior exams. On physical exam, I appreciate a hard fixed mass in the upper outer right breast. Targeted ultrasound of the right breast at the 10 o'clock position 5 cm from the nipple, at the patient indicated area palpable abnormality, demonstrates a 1.8 x 0.8 x 1.5 cm irregular hypoechoic mass with multiple echogenic foci, corresponding to the calcified mass underlying the skin marker on mammogram. Adjacent to this mass at the 10 o'clock position 5 cm from the nipple is a 0.9 x 0.8 x 0.8 cm irregular hypoechoic mass with indistinct margins, corresponding to the obscured mass in the upper outer breast at middle depth. The distance between the 2 masses measures  0.8 cm. Targeted ultrasound the right axilla demonstrates lymph nodes with normal morphology. IMPRESSION: 1. Highly suspicious 1.8 cm palpable mass right breast 10 o'clock position. 2. Highly suspicious 0.9 cm mass noted adjacent to the dominant palpable mass at the 10 o'clock position. The distance between the 2 masses measures less than 1 cm. 3. Indeterminate grouped calcifications in the central outer right breast at posterior depth. 4. No right axillary lymphadenopathy.  RECOMMENDATION: Ultrasound-guided biopsy of the palpable mass of the right breast 10 o'clock position and mammogram guided biopsy of the grouped calcifications in the central outer right breast. Biopsies are scheduled for 08/23/2023. With malignant result, if it would change clinical management, the probable satellite lesion at the right breast 10 o'clock position could also be sampled under ultrasound guidance. I have discussed the findings and recommendations with the patient and her husband. The biopsy procedure was discussed with the patient and questions were answered. Patient expressed their understanding of the biopsy recommendation. BI-RADS CATEGORY  5: Highly suggestive of malignancy. Electronically Signed   By: Sherron Ales M.D.   On: 08/14/2023 13:05      IMPRESSION: ***   Patient will be a good candidate for breast conservation with radiotherapy to *** breast. We discussed the general course of radiation, potential side effects, and toxicities with radiation and the patient is interested in this approach. ***   PLAN:  ***   ------------------------------------------------   Bryan Lemma, PA-C   Billie Lade, PhD, MD   [facility]  Radiation Oncology Direct Dial: [phone]  Fax: [phone] [email or web address]

## 2023-08-28 ENCOUNTER — Encounter: Payer: Self-pay | Admitting: *Deleted

## 2023-08-28 DIAGNOSIS — C50411 Malignant neoplasm of upper-outer quadrant of right female breast: Secondary | ICD-10-CM | POA: Insufficient documentation

## 2023-08-28 NOTE — Progress Notes (Unsigned)
 [facility] CONSULT NOTE  Patient Care Team: Lucianne Lei, MD as PCP - General (Internal Medicine) Pershing Proud, RN as Oncology Nurse Navigator Donnelly Angelica, RN as Oncology Nurse Navigator Rachel Moulds, MD as Consulting Physician (Hematology and Oncology) Emelia Loron, MD as Consulting Physician (General Surgery) Antony Blackbird, MD as Consulting Physician (Radiation Oncology)  CHIEF COMPLAINTS/PURPOSE OF CONSULTATION:  Right breast cancer.  ASSESSMENT & PLAN:  No problem-specific Assessment & Plan notes found for this encounter.  No orders of the defined types were placed in this encounter.    HISTORY OF PRESENTING ILLNESS:  Heather Lewis 78 y.o. female is here because of newly diagnosed breast cancer  Oncology History  Malignant neoplasm of upper-outer quadrant of right breast in female, estrogen receptor negative (HCC)  08/28/2023 Initial Diagnosis   Malignant neoplasm of upper-outer quadrant of right breast in female, estrogen receptor negative (HCC)    Mammogram   Highly suspicious 1.8 cm palpable mass right breast 10 o'clock position. Highly suspicious 0.9 cm mass noted adjacent to the dominant palpable mass at the 10 o'clock position. The distance between the 2 masses measures less than 1 cm. Indeterminate grouped calcifications in the central outer right breast at posterior depth.    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  History of Present Illness       REVIEW OF SYSTEMS:   Constitutional: Denies fevers, chills or abnormal night sweats Eyes: Denies blurriness of vision, double vision or watery eyes Ears, nose, mouth, throat, and face: Denies mucositis or sore throat Respiratory: Denies cough, dyspnea or wheezes Cardiovascular: Denies palpitation, chest discomfort or  lower extremity swelling Gastrointestinal:  Denies nausea, heartburn or change in bowel habits Skin: Denies abnormal skin rashes Lymphatics: Denies new lymphadenopathy or easy bruising Neurological:Denies numbness, tingling or new weaknesses Behavioral/Psych: Mood is stable, no new changes  All other systems were reviewed with the patient and are negative.  MEDICAL HISTORY:  Past Medical History:  Diagnosis Date   Age-related macular degeneration    Bunion of right foot 08/2017   Complication of anesthesia    hard to wake up from colonoscopy when Propofol not used   Dental crowns present    Metatarsalgia of right foot 08/2017   2nd toe   Migraines    with change of weather   TMJ syndrome     SURGICAL HISTORY: Past Surgical History:  Procedure Laterality Date   APPENDECTOMY  1960   BREAST BIOPSY Right 08/23/2023   [place] RT BREAST BX W LOC DEV 1ST LESION IMG BX SPEC US GUIDE 08/23/2023 [facility]   BREAST BIOPSY Right 08/23/2023   [place] RT BREAST BX W LOC DEV EA ADD LESION IMG BX SPEC US GUIDE 08/23/2023 [facility]   BREAST BIOPSY Right 08/23/2023   MM RT BREAST BX W LOC DEV 1ST LESION IMAGE BX SPEC STEREO GUIDE 08/23/2023 [facility]   BUNIONECTOMY WITH WEIL OSTEOTOMY Right 09/06/2017   Procedure: Right Lapidus, Modified Orpha Bur;  Surgeon: Toni Arthurs, MD;  Location: [facility];  Service: Orthopedics;  Laterality: Right;   COLONOSCOPY WITH PROPOFOL  08/08/2016   HAMMERTOE RECONSTRUCTION WITH WEIL OSTEOTOMY  06/27/2018   Procedure: LEFT SECOND HAMMERTOE RECONSTRUCTION WITH WEIL OSTEOTOMY;  Surgeon: Toni Arthurs, MD;  Location: [facility];  Service: Orthopedics;;   TARSAL METATARSAL ARTHRODESIS Left 06/27/2018   Procedure: Left modified McBride and lapidus 1st tarsometatarsal arthrodesis;  Surgeon: Toni Arthurs, MD;  Location: [facility];  Service: Orthopedics;  Laterality: Left;    TONSILLECTOMY AND ADENOIDECTOMY   1954    SOCIAL HISTORY: Social History   Socioeconomic History   Marital status: Married    Spouse name: Not on file   Number of children: Not on file   Years of education: Not on file   Highest education level: Not on file  Occupational History   Not on file  Tobacco Use   Smoking status: Never   Smokeless tobacco: Never  Vaping Use   Vaping status: Never Used  Substance and Sexual Activity   Alcohol use: Yes    Comment: socially   Drug use: No   Sexual activity: Not on file  Other Topics Concern   Not on file  Social History Narrative   Not on file   Social Drivers of Health   Financial Resource Strain: Not on file  Food Insecurity: Low Risk  (02/26/2023)   Received from [facility]   Hunger Vital Sign    Worried About Running Out of Food in the Last Year: Never true    Ran Out of Food in the Last Year: Never true  Transportation Needs: No Transportation Needs (02/26/2023)   Received from [facility]    In the past 12 months, has lack of reliable transportation kept you from medical appointments, meetings, work or from getting things needed for daily living? : No  Physical Activity: Not on file  Stress: Not on file  Social Connections: Not on file  Intimate Partner Violence: Not on file    FAMILY HISTORY: Family History  Problem Relation Age of Onset   Colon polyps Mother    Lung cancer Mother    Colon polyps Father    Colon polyps Brother     ALLERGIES:  is allergic to nitrates, organic.  MEDICATIONS:  Current Outpatient Medications  Medication Sig Dispense Refill   aspirin EC 81 MG tablet Take 1 tablet (81 mg total) by mouth 2 (two) times daily. 84 tablet 0   cholecalciferol (VITAMIN D) 1000 units tablet Take 1,000 Units by mouth daily.     Coenzyme Q10 (COQ10 PO) Take by mouth.     docusate sodium (COLACE) 100 MG capsule Take 1 capsule (100 mg total) by mouth 2 (two) times daily. While taking narcotic pain medicine. 30 capsule 0    ibuprofen (ADVIL,MOTRIN) 800 MG tablet Take 800 mg by mouth every 8 (eight) hours as needed. migraines      senna (SENOKOT) 8.6 MG TABS tablet Take 2 tablets (17.2 mg total) by mouth 2 (two) times daily. 30 each 0   TURMERIC PO Take by mouth.     No current facility-administered medications for this visit.     PHYSICAL EXAMINATION: ECOG PERFORMANCE STATUS: {CHL ONC ECOG PS:[phone]}  There were no vitals filed for this visit. There were no vitals filed for this visit.  GENERAL:alert, no distress and comfortable SKIN: skin color, texture, turgor are normal, no rashes or significant lesions EYES: normal, conjunctiva are pink and non-injected, sclera clear OROPHARYNX:no exudate, no erythema and lips, buccal mucosa, and tongue normal  NECK: supple, thyroid normal size, non-tender, without nodularity LYMPH:  no palpable lymphadenopathy in the cervical, axillary or inguinal LUNGS: clear to auscultation and percussion with normal breathing effort HEART: regular rate &  rhythm and no murmurs and no lower extremity edema ABDOMEN:abdomen soft, non-tender and normal bowel sounds Musculoskeletal:no cyanosis of digits and no clubbing  PSYCH: alert & oriented x 3 with fluent speech NEURO: no focal motor/sensory deficits  LABORATORY DATA:  I have reviewed the data as listed No results found for: "WBC", "HGB", "HCT", "MCV", "PLT"   Chemistry   No results found for: "NA", "K", "CL", "CO2", "BUN", "CREATININE", "GLU" No results found for: "CALCIUM", "ALKPHOS", "AST", "ALT", "BILITOT"     RADIOGRAPHIC STUDIES: I have personally reviewed the radiological images as listed and agreed with the findings in the report. MM RT BREAST BX W LOC DEV 1ST LESION IMAGE BX SPEC STEREO GUIDE Addendum Date: 08/28/2023 ADDENDUM REPORT: 08/28/2023 07:22 ADDENDUM: PATHOLOGY revealed: Site 1. 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 NEGATIVE FOR ANGIOLYMPHATIC INVASION. TUMOR MEASURES 5.5 MM IN GREATEST LINEAR EXTENT. Pathology results are CONCORDANT with imaging findings, per Dr. Edwin Cap. PATHOLOGY revealed: Site 2. Breast, right, needle core biopsy, 10 o'clock (superficial) mass (heart clip) : HIGH-GRADE DUCTAL CARCINOMA IN SITU, SOLID TYPE WITH NECROSIS. NEGATIVE FOR DEFINITIVE INVASION. DCIS MEASURES 10 MM IN GREATEST LINEAR EXTENT. Pathology results are CONCORDANT with imaging findings, per Dr. Edwin Cap. PATHOLOGY revealed: Site 3. Breast, right, needle core biopsy, central outer calcs (x clip) : HIGH-GRADE DUCTAL CARCINOMA IN SITU, SOLID TYPE WITH NECROSIS. NEGATIVE FOR INVASION. DCIS MEASURES 4 MM IN GREATEST LINEAR EXTENT. Pathology results are CONCORDANT with imaging findings, per Dr. Edwin Cap. Pathology results and recommendations below were discussed with patient by telephone on 08/24/2023 by Rene Kocher RN. Patient reported biopsy site within normal limits with slight tenderness at the site. Post biopsy care instructions were reviewed, questions were answered and my direct phone number was provided to patient. Patient was instructed to call [facility] if any concerns or questions arise related to the biopsy. RECOMMENDATIONS: 1. Surgical and oncological consultation. Patient was referred to the [facility] at [facility] with appointment on 08/29/2023. 2. Please schedule patient for LEFT breast diagnostic mammogram as patient has not has imaging on this breast since 2024. Patient is scheduled for Monday 08/27/2023 and is aware. 3. Recommend pretreatment bilateral breast MRI with and without contrast to determine extent of breast disease given diagnosis of high grade DCIS. Pathology results reported by Randa Lynn RN on 08/24/2023. Electronically Signed   By: Edwin Cap M.D.   On: 08/28/2023 07:22   Result Date:  08/28/2023 CLINICAL DATA:  Patient presents for stereotactic guided core biopsy of a 0.3 cm group of calcifications in the central outer right breast. EXAM: RIGHT BREAST STEREOTACTIC CORE NEEDLE BIOPSY COMPARISON:  Previous exam(s). FINDINGS: The patient and I discussed the procedure of stereotactic-guided biopsy including benefits and alternatives. We discussed the high likelihood of a successful procedure. We discussed the risks of the procedure including infection, bleeding, tissue injury, clip migration, and inadequate sampling. Informed written consent was given. The usual time out protocol was performed immediately prior to the procedure. Using sterile technique and 1% Lidocaine as local anesthetic, under stereotactic guidance, a 9 gauge vacuum assisted device was used to perform core needle biopsy of the calcifications in the central right breast using a lateral to medial approach. Specimen radiograph was performed showing the presence of calcifications. Specimens with calcifications are identified for pathology. Lesion quadrant: Lower outer At the conclusion of the procedure,  an x shaped tissue marker clip was deployed into the biopsy cavity. Follow-up 2-view mammogram was performed and dictated separately. IMPRESSION: Stereotactic-guided biopsy of the calcifications in the central outer right breast. No apparent complications. Electronically Signed: By: Edwin Cap M.D. On: 08/23/2023 08:34   [place] RT BREAST BX W LOC DEV 1ST LESION IMG BX SPEC US GUIDE Addendum Date: 08/28/2023 ADDENDUM REPORT: 08/28/2023 07:22 ADDENDUM: PATHOLOGY revealed: Site 1. Breast, right, needle core biopsy, 10 o'clock (deep) mass (ribbon): INVASIVE POORLY DIFFERENTIATED DUCTAL ADENOCARCINOMA WITH NECROSIS, GRADE 3. NEGATIVE FOR ANGIOLYMPHATIC INVASION. TUMOR MEASURES 5.5 MM IN GREATEST LINEAR EXTENT. Pathology results are CONCORDANT with imaging findings, per Dr. Edwin Cap. PATHOLOGY revealed: Site 2. Breast, right,  needle core biopsy, 10 o'clock (superficial) mass (heart clip) : HIGH-GRADE DUCTAL CARCINOMA IN SITU, SOLID TYPE WITH NECROSIS. NEGATIVE FOR DEFINITIVE INVASION. DCIS MEASURES 10 MM IN GREATEST LINEAR EXTENT. Pathology results are CONCORDANT with imaging findings, per Dr. Edwin Cap. PATHOLOGY revealed: Site 3. Breast, right, needle core biopsy, central outer calcs (x clip) : HIGH-GRADE DUCTAL CARCINOMA IN SITU, SOLID TYPE WITH NECROSIS. NEGATIVE FOR INVASION. DCIS MEASURES 4 MM IN GREATEST LINEAR EXTENT. Pathology results are CONCORDANT with imaging findings, per Dr. Edwin Cap. Pathology results and recommendations below were discussed with patient by telephone on 08/24/2023 by Rene Kocher RN. Patient reported biopsy site within normal limits with slight tenderness at the site. Post biopsy care instructions were reviewed, questions were answered and my direct phone number was provided to patient. Patient was instructed to call [facility] if any concerns or questions arise related to the biopsy. RECOMMENDATIONS: 1. Surgical and oncological consultation. Patient was referred to the [facility] at [facility] with appointment on 08/29/2023. 2. Please schedule patient for LEFT breast diagnostic mammogram as patient has not has imaging on this breast since 2024. Patient is scheduled for Monday 08/27/2023 and is aware. 3. Recommend pretreatment bilateral breast MRI with and without contrast to determine extent of breast disease given diagnosis of high grade DCIS. Pathology results reported by Randa Lynn RN on 08/24/2023. Electronically Signed   By: Edwin Cap M.D.   On: 08/28/2023 07:22   Result Date: 08/28/2023 CLINICAL DATA:  Patient presents for ultrasound guided core biopsy of 2 adjacent masses in the right breast at the 10 position. EXAM: ULTRASOUND GUIDED RIGHT BREAST CORE NEEDLE BIOPSY COMPARISON:  Previous exam(s).  PROCEDURE: I met with the patient and we discussed the procedure of ultrasound-guided biopsy, including benefits and alternatives. We discussed the high likelihood of a successful procedure. We discussed the risks of the procedure, including infection, bleeding, tissue injury, clip migration, and inadequate sampling. Informed written consent was given. The usual time-out protocol was performed immediately prior to the procedure. SITE 1: RIGHT BREAST 10 O'CLOCK (DEEP) MASS: Lesion quadrant: UPPER OUTER Using sterile technique and 1% Lidocaine as local anesthetic, under direct ultrasound visualization, a 14 gauge spring-loaded device was used to perform biopsy of the 0.9 cm in the right breast at 10 position using a lateral to medial approach. At the conclusion of the procedure a ribbon shaped tissue marker clip was deployed into the biopsy cavity. Follow up 2 view mammogram was performed and dictated separately. SITE 2: RIGHT BREAST 10 O'CLOCK (SUPERFICIAL) MASS: Lesion quadrant: UPPER OUTER Using sterile technique and 1% Lidocaine as local anesthetic, under direct ultrasound visualization, a 14 gauge spring-loaded device was used to perform biopsy of the 1.8 cm mass in the  right breast at the 10 position using a lateral 2 medial approach. At the conclusion of the procedure a heart shaped tissue marker clip was deployed into the biopsy cavity. Follow up 2 view mammogram was performed and dictated separately. IMPRESSION: 1. Ultrasound-guided core biopsy of the 0.9 cm mass right breast at 10 o'clock, at siteof ribbon shaped biopsy marking clip. 2. Ultrasound-guided core biopsy of the 1.8 cm mass in the right breast at 10 o'clock, at site of heart shaped biopsy marking clip. Electronically Signed: By: Edwin Cap M.D. On: 08/23/2023 08:34   [place] RT BREAST BX W LOC DEV EA ADD LESION IMG BX SPEC US GUIDE Addendum Date: 08/28/2023 ADDENDUM REPORT: 08/28/2023 07:22 ADDENDUM: PATHOLOGY revealed: Site 1. Breast, right,  needle core biopsy, 10 o'clock (deep) mass (ribbon): INVASIVE POORLY DIFFERENTIATED DUCTAL ADENOCARCINOMA WITH NECROSIS, GRADE 3. NEGATIVE FOR ANGIOLYMPHATIC INVASION. TUMOR MEASURES 5.5 MM IN GREATEST LINEAR EXTENT. Pathology results are CONCORDANT with imaging findings, per Dr. Edwin Cap. PATHOLOGY revealed: Site 2. Breast, right, needle core biopsy, 10 o'clock (superficial) mass (heart clip) : HIGH-GRADE DUCTAL CARCINOMA IN SITU, SOLID TYPE WITH NECROSIS. NEGATIVE FOR DEFINITIVE INVASION. DCIS MEASURES 10 MM IN GREATEST LINEAR EXTENT. Pathology results are CONCORDANT with imaging findings, per Dr. Edwin Cap. PATHOLOGY revealed: Site 3. Breast, right, needle core biopsy, central outer calcs (x clip) : HIGH-GRADE DUCTAL CARCINOMA IN SITU, SOLID TYPE WITH NECROSIS. NEGATIVE FOR INVASION. DCIS MEASURES 4 MM IN GREATEST LINEAR EXTENT. Pathology results are CONCORDANT with imaging findings, per Dr. Edwin Cap. Pathology results and recommendations below were discussed with patient by telephone on 08/24/2023 by Rene Kocher RN. Patient reported biopsy site within normal limits with slight tenderness at the site. Post biopsy care instructions were reviewed, questions were answered and my direct phone number was provided to patient. Patient was instructed to call [facility] if any concerns or questions arise related to the biopsy. RECOMMENDATIONS: 1. Surgical and oncological consultation. Patient was referred to the [facility] at [facility] with appointment on 08/29/2023. 2. Please schedule patient for LEFT breast diagnostic mammogram as patient has not has imaging on this breast since 2024. Patient is scheduled for Monday 08/27/2023 and is aware. 3. Recommend pretreatment bilateral breast MRI with and without contrast to determine extent of breast disease given diagnosis of high grade DCIS. Pathology results reported by Randa Lynn RN on 08/24/2023. Electronically Signed   By: Edwin Cap M.D.   On: 08/28/2023 07:22   Result Date: 08/28/2023 CLINICAL DATA:  Patient presents for ultrasound guided core biopsy of 2 adjacent masses in the right breast at the 10 position. EXAM: ULTRASOUND GUIDED RIGHT BREAST CORE NEEDLE BIOPSY COMPARISON:  Previous exam(s). PROCEDURE: I met with the patient and we discussed the procedure of ultrasound-guided biopsy, including benefits and alternatives. We discussed the high likelihood of a successful procedure. We discussed the risks of the procedure, including infection, bleeding, tissue injury, clip migration, and inadequate sampling. Informed written consent was given. The usual time-out protocol was performed immediately prior to the procedure. SITE 1: RIGHT BREAST 10 O'CLOCK (DEEP) MASS: Lesion quadrant: UPPER OUTER Using sterile technique and 1% Lidocaine as local anesthetic, under direct ultrasound visualization, a 14 gauge spring-loaded device was used to perform biopsy of the 0.9 cm in the right breast at 10 position using a lateral to medial approach. At the conclusion of the procedure a ribbon shaped tissue marker clip was deployed into the biopsy cavity.  Follow up 2 view mammogram was performed and dictated separately. SITE 2: RIGHT BREAST 10 O'CLOCK (SUPERFICIAL) MASS: Lesion quadrant: UPPER OUTER Using sterile technique and 1% Lidocaine as local anesthetic, under direct ultrasound visualization, a 14 gauge spring-loaded device was used to perform biopsy of the 1.8 cm mass in the right breast at the 10 position using a lateral 2 medial approach. At the conclusion of the procedure a heart shaped tissue marker clip was deployed into the biopsy cavity. Follow up 2 view mammogram was performed and dictated separately. IMPRESSION: 1. Ultrasound-guided core biopsy of the 0.9 cm mass right breast at 10 o'clock, at siteof ribbon shaped biopsy marking clip. 2. Ultrasound-guided core biopsy of the 1.8  cm mass in the right breast at 10 o'clock, at site of heart shaped biopsy marking clip. Electronically Signed: By: Edwin Cap M.D. On: 08/23/2023 08:34   MM 3D DIAGNOSTIC MAMMOGRAM UNILATERAL LEFT BREAST Result Date: 08/27/2023 CLINICAL DATA:  78 year old female with newly diagnosed RIGHT breast cancer. Patient presents for evaluation of the LEFT breast. EXAM: DIGITAL DIAGNOSTIC UNILATERAL LEFT MAMMOGRAM WITH TOMOSYNTHESIS AND CAD TECHNIQUE: Left digital diagnostic mammography and breast tomosynthesis was performed. The images were evaluated with computer-aided detection. COMPARISON:  Previous exam(s). ACR Breast Density Category b: There are scattered areas of fibroglandular density. FINDINGS: Full field views of the LEFT breast demonstrates no new or suspicious findings. No suspicious mass, distortion or worrisome calcifications are noted. IMPRESSION: No mammographic evidence of LEFT breast malignancy. RECOMMENDATION: Treatment plan for newly diagnosed RIGHT breast cancer. I have discussed the findings and recommendations with the patient. If applicable, a reminder letter will be sent to the patient regarding the next appointment. BI-RADS CATEGORY  1: Negative. Electronically Signed   By: Harmon Pier M.D.   On: 08/27/2023 10:54   MM CLIP PLACEMENT RIGHT Result Date: 08/23/2023 CLINICAL DATA:  Post ultrasound-guided core biopsy of a mass in the right breast at10 oc deep, ultrasound-guided core biopsy of a mass in the right breast 10 o'clock superficial, and stereotactic guided core biopsy of calcifications in the central outer right breast. EXAM: 3D DIAGNOSTIC RIGHT MAMMOGRAM POST ULTRASOUND BIOPSY COMPARISON:  Previous exam(s). ACR Breast Density Category b: There are scattered areas of fibroglandular density. FINDINGS: 3D Mammographic images were obtained following ultrasound-guided core biopsy of a mass in the right breast at10 oc deep, ultrasound-guided core biopsy of a mass in the right breast at 10  o'clock superficial, and stereotactic guided core biopsy of calcifications in the central outer right breast. A ribbon shaped biopsy marking clip is present at the site of the biopsied mass in the right breast at 10 o'clock deep (site 1). A heart shaped biopsy marking clip is present at the site of the biopsied mass in the right breast at 10 o'clock superficial (site 2). An x shaped biopsy marking clip is present at the site of the biopsied calcifications in the central outer right breast. IMPRESSION: 1. Ribbon shaped biopsy marking clip is present at the site of the biopsied mass in the right breast at 10 o'clock deep (site 1). 2. Heart shaped biopsy marking clip is present at the site of the biopsied mass in the right breast at 10 o'clock superficial (site 2). 3. X shaped biopsy marking clip is present at the site of the biopsied calcifications in the central outer right breast. Final Assessment: Post Procedure Mammograms for Marker Placement Electronically Signed   By: Edwin Cap M.D.   On: 08/23/2023 08:34   MM 3D  DIAGNOSTIC MAMMOGRAM UNILATERAL RIGHT BREAST Result Date: 08/14/2023 CLINICAL DATA:  Patient reports palpable abnormality in the right breast noted approximately 2-3 weeks ago. Most recent screening mammogram was performed April 2024 and was negative. EXAM: DIGITAL DIAGNOSTIC UNILATERAL RIGHT MAMMOGRAM WITH TOMOSYNTHESIS AND CAD; ULTRASOUND RIGHT BREAST LIMITED TECHNIQUE: Right digital diagnostic mammography and breast tomosynthesis was performed. The images were evaluated with computer-aided detection. ; Targeted ultrasound examination of the right breast was performed COMPARISON:  Previous exam(s). ACR Breast Density Category b: There are scattered areas of fibroglandular density. FINDINGS: A metallic BB applied to the skin denotes the patient indicated area of palpable abnormality in the upper outer right breast at middle depth. Underlying the skin marker is a 1.6 cm irregular mass with  associated fine linear and pleomorphic calcifications. Also noted in the upper outer right breast at middle depth is a oval mass with obscured margins measuring approximately 0.8 cm. Additionally, in the central outer breast at middle depth, there is a group punctate and amorphous calcifications spanning 0.3 cm. These findings cannot be confirmed as stable compared to prior exams. On physical exam, I appreciate a hard fixed mass in the upper outer right breast. Targeted ultrasound of the right breast at the 10 o'clock position 5 cm from the nipple, at the patient indicated area palpable abnormality, demonstrates a 1.8 x 0.8 x 1.5 cm irregular hypoechoic mass with multiple echogenic foci, corresponding to the calcified mass underlying the skin marker on mammogram. Adjacent to this mass at the 10 o'clock position 5 cm from the nipple is a 0.9 x 0.8 x 0.8 cm irregular hypoechoic mass with indistinct margins, corresponding to the obscured mass in the upper outer breast at middle depth. The distance between the 2 masses measures 0.8 cm. Targeted ultrasound the right axilla demonstrates lymph nodes with normal morphology. IMPRESSION: 1. Highly suspicious 1.8 cm palpable mass right breast 10 o'clock position. 2. Highly suspicious 0.9 cm mass noted adjacent to the dominant palpable mass at the 10 o'clock position. The distance between the 2 masses measures less than 1 cm. 3. Indeterminate grouped calcifications in the central outer right breast at posterior depth. 4. No right axillary lymphadenopathy. RECOMMENDATION: Ultrasound-guided biopsy of the palpable mass of the right breast 10 o'clock position and mammogram guided biopsy of the grouped calcifications in the central outer right breast. Biopsies are scheduled for 08/23/2023. With malignant result, if it would change clinical management, the probable satellite lesion at the right breast 10 o'clock position could also be sampled under ultrasound guidance. I have  discussed the findings and recommendations with the patient and her husband. The biopsy procedure was discussed with the patient and questions were answered. Patient expressed their understanding of the biopsy recommendation. BI-RADS CATEGORY  5: Highly suggestive of malignancy. Electronically Signed   By: Sherron Ales M.D.   On: 08/14/2023 13:05   [place] LIMITED ULTRASOUND INCLUDING AXILLA RIGHT BREAST Result Date: 08/14/2023 CLINICAL DATA:  Patient reports palpable abnormality in the right breast noted approximately 2-3 weeks ago. Most recent screening mammogram was performed April 2024 and was negative. EXAM: DIGITAL DIAGNOSTIC UNILATERAL RIGHT MAMMOGRAM WITH TOMOSYNTHESIS AND CAD; ULTRASOUND RIGHT BREAST LIMITED TECHNIQUE: Right digital diagnostic mammography and breast tomosynthesis was performed. The images were evaluated with computer-aided detection. ; Targeted ultrasound examination of the right breast was performed COMPARISON:  Previous exam(s). ACR Breast Density Category b: There are scattered areas of fibroglandular density. FINDINGS: A metallic BB applied to the skin denotes the patient indicated area of palpable  abnormality in the upper outer right breast at middle depth. Underlying the skin marker is a 1.6 cm irregular mass with associated fine linear and pleomorphic calcifications. Also noted in the upper outer right breast at middle depth is a oval mass with obscured margins measuring approximately 0.8 cm. Additionally, in the central outer breast at middle depth, there is a group punctate and amorphous calcifications spanning 0.3 cm. These findings cannot be confirmed as stable compared to prior exams. On physical exam, I appreciate a hard fixed mass in the upper outer right breast. Targeted ultrasound of the right breast at the 10 o'clock position 5 cm from the nipple, at the patient indicated area palpable abnormality, demonstrates a 1.8 x 0.8 x 1.5 cm irregular hypoechoic mass with multiple  echogenic foci, corresponding to the calcified mass underlying the skin marker on mammogram. Adjacent to this mass at the 10 o'clock position 5 cm from the nipple is a 0.9 x 0.8 x 0.8 cm irregular hypoechoic mass with indistinct margins, corresponding to the obscured mass in the upper outer breast at middle depth. The distance between the 2 masses measures 0.8 cm. Targeted ultrasound the right axilla demonstrates lymph nodes with normal morphology. IMPRESSION: 1. Highly suspicious 1.8 cm palpable mass right breast 10 o'clock position. 2. Highly suspicious 0.9 cm mass noted adjacent to the dominant palpable mass at the 10 o'clock position. The distance between the 2 masses measures less than 1 cm. 3. Indeterminate grouped calcifications in the central outer right breast at posterior depth. 4. No right axillary lymphadenopathy. RECOMMENDATION: Ultrasound-guided biopsy of the palpable mass of the right breast 10 o'clock position and mammogram guided biopsy of the grouped calcifications in the central outer right breast. Biopsies are scheduled for 08/23/2023. With malignant result, if it would change clinical management, the probable satellite lesion at the right breast 10 o'clock position could also be sampled under ultrasound guidance. I have discussed the findings and recommendations with the patient and her husband. The biopsy procedure was discussed with the patient and questions were answered. Patient expressed their understanding of the biopsy recommendation. BI-RADS CATEGORY  5: Highly suggestive of malignancy. Electronically Signed   By: Sherron Ales M.D.   On: 08/14/2023 13:05    All questions were answered. The patient knows to call the clinic with any problems, questions or concerns. I spent *** minutes in the care of this patient including H and P, review of records, counseling and coordination of care.     Rachel Moulds, MD 08/28/2023 6:21 PM

## 2023-08-29 ENCOUNTER — Inpatient Hospital Stay: Attending: Hematology and Oncology

## 2023-08-29 ENCOUNTER — Ambulatory Visit: Admitting: Physical Therapy

## 2023-08-29 ENCOUNTER — Inpatient Hospital Stay: Admitting: Licensed Clinical Social Worker

## 2023-08-29 ENCOUNTER — Inpatient Hospital Stay (HOSPITAL_BASED_OUTPATIENT_CLINIC_OR_DEPARTMENT_OTHER): Admitting: Genetic Counselor

## 2023-08-29 ENCOUNTER — Other Ambulatory Visit: Payer: Self-pay | Admitting: General Surgery

## 2023-08-29 ENCOUNTER — Encounter: Payer: Self-pay | Admitting: Genetic Counselor

## 2023-08-29 ENCOUNTER — Ambulatory Visit
Admission: RE | Admit: 2023-08-29 | Discharge: 2023-08-29 | Disposition: A | Discharge status: Home / Self Care | Source: Ambulatory Visit | Attending: Radiation Oncology | Admitting: Radiation Oncology

## 2023-08-29 ENCOUNTER — Encounter: Payer: Self-pay | Admitting: *Deleted

## 2023-08-29 ENCOUNTER — Inpatient Hospital Stay: Admitting: Hematology and Oncology

## 2023-08-29 VITALS — BP 135/58 | HR 73 | Resp 18 | Wt 117.6 lb

## 2023-08-29 DIAGNOSIS — Z171 Estrogen receptor negative status [ER-]: Secondary | ICD-10-CM

## 2023-08-29 DIAGNOSIS — Z1722 Progesterone receptor negative status: Secondary | ICD-10-CM

## 2023-08-29 DIAGNOSIS — Z803 Family history of malignant neoplasm of breast: Secondary | ICD-10-CM

## 2023-08-29 DIAGNOSIS — C50411 Malignant neoplasm of upper-outer quadrant of right female breast: Secondary | ICD-10-CM

## 2023-08-29 DIAGNOSIS — Z1732 Human epidermal growth factor receptor 2 negative status: Secondary | ICD-10-CM | POA: Insufficient documentation

## 2023-08-29 DIAGNOSIS — Z800 Family history of malignant neoplasm of digestive organs: Secondary | ICD-10-CM

## 2023-08-29 LAB — CMP (CANCER CENTER ONLY)
ALT: 13 U/L (ref 0–44)
AST: 18 U/L (ref 15–41)
Albumin: 4.3 g/dL (ref 3.5–5.0)
Alkaline Phosphatase: 69 U/L (ref 38–126)
Anion gap: 5 (ref 5–15)
BUN: 16 mg/dL (ref 8–23)
CO2: 29 mmol/L (ref 22–32)
Calcium: 8.9 mg/dL (ref 8.9–10.3)
Chloride: 105 mmol/L (ref 98–111)
Creatinine: 0.77 mg/dL (ref 0.44–1.00)
GFR, Estimated: >60 mL/min (ref >60)
Glucose, Bld: 94 mg/dL (ref 70–99)
Potassium: 4.4 mmol/L (ref 3.5–5.1)
Sodium: 139 mmol/L (ref 135–145)
Total Bilirubin: 0.3 mg/dL (ref 0.0–1.2)
Total Protein: 7 g/dL (ref 6.5–8.1)

## 2023-08-29 LAB — CBC WITH DIFFERENTIAL (CANCER CENTER ONLY)
Abs Immature Granulocytes: 0.01 10*3/uL (ref 0.00–0.07)
Basophils Absolute: 0 10*3/uL (ref 0.0–0.1)
Basophils Relative: 1 %
Eosinophils Absolute: 0.1 10*3/uL (ref 0.0–0.5)
Eosinophils Relative: 1 %
HCT: 38.5 % (ref 36.0–46.0)
Hemoglobin: 12.6 g/dL (ref 12.0–15.0)
Immature Granulocytes: 0 %
Lymphocytes Relative: 27 %
Lymphs Abs: 1.7 10*3/uL (ref 0.7–4.0)
MCH: 29.4 pg (ref 26.0–34.0)
MCHC: 32.7 g/dL (ref 30.0–36.0)
MCV: 89.7 fL (ref 80.0–100.0)
Monocytes Absolute: 0.5 10*3/uL (ref 0.1–1.0)
Monocytes Relative: 7 %
Neutro Abs: 4.1 10*3/uL (ref 1.7–7.7)
Neutrophils Relative %: 64 %
Platelet Count: 258 10*3/uL (ref 150–400)
RBC: 4.29 MIL/uL (ref 3.87–5.11)
RDW: 12.8 % (ref 11.5–15.5)
WBC Count: 6.3 10*3/uL (ref 4.0–10.5)
nRBC: 0 % (ref 0.0–0.2)

## 2023-08-29 LAB — GENETIC SCREENING ORDER

## 2023-08-29 NOTE — Assessment & Plan Note (Signed)
 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 Tumor measures 1.9 cm with adjacent 8 mm noninvasive tumor. Surgery followed by chemotherapy is planned due to high recurrence risk. - Perform lumpectomy to remove the tumor. - Administer chemotherapy post-surgery with a regimen of TC ( docetaxel and cyclophosphamide) every 21 days for four cycles. - Administer radiation therapy after chemotherapy. - Provide GCSF support to reduce infection risk. - Monitor for neuropathy and adjust chemotherapy dose if necessary. - Discussed cold cap therapy to reduce hair loss during chemotherapy. Pt interested in trying cold cap.  Osteoporosis Osteoporosis with previous hip fracture. Intolerance to calcium supplements, prefers dietary calcium and vitamin D3. - Review bone density results to assess osteoporosis status. - Consider alternative calcium supplements that do not cause nausea and bisphosphonates if dental clearance obtained.  Rachel Moulds MD

## 2023-08-29 NOTE — Progress Notes (Signed)
 REFERRING PROVIDER: Rachel Moulds, MD [address] [place],  [place] [address]  PRIMARY PROVIDER:  Lucianne Lei, MD  PRIMARY REASON FOR VISIT:  1. Malignant neoplasm of upper-outer quadrant of right breast in female, estrogen receptor negative (HCC)   2. Family history of breast cancer   3. Family history of colon cancer     HISTORY OF PRESENT ILLNESS:   Ms. Ford, a 78 y.o. female, was seen for a [place] cancer genetics consultation at the request of Dr. Al Pimple  due to a personal history of breast cabcer.  Ms. Kendrick presents to clinic today to discuss the possibility of a hereditary predisposition to cancer, to discuss genetic testing, and to further clarify her future cancer risks, as well as potential cancer risks for family members.   In March 2025, at the age of 53, Ms. Dorsainvil was diagnosed with invasive ductal carcinoma of the right breast (triple negative).  The treatment plan is pending.  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     CANCER HISTORY:  Oncology History  Malignant neoplasm of upper-outer quadrant of right breast in female, estrogen receptor negative (HCC)  08/28/2023 Initial Diagnosis   Malignant neoplasm of upper-outer quadrant of right breast in female, estrogen receptor negative (HCC)    Mammogram   Highly suspicious 1.8 cm palpable mass right breast 10 o'clock position. Highly suspicious 0.9 cm mass noted adjacent to the dominant palpable mass at the 10 o'clock position. The distance between the 2 masses measures less than 1 cm. Indeterminate grouped calcifications in the central outer right breast at posterior depth.    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   08/29/2023 Cancer Staging   Staging form: Breast, AJCC 8th Edition - Clinical stage from 08/29/2023: Stage IB (cT1c, cN0, cM0, G3, ER-, PR-, HER2-) - Signed by Rachel Moulds, MD on 08/29/2023 Stage prefix: Initial  diagnosis Histologic grading system: 3 grade system     Past Medical History:  Diagnosis Date   Age-related macular degeneration    Bunion of right foot 08/2017   Complication of anesthesia    hard to wake up from colonoscopy when Propofol not used   Dental crowns present    Metatarsalgia of right foot 08/2017   2nd toe   Migraines    with change of weather   TMJ syndrome     Past Surgical History:  Procedure Laterality Date   APPENDECTOMY  1960   BREAST BIOPSY Right 08/23/2023   [place] RT BREAST BX W LOC DEV 1ST LESION IMG BX SPEC US GUIDE 08/23/2023 [facility]   BREAST BIOPSY Right 08/23/2023   [place] RT BREAST BX W LOC DEV EA ADD LESION IMG BX SPEC US GUIDE 08/23/2023 [facility]   BREAST BIOPSY Right 08/23/2023   MM RT BREAST BX W LOC DEV 1ST LESION IMAGE BX SPEC STEREO GUIDE 08/23/2023 [facility]   BUNIONECTOMY WITH WEIL OSTEOTOMY Right 09/06/2017   Procedure: Right Lapidus, Modified McBride Bunionectomy;  Surgeon: Toni Arthurs, MD;  Location: [facility];  Service: Orthopedics;  Laterality: Right;   COLONOSCOPY WITH PROPOFOL  08/08/2016   HAMMERTOE RECONSTRUCTION WITH WEIL OSTEOTOMY  06/27/2018   Procedure: LEFT SECOND HAMMERTOE RECONSTRUCTION WITH WEIL OSTEOTOMY;  Surgeon: Toni Arthurs, MD;  Location: [facility];  Service: Orthopedics;;   TARSAL METATARSAL ARTHRODESIS Left 06/27/2018   Procedure: Left modified Adin Hector  and lapidus 1st tarsometatarsal arthrodesis;  Surgeon: Toni Arthurs, MD;  Location: [facility];  Service: Orthopedics;  Laterality: Left;    TONSILLECTOMY AND ADENOIDECTOMY  1954    FAMILY HISTORY:  We obtained a detailed, 4-generation family history.  Significant diagnoses are listed below: Family History  Problem Relation Age of Onset   Colon polyps Mother    Lung cancer Mother        d. 64   Colon polyps Father    Melanoma Father    Colon cancer Brother        and polyps; dx 29s   Lung cancer  Maternal Aunt    Lung cancer Maternal Grandfather    Breast cancer Niece        dx 33       Ms. Degrasse is unaware of previous family history of genetic testing for hereditary cancer risks. There is no reported Ashkenazi Jewish ancestry. There is no known consanguinity.  GENETIC COUNSELING ASSESSMENT: Ms. Verbeek is a 78 y.o. female with a personal history of breast cancer which is somewhat suggestive of a hereditary cancer syndrome given her triple negative status. We, therefore, discussed and recommended the following at today's visit.   DISCUSSION: We discussed that 5 - 10% of cancer is hereditary.  Most cases of hereditary breast cancer are associated with mutations in BRCA1/2.  There are other genes that can be associated with hereditary breast cancer syndromes.  We discussed that testing is beneficial for several reasons including knowing how to follow individuals for their cancer risks, identifying whether potential treatment options, such as PARP inhibitors,  would be beneficial, and understanding if other family members could be at an increased risk for cancer and allowing them to undergo genetic testing.   We reviewed the characteristics, features and inheritance patterns of hereditary cancer syndromes. We also discussed genetic testing, including the appropriate family members to test, the process of testing, insurance coverage and turn-around-time for results. We discussed the implications of a negative, positive, carrier and/or variant of uncertain significant result. We recommended Ms. Taormina pursue genetic testing for a panel that includes genes associated with breast cancer, colon cancer, and other cancers.   Ms. Diloreto  was offered a common hereditary cancer panel (~40 genes) and an expanded pan-cancer panel (~70 genes). Ms. Bonsall was informed of the benefits and limitations of each panel, including that expanded pan-cancer panels contain genes that do not have clear management  guidelines at this point in time.  We also discussed that as the number of genes included on a panel increases, the chances of variants of uncertain significance increases.  After considering the benefits and limitations of each gene panel, Ms. Apple  elected to have an expanded Psychologist, occupational through [employer]  The CancerNext-Expanded gene panel offered by [facility] and includes sequencing, rearrangement, and RNA analysis for the following 76 genes: AIP, ALK, APC, ATM, AXIN2, BAP1, BARD1, BMPR1A, BRCA1, BRCA2, BRIP1, CDC73, CDH1, CDK4, CDKN1B, CDKN2A, CEBPA, CHEK2, CTNNA1, DDX41, DICER1, ETV6, FH, FLCN, GATA2, LZTR1, MAX, MBD4, MEN1, MET, MLH1, MSH2, MSH3, MSH6, MUTYH, NF1, NF2, NTHL1, PALB2, PHOX2B, PMS2, POT1, PRKAR1A, PTCH1, PTEN, RAD51C, RAD51D, RB1, RET, RUNX1, SDHA, SDHAF2, SDHB, SDHC, SDHD, SMAD4, SMARCA4, SMARCB1, SMARCE1, STK11, SUFU, TMEM127, TP53, TSC1, TSC2, VHL, and WT1 (sequencing and deletion/duplication); EGFR, HOXB13, KIT, MITF, PDGFRA, POLD1, and POLE (sequencing only); EPCAM and GREM1 (deletion/duplication only).   Based on Ms. Kalbfleisch's personal history of triple negative breast cancer (in addition to a  niece dx with breast cancer at age 23), she meets NCCN medical criteria for genetic testing. Despite that she meets criteria, she may still have an out of pocket cost. We discussed that if her out of pocket cost for testing is over $100, the laboratory should contact her and discuss the self-pay prices and/or patient pay assistance programs.    PLAN: After considering the risks, benefits, and limitations, Ms. Benning provided informed consent to pursue genetic testing and the blood sample was sent to [facility] for analysis of the CancerNext-Expanded +RNAinsight Panel. Results should be available within approximately 3 weeks, at which point they will be disclosed by telephone to Ms. Brickey, as will any additional recommendations warranted by these results. Ms. Picinich  will receive a summary of her genetic counseling visit and a copy of her results once available. This information will also be available in Epic.   Ms. Campus's questions were answered to her satisfaction today. Our contact information was provided should additional questions or concerns arise. Thank you for the referral and allowing [place] to share in the care of your patient.   Ugonna Keirsey M. Rennie Plowman, MS, [facility] Genetic Counselor [email or web address] (P) [phone]   25 minutes were spent on the date of the encounter in service to the patient including preparation, face-to-face consultation, documentation and care coordination.  The patient was accompanied by her husband, daughter, granddaughter, and son in Social worker.  Dr. Al Pimple was available to discuss this case as needed.    _______________________________________________________________________ For Office Staff:  Number of people involved in session: 5 Was an Intern/ student involved with case: yes; Candace Teague Social worker) observed session

## 2023-08-29 NOTE — Research (Signed)
 Trial:  [employer] 2021-05 - Specimen Collection Study to Evaluate Biomarkers in Subjects with Cancer   Patient Heather Lewis was identified by Dr. Al Pimple as a potential candidate for the above listed study.  This Clinical Research Nurse met with Heather Lewis, [id number], on 08/29/23 in a manner and location that ensures patient privacy to discuss participation in the above listed research study.  Patient is Accompanied by her husband, daughter, son in law, and grand-daughter .  A copy of the informed consent document with embedded HIPAA language was provided to the patient.  Patient reads, speaks, and understands [place].   Patient was provided with the business card of this Nurse and encouraged to contact the research team with any questions.  Approximately 5 minutes were spent with the patient reviewing the informed consent documents.  Patient was provided the option of taking informed consent documents home to review and was encouraged to review at their convenience with their support network, including other care providers. Patient took the consent documents home to review. Patient agreed for research to call her on Monday 09/03/23 to follow up on the above study.  Domenica Reamer, BSN, RN, [employer] Research Nurse II [phone] 08/29/2023 11:56 AM

## 2023-08-29 NOTE — Progress Notes (Signed)
 [facility] Clinical Social Work  Initial Assessment   Heather Lewis is a 78 y.o. year old female accompanied by husband Leavy Cella), daughter Milas Gain), son-in-law, and granddaughter . Clinical Social Work was referred by  [facility]  for assessment of psychosocial needs.   SDOH (Social Determinants of Health) assessments performed: Yes SDOH Interventions    Flowsheet Row Clinical Support from 08/29/2023 in [facility] - A Dept Of [place]  SDOH Interventions   Food Insecurity Interventions Intervention Not Indicated  Housing Interventions Intervention Not Indicated  Transportation Interventions Intervention Not Indicated  Utilities Interventions Intervention Not Indicated       SDOH Screenings   Food Insecurity: No Food Insecurity (08/29/2023)  Housing: Low Risk  (08/29/2023)  Transportation Needs: No Transportation Needs (08/29/2023)  Utilities: Not At Risk (08/29/2023)  Depression (PHQ2-9): Low Risk  (08/29/2023)  Tobacco Use: Low Risk  (08/28/2023)   Received from [facility]     Distress Screen completed: No     No data to display            Family/Social Information:  Housing Arrangement: patient lives with spouse Leavy Cella) Family members/support persons in your life? Family and Friends Transportation concerns: no  Employment: Retired .  Income source: Actor concerns: No Type of concern: None Food access concerns: no Religious or spiritual practice: Patty Sermons is important to pt and helps in her coping Advanced directives:  pt thinks she may have AD- encouraged to check when she goes home Services Currently in place:  [facility] Medicare  Coping/ Adjustment to diagnosis: Patient understands treatment plan and what happens next? yes Patient reported stressors: Adjusting to my illness Patient enjoys time with family/ friends Current coping skills/ strengths: Ability for insight , Active sense of humor ,  Communication skills , Religious Affiliation , and Supportive family/friends     SUMMARY: Current SDOH Barriers:  No major barriers identified today  Clinical Social Work Clinical Goal(s):  No clinical social work goals at this time  Interventions: Discussed common feeling and emotions when being diagnosed with cancer, and the importance of support during treatment Informed patient of the support team roles and support services at [facility] Provided CSW contact information and encouraged patient to call with any questions or concerns   Follow Up Plan: Patient will contact CSW with any support or resource needs Patient verbalizes understanding of plan: Yes    Floyed Masoud E Lily Velasquez, LCSW Clinical Social Worker [facility]

## 2023-08-30 ENCOUNTER — Other Ambulatory Visit: Payer: Self-pay

## 2023-08-30 ENCOUNTER — Encounter: Payer: Self-pay | Admitting: Physical Therapy

## 2023-08-30 ENCOUNTER — Ambulatory Visit: Attending: Hematology and Oncology | Admitting: Physical Therapy

## 2023-08-30 DIAGNOSIS — C50411 Malignant neoplasm of upper-outer quadrant of right female breast: Secondary | ICD-10-CM | POA: Insufficient documentation

## 2023-08-30 DIAGNOSIS — R293 Abnormal posture: Secondary | ICD-10-CM | POA: Insufficient documentation

## 2023-08-30 DIAGNOSIS — Z171 Estrogen receptor negative status [ER-]: Secondary | ICD-10-CM | POA: Diagnosis present

## 2023-08-30 NOTE — Therapy (Signed)
 OUTPATIENT PHYSICAL THERAPY BREAST CANCER BASELINE EVALUATION   Patient Name: Heather Lewis MRN: [id number] DOB:[birth year], 78 y.o., female Today's Date: 08/30/2023  END OF SESSION:  PT End of Session - 08/30/23 1159     Visit Number 1    Number of Visits 2    Date for PT Re-Evaluation 10/25/23    PT Start Time 1105    PT Stop Time 1155    PT Time Calculation (min) 50 min    Activity Tolerance Patient tolerated treatment well    Behavior During Therapy [facility] for tasks assessed/performed             Past Medical History:  Diagnosis Date   Age-related macular degeneration    Bunion of right foot 08/2017   Complication of anesthesia    hard to wake up from colonoscopy when Propofol not used   Dental crowns present    Metatarsalgia of right foot 08/2017   2nd toe   Migraines    with change of weather   TMJ syndrome    Past Surgical History:  Procedure Laterality Date   APPENDECTOMY  1960   BREAST BIOPSY Right 08/23/2023   [place] RT BREAST BX W LOC DEV 1ST LESION IMG BX SPEC US GUIDE 08/23/2023 [facility]   BREAST BIOPSY Right 08/23/2023   [place] RT BREAST BX W LOC DEV EA ADD LESION IMG BX SPEC US GUIDE 08/23/2023 [facility]   BREAST BIOPSY Right 08/23/2023   MM RT BREAST BX W LOC DEV 1ST LESION IMAGE BX SPEC STEREO GUIDE 08/23/2023 [facility]   BUNIONECTOMY WITH WEIL OSTEOTOMY Right 09/06/2017   Procedure: Right Lapidus, Modified Orpha Bur;  Surgeon: Toni Arthurs, MD;  Location: [facility];  Service: Orthopedics;  Laterality: Right;   COLONOSCOPY WITH PROPOFOL  08/08/2016   HAMMERTOE RECONSTRUCTION WITH WEIL OSTEOTOMY  06/27/2018   Procedure: LEFT SECOND HAMMERTOE RECONSTRUCTION WITH WEIL OSTEOTOMY;  Surgeon: Toni Arthurs, MD;  Location: [facility];  Service: Orthopedics;;   TARSAL METATARSAL ARTHRODESIS Left 06/27/2018   Procedure: Left modified McBride and lapidus 1st tarsometatarsal arthrodesis;  Surgeon: Toni Arthurs, MD;   Location: [facility];  Service: Orthopedics;  Laterality: Left;    TONSILLECTOMY AND ADENOIDECTOMY  1954   Patient Active Problem List   Diagnosis Date Noted   Malignant neoplasm of upper-outer quadrant of right breast in female, estrogen receptor negative (HCC) 08/28/2023    REFERRING PROVIDER: Dr. Rachel Moulds  REFERRING DIAG: Right breast cancer  THERAPY DIAG:  Malignant neoplasm of upper-outer quadrant of right breast in female, estrogen receptor negative (HCC)  Abnormal posture  Rationale for Evaluation and Treatment: Rehabilitation  ONSET DATE: 08/23/2023  SUBJECTIVE:  SUBJECTIVE STATEMENT: Patient reports she is here today to be seen by her medical team for her newly diagnosed right breast cancer.   PERTINENT HISTORY:  Patient was diagnosed on 08/23/2023 with right grade 3 invasive ductal carcinoma breast cancer. It measures 1.9 cm and is located in the upper outer quadrant. It is triple negative with a Ki67 of 30%. She has a history of a left femur fracture with surgery 02/03/2023.  PATIENT GOALS:   reduce lymphedema risk and learn post op HEP.   PAIN:  Are you having pain? No  PRECAUTIONS: Active CA  RED FLAGS: None   HAND DOMINANCE: right  WEIGHT BEARING RESTRICTIONS: No  FALLS:  Has patient fallen in last 6 months? No  LIVING ENVIRONMENT: Patient lives with: her husband Lives in: House/apartment Has following equipment at home: None  OCCUPATION: Retired  LEISURE: She walks daily for a mile in her house and does a HEP for her left hip  PRIOR LEVEL OF FUNCTION: Independent   OBJECTIVE: Note: Objective measures were completed at Evaluation unless otherwise noted.  COGNITION: Overall cognitive status: Within functional limits for tasks  assessed    POSTURE:  Forward head and rounded shoulders posture  UPPER EXTREMITY AROM/PROM:  A/PROM RIGHT   eval   Shoulder extension 45  Shoulder flexion 136  Shoulder abduction 159  Shoulder internal rotation 63  Shoulder external rotation 72    (Blank rows = not tested)  A/PROM LEFT   eval  Shoulder extension 38  Shoulder flexion 132  Shoulder abduction 153  Shoulder internal rotation 57  Shoulder external rotation 80    (Blank rows = not tested)  CERVICAL AROM: All within normal limits  UPPER EXTREMITY STRENGTH: WNL  LYMPHEDEMA ASSESSMENTS (in cm):   LANDMARK RIGHT   eval  10 cm proximal to olecranon process 24  Olecranon process 21.2  10 cm proximal to ulnar styloid process 18.8  Just proximal to ulnar styloid process 13.5  Across hand at thumb web space 17.5  At base of 2nd digit 6  (Blank rows = not tested)  LANDMARK LEFT   eval  10 cm proximal to olecranon process 24.1  Olecranon process 21.5  10 cm proximal to ulnar styloid process 17.7  Just proximal to ulnar styloid process 13  Across hand at thumb web space 17.8  At base of 2nd digit 5.7  (Blank rows = not tested)  L-DEX LYMPHEDEMA SCREENING:  The patient was assessed using the L-Dex machine today to produce a lymphedema index baseline score. The patient will be reassessed on a regular basis (typically every 3 months) to obtain new L-Dex scores. If the score is > 6.5 points away from his/her baseline score indicating onset of subclinical lymphedema, it will be recommended to wear a compression garment for 4 weeks, 12 hours per day and then be reassessed. If the score continues to be > 6.5 points from baseline at reassessment, we will initiate lymphedema treatment. Assessing in this manner has a 95% rate of preventing clinically significant lymphedema.   L-DEX FLOWSHEETS - 08/30/23 1100       L-DEX LYMPHEDEMA SCREENING   Measurement Type Unilateral    L-DEX MEASUREMENT EXTREMITY Upper  Extremity    POSITION  Standing    DOMINANT SIDE Right    At Risk Side Right    BASELINE SCORE (UNILATERAL) 0.4             QUICK DASH SURVEY:  Neldon Mc - 08/30/23 0001  Open a tight or new jar No difficulty    Do heavy household chores (wash walls, wash floors) No difficulty    Carry a shopping bag or briefcase No difficulty    Wash your back No difficulty    Use a knife to cut food No difficulty    Recreational activities in which you take some force or impact through your arm, shoulder, or hand (golf, hammering, tennis) No difficulty    During the past week, to what extent has your arm, shoulder or hand problem interfered with your normal social activities with family, friends, neighbors, or groups? Not at all    During the past week, to what extent has your arm, shoulder or hand problem limited your work or other regular daily activities Not at all    Arm, shoulder, or hand pain. None    Tingling (pins and needles) in your arm, shoulder, or hand None    Difficulty Sleeping No difficulty    DASH Score 0 %              PATIENT EDUCATION:  Education details: Time spent educating patient on aspects of self-care to maximize post op recovery. Patient was educated on where and how to get a post op compression bra to use to reduce post op edema. Patient was also educated on the use of SOZO screenings and surveillance principles for early identification of lymphedema onset. She was instructed to use the post op pillow in the axilla for pressure and pain relief. Patient educated on lymphedema risk reduction and post op shoulder/posture HEP. Person educated: Patient Education method: Explanation, Demonstration, Handout Education comprehension: Patient verbalized understanding and returned demonstration  HOME EXERCISE PROGRAM: Patient was instructed today in a home exercise program today for post op shoulder range of motion. These included active assist shoulder flexion in  sitting, scapular retraction, wall walking with shoulder abduction, and hands behind head external rotation.  She was encouraged to do these twice a day, holding 3 seconds and repeating 5 times when permitted by her physician.   ASSESSMENT:  CLINICAL IMPRESSION: Patient was diagnosed on 08/23/2023 with right grade 3 invasive ductal carcinoma breast cancer. It measures 1.9 cm and is located in the upper outer quadrant. It is triple negative with a Ki67 of 30%.Her multidisciplinary medical team met prior to her assessments to determine a recommended treatment plan. She is planning to have a right lumpectomy and sentinel node biopsy followed by chemotherapy and radiation. She will benefit from a post op PT reassessment to determine needs and from L-Dex screens every 3 months for 2 years to detect subclinical lymphedema.  Pt will benefit from skilled therapeutic intervention to improve on the following deficits: Decreased knowledge of precautions, impaired UE functional use, pain, decreased ROM, postural dysfunction.   PT treatment/interventions: ADL/self-care home management, pt/family education, therapeutic exercise  REHAB POTENTIAL: Excellent  CLINICAL DECISION MAKING: Stable/uncomplicated  EVALUATION COMPLEXITY: Low   GOALS: Goals reviewed with patient? YES  LONG TERM GOALS: (STG=LTG)    Name Target Date Goal status  1 Pt will be able to verbalize understanding of pertinent lymphedema risk reduction practices relevant to her dx specifically related to skin care.  Baseline:  No knowledge 08/30/2023 Achieved at eval  2 Pt will be able to return demo and/or verbalize understanding of the post op HEP related to regaining shoulder ROM. Baseline:  No knowledge 08/30/2023 Achieved at eval  3 Pt will be able to verbalize understanding of the importance of viewing the  post op After Breast CA Class video for further lymphedema risk reduction education and therapeutic exercise.  Baseline:  No  knowledge 08/30/2023 Achieved at eval  4 Pt will demo she has regained full shoulder ROM and function post operatively compared to baselines.  Baseline: See objective measurements taken today. 10/25/2023     PLAN:  PT FREQUENCY/DURATION: EVAL and 1 follow up appointment.   PLAN FOR NEXT SESSION: will reassess 3-4 weeks post op to determine needs.   Patient will follow up at outpatient cancer rehab 3-4 weeks following surgery.  If the patient requires physical therapy at that time, a specific plan will be dictated and sent to the referring physician for approval. The patient was educated today on appropriate basic range of motion exercises to begin post operatively and the importance of viewing the After Breast Cancer class video following surgery.  Patient was educated today on lymphedema risk reduction practices as it pertains to recommendations that will benefit the patient immediately following surgery.  She verbalized good understanding.    Physical Therapy Information for After Breast Cancer Surgery/Treatment:  Lymphedema is a swelling condition that you may be at risk for in your arm if you have lymph nodes removed from the armpit area.  After a sentinel node biopsy, the risk is approximately 5-9% and is higher after an axillary node dissection.  There is treatment available for this condition and it is not life-threatening.  Contact your physician or physical therapist with concerns. You may begin the 4 shoulder/posture exercises (see additional sheet) when permitted by your physician (typically a week after surgery).  If you have drains, you may need to wait until those are removed before beginning range of motion exercises.  A general recommendation is to not lift your arms above shoulder height until drains are removed.  These exercises should be done to your tolerance and gently.  This is not a "no pain/no gain" type of recovery so listen to your body and stretch into the range of motion  that you can tolerate, stopping if you have pain.  If you are having immediate reconstruction, ask your plastic surgeon about doing exercises as he or she may want you to wait. We encourage you to attend the free one time ABC (After Breast Cancer) class offered by [facility].  You will learn information related to lymphedema risk, prevention and treatment and additional exercises to regain mobility following surgery.  You can call [phone] for more information.  This is offered the 1st and 3rd Monday of each month.  You only attend the class one time. While undergoing any medical procedure or treatment, try to avoid blood pressure being taken or needle sticks from occurring on the arm on the side of cancer.   This recommendation begins after surgery and continues for the rest of your life.  This may help reduce your risk of getting lymphedema (swelling in your arm). An excellent resource for those seeking information on lymphedema is the National Lymphedema Network's web site. It can be accessed at www.lymphnet.org If you notice swelling in your hand, arm or breast at any time following surgery (even if it is many years from now), please contact your doctor or physical therapist to discuss this.  Lymphedema can be treated at any time but it is easier for you if it is treated early on.  If you feel like your shoulder motion is not returning to normal in a reasonable amount of time, please contact your  surgeon or physical therapist.  [facility] [phone]. [address], [address], [place] [address]  ABC CLASS After Breast Cancer Class  After Breast Cancer Class is a specially designed exercise class video to assist you in a safe recover after having breast cancer surgery.  In this video you will learn how to get back to full function whether your drains were just removed or if you had surgery a month ago. The video can be viewed on this  page: https://www.boyd-meyer.org/ or on YouTube here: https://[email or web address].  Class Goals  Understand specific stretches to improve the flexibility of you chest and shoulder. Learn ways to safely strengthen your upper body and improve your posture. Understand the warning signs of infection and why you may be at risk for an arm infection. Learn about Lymphedema and prevention.  ** You do not need to view this video until after surgery.  Drains should be removed to participate in the recommended exercises on the video.  Patient was instructed today in a home exercise program today for post op shoulder range of motion. These included active assist shoulder flexion in sitting, scapular retraction, wall walking with shoulder abduction, and hands behind head external rotation.  She was encouraged to do these twice a day, holding 3 seconds and repeating 5 times when permitted by her physician.   Bethann Punches, [place] 08/30/23 12:25 PM

## 2023-08-31 ENCOUNTER — Other Ambulatory Visit: Payer: Self-pay | Admitting: General Surgery

## 2023-08-31 DIAGNOSIS — C50411 Malignant neoplasm of upper-outer quadrant of right female breast: Secondary | ICD-10-CM

## 2023-09-03 ENCOUNTER — Telehealth: Payer: Self-pay | Admitting: *Deleted

## 2023-09-03 ENCOUNTER — Telehealth: Payer: Self-pay | Admitting: Hematology and Oncology

## 2023-09-03 NOTE — Telephone Encounter (Signed)
 [employer] 2021-05 - Specimen Collection Study to Evaluate Biomarkers in Subjects with Cancer   Called patient to follow up on the above study and see if she has any questions.  Patient states she has not had a chance to read the consent form due to a death in the family. Patient states she is interested in reviewing the study and understands she would need to make an appointment to meet with research staff and have blood drawn prior to her surgery scheduled for 3/27, if she is interested in participating. Gave patient my phone number and she states she will call back if she has questions or wants to make an appointment. She prefers for research to not contact her since she is very busy this week.  Thanked patient for her time and consideration of this study.  Domenica Reamer, BSN, RN, [employer] Research Nurse II [phone] 09/03/2023 1:13 PM

## 2023-09-05 ENCOUNTER — Telehealth: Payer: Self-pay | Admitting: *Deleted

## 2023-09-05 ENCOUNTER — Encounter: Payer: Self-pay | Admitting: *Deleted

## 2023-09-05 NOTE — Telephone Encounter (Signed)
 Spoke with patient to follow up from [facility] 3/12 and assess navigation needs. Patient denies any questions or concerns at this time.  She would like to do her chemo education same day as Dr. Remonia Richter appt on 4/10. She sees Dr. Al Pimple at 11am and would like to do chemo education at 2pm. Schedule message sent. Encouraged her to call should anything else arise.

## 2023-09-06 ENCOUNTER — Encounter (HOSPITAL_BASED_OUTPATIENT_CLINIC_OR_DEPARTMENT_OTHER): Payer: Self-pay | Admitting: General Surgery

## 2023-09-06 ENCOUNTER — Other Ambulatory Visit: Payer: Self-pay

## 2023-09-11 ENCOUNTER — Encounter: Payer: Self-pay | Admitting: *Deleted

## 2023-09-11 ENCOUNTER — Ambulatory Visit
Admission: RE | Admit: 2023-09-11 | Discharge: 2023-09-11 | Disposition: A | Discharge status: Home / Self Care | Source: Ambulatory Visit | Attending: General Surgery | Admitting: General Surgery

## 2023-09-11 DIAGNOSIS — C50411 Malignant neoplasm of upper-outer quadrant of right female breast: Secondary | ICD-10-CM

## 2023-09-11 HISTORY — PX: BREAST BIOPSY: SHX20

## 2023-09-11 MED ORDER — CHLORHEXIDINE GLUCONATE CLOTH 2 % EX PADS
6.0000 | MEDICATED_PAD | Freq: Once | CUTANEOUS | Status: DC
Start: 2023-09-11 — End: 2023-09-13

## 2023-09-11 MED ORDER — CHLORHEXIDINE GLUCONATE CLOTH 2 % EX PADS: 6.0000 | MEDICATED_PAD | Freq: Once | CUTANEOUS | Status: DC

## 2023-09-11 MED ORDER — ENSURE PRE-SURGERY PO LIQD
296.0000 mL | Freq: Once | ORAL | Status: DC
Start: 2023-09-12 — End: 2023-09-13

## 2023-09-11 NOTE — Progress Notes (Signed)

## 2023-09-12 NOTE — H&P (View-Only) (Signed)
 77yof with right breast mass noted on exam. She has b density breasts. She has an outer right breast 3 mm calcs, mass at 10 o clock that measures 1.8x1.5x0.8 cm with an adjacent 9x8x8 mm mass. Ax [place] is negative. All 3 biopsies positive with farthest clips being 4 cm away. 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 She is here to discuss options  Review of Systems: A complete review of systems was obtained from the patient. I have reviewed this information and discussed as appropriate with the patient. See HPI as well for other ROS.  Review of Systems  All other systems reviewed and are negative.  Medical History: Past Medical History:  Diagnosis Date  Anxiety  Arthritis  History of cancer   Patient Active Problem List  Diagnosis  Malignant neoplasm of upper-outer quadrant of right breast in female, estrogen receptor negative (CMS/HHS-HCC)   Past Surgical History:  Procedure Laterality Date  Tarsal metatarsal arthrodesis Left 06/27/2018  .Right Breast Biopsy Right 08/23/2023  APPENDECTOMY   Allergies  Allergen Reactions  Nitrate Analogues Other (See Comments)  MIGRAINES  Oxycodone Hallucination   Current Outpatient Medications on File Prior to Visit  Medication Sig Dispense Refill  cholecalciferol (VITAMIN D3) 1000 unit tablet Take 1,000 Units by mouth once daily  docusate (COLACE) 100 MG capsule Take 100 mg by mouth once daily  ibuprofen (MOTRIN) 800 MG tablet Take 800 mg by mouth every 8 (eight) hours as needed for Pain   Family History  Problem Relation Age of Onset  Lung cancer Mother  Skin cancer Father  Colon cancer Brother    Social History   Tobacco Use  Smoking Status Never  Smokeless Tobacco Never  Marital status: Married  Tobacco Use  Smoking status: Never  Smokeless tobacco: Never  Vaping Use  Vaping status: Never Used  Substance and Sexual Activity  Alcohol use: Not Currently  Drug use: Never   Objective:    Physical Exam Vitals reviewed.  Constitutional:  Appearance: Normal appearance.  Chest:  Breasts: Right: No inverted nipple, mass or nipple discharge.  Left: No inverted nipple, mass or nipple discharge.  Lymphadenopathy:  Upper Body:  Right upper body: No supraclavicular or axillary adenopathy.  Left upper body: No supraclavicular or axillary adenopathy.  Neurological:  Mental Status: She is alert.    Assessment and Plan:   Malignant neoplasm of upper-outer quadrant of right breast in female, estrogen receptor negative (CMS/HHS-HCC)  Right breast seed bracketed lumpectomy, right ax sn biopsy, port placement  We discussed the staging and pathophysiology of breast cancer. We discussed all of the different options for treatment for breast cancer including surgery, chemotherapy, radiation therapy, and antiestrogen therapy.  We discussed a sentinel lymph node biopsy as she does not appear to having lymph node involvement right now. We discussed the performance of that with injection of Magtrace and small risk of discoloration. We discussed that there is a chance of having a positive node with a sentinel lymph node biopsy and we will await the permanent pathology to make any other first further decisions in terms of her treatment. We discussed up to a 5% risk lifetime of chronic shoulder pain as well as lymphedema associated with a sentinel lymph node biopsy.  We discussed a port placement as well today as she has been recommended chemotherapy in adjuvant setting.   We discussed the options for treatment of the breast cancer which included lumpectomy versus a mastectomy.  We discussed the performance of the lumpectomy with radioactive seed placement. We discussed a 5-10% chance of a positive margin requiring reexcision in the operating room. We also discussed that she will likely need radiation therapy if she undergoes lumpectomy. We discussed mastectomy and the postoperative care for that  as well. Mastectomy can be followed by reconstruction. The decision for lumpectomy vs mastectomy has no impact on decision for chemotherapy. Most mastectomy patients will not need radiation therapy. We discussed that there is no difference in her survival whether she undergoes lumpectomy with radiation therapy or antiestrogen therapy versus a mastectomy. There is also no real difference between her recurrence in the breast.  We discussed the risks of operation including bleeding, infection, possible reoperation. She understands her further therapy will be based on what her stages at the time of her operation.

## 2023-09-12 NOTE — H&P (Signed)
 77yof with right breast mass noted on exam. She has b density breasts. She has an outer right breast 3 mm calcs, mass at 10 o clock that measures 1.8x1.5x0.8 cm with an adjacent 9x8x8 mm mass. Ax [place] is negative. All 3 biopsies positive with farthest clips being 4 cm away. 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 She is here to discuss options  Review of Systems: A complete review of systems was obtained from the patient. I have reviewed this information and discussed as appropriate with the patient. See HPI as well for other ROS.  Review of Systems  All other systems reviewed and are negative.  Medical History: Past Medical History:  Diagnosis Date  Anxiety  Arthritis  History of cancer   Patient Active Problem List  Diagnosis  Malignant neoplasm of upper-outer quadrant of right breast in female, estrogen receptor negative (CMS/HHS-HCC)   Past Surgical History:  Procedure Laterality Date  Tarsal metatarsal arthrodesis Left 06/27/2018  .Right Breast Biopsy Right 08/23/2023  APPENDECTOMY   Allergies  Allergen Reactions  Nitrate Analogues Other (See Comments)  MIGRAINES  Oxycodone Hallucination   Current Outpatient Medications on File Prior to Visit  Medication Sig Dispense Refill  cholecalciferol (VITAMIN D3) 1000 unit tablet Take 1,000 Units by mouth once daily  docusate (COLACE) 100 MG capsule Take 100 mg by mouth once daily  ibuprofen (MOTRIN) 800 MG tablet Take 800 mg by mouth every 8 (eight) hours as needed for Pain   Family History  Problem Relation Age of Onset  Lung cancer Mother  Skin cancer Father  Colon cancer Brother    Social History   Tobacco Use  Smoking Status Never  Smokeless Tobacco Never  Marital status: Married  Tobacco Use  Smoking status: Never  Smokeless tobacco: Never  Vaping Use  Vaping status: Never Used  Substance and Sexual Activity  Alcohol use: Not Currently  Drug use: Never   Objective:    Physical Exam Vitals reviewed.  Constitutional:  Appearance: Normal appearance.  Chest:  Breasts: Right: No inverted nipple, mass or nipple discharge.  Left: No inverted nipple, mass or nipple discharge.  Lymphadenopathy:  Upper Body:  Right upper body: No supraclavicular or axillary adenopathy.  Left upper body: No supraclavicular or axillary adenopathy.  Neurological:  Mental Status: She is alert.    Assessment and Plan:   Malignant neoplasm of upper-outer quadrant of right breast in female, estrogen receptor negative (CMS/HHS-HCC)  Right breast seed bracketed lumpectomy, right ax sn biopsy, port placement  We discussed the staging and pathophysiology of breast cancer. We discussed all of the different options for treatment for breast cancer including surgery, chemotherapy, radiation therapy, and antiestrogen therapy.  We discussed a sentinel lymph node biopsy as she does not appear to having lymph node involvement right now. We discussed the performance of that with injection of Magtrace and small risk of discoloration. We discussed that there is a chance of having a positive node with a sentinel lymph node biopsy and we will await the permanent pathology to make any other first further decisions in terms of her treatment. We discussed up to a 5% risk lifetime of chronic shoulder pain as well as lymphedema associated with a sentinel lymph node biopsy.  We discussed a port placement as well today as she has been recommended chemotherapy in adjuvant setting.   We discussed the options for treatment of the breast cancer which included lumpectomy versus a mastectomy.  We discussed the performance of the lumpectomy with radioactive seed placement. We discussed a 5-10% chance of a positive margin requiring reexcision in the operating room. We also discussed that she will likely need radiation therapy if she undergoes lumpectomy. We discussed mastectomy and the postoperative care for that  as well. Mastectomy can be followed by reconstruction. The decision for lumpectomy vs mastectomy has no impact on decision for chemotherapy. Most mastectomy patients will not need radiation therapy. We discussed that there is no difference in her survival whether she undergoes lumpectomy with radiation therapy or antiestrogen therapy versus a mastectomy. There is also no real difference between her recurrence in the breast.  We discussed the risks of operation including bleeding, infection, possible reoperation. She understands her further therapy will be based on what her stages at the time of her operation.

## 2023-09-13 ENCOUNTER — Ambulatory Visit (HOSPITAL_COMMUNITY)

## 2023-09-13 ENCOUNTER — Other Ambulatory Visit: Payer: Self-pay

## 2023-09-13 ENCOUNTER — Ambulatory Visit (HOSPITAL_BASED_OUTPATIENT_CLINIC_OR_DEPARTMENT_OTHER): Admitting: Anesthesiology

## 2023-09-13 ENCOUNTER — Ambulatory Visit
Admission: RE | Admit: 2023-09-13 | Discharge: 2023-09-13 | Disposition: A | Discharge status: Home / Self Care | Source: Ambulatory Visit | Attending: General Surgery | Admitting: General Surgery

## 2023-09-13 ENCOUNTER — Encounter (HOSPITAL_BASED_OUTPATIENT_CLINIC_OR_DEPARTMENT_OTHER): Payer: Self-pay | Admitting: General Surgery

## 2023-09-13 ENCOUNTER — Encounter (HOSPITAL_BASED_OUTPATIENT_CLINIC_OR_DEPARTMENT_OTHER)
Admission: RE | Disposition: A | Discharge status: Home / Self Care | Payer: Self-pay | Source: Home / Self Care | Attending: General Surgery

## 2023-09-13 ENCOUNTER — Ambulatory Visit (HOSPITAL_BASED_OUTPATIENT_CLINIC_OR_DEPARTMENT_OTHER)
Admission: RE | Admit: 2023-09-13 | Discharge: 2023-09-13 | Disposition: A | Discharge status: Home / Self Care | Attending: General Surgery | Admitting: General Surgery

## 2023-09-13 DIAGNOSIS — Z171 Estrogen receptor negative status [ER-]: Secondary | ICD-10-CM

## 2023-09-13 DIAGNOSIS — C50411 Malignant neoplasm of upper-outer quadrant of right female breast: Secondary | ICD-10-CM

## 2023-09-13 DIAGNOSIS — Z1722 Progesterone receptor negative status: Secondary | ICD-10-CM | POA: Insufficient documentation

## 2023-09-13 HISTORY — PX: BREAST LUMPECTOMY WITH RADIOACTIVE SEED LOCALIZATION: SHX6424

## 2023-09-13 HISTORY — PX: PORTACATH PLACEMENT: SHX2246

## 2023-09-13 HISTORY — PX: AXILLARY SENTINEL NODE BIOPSY: SHX5738

## 2023-09-13 SURGERY — BREAST LUMPECTOMY WITH RADIOACTIVE SEED LOCALIZATION
Anesthesia: Regional | Site: Chest | Laterality: Right

## 2023-09-13 MED ORDER — LACTATED RINGERS IV SOLN: INTRAVENOUS | Status: DC

## 2023-09-13 MED ORDER — PROPOFOL 10 MG/ML IV BOLUS
INTRAVENOUS | Status: DC | PRN
  Administered 2023-09-13: 100 mg via INTRAVENOUS

## 2023-09-13 MED ORDER — CEFAZOLIN SODIUM-DEXTROSE 2-4 GM/100ML-% IV SOLN
2.0000 g | INTRAVENOUS | Status: AC
Start: 2023-09-13 — End: 2023-09-13
  Administered 2023-09-13: 2 g via INTRAVENOUS

## 2023-09-13 MED ORDER — EPHEDRINE SULFATE (PRESSORS) 50 MG/ML IJ SOLN
INTRAMUSCULAR | Status: DC | PRN
  Administered 2023-09-13 (×6): 5 mg via INTRAVENOUS

## 2023-09-13 MED ORDER — MAGTRACE LYMPHATIC TRACER
INTRAMUSCULAR | Status: DC | PRN
  Administered 2023-09-13: 2 mL via INTRAMUSCULAR

## 2023-09-13 MED ORDER — HEPARIN SOD (PORK) LOCK FLUSH 100 UNIT/ML IV SOLN
INTRAVENOUS | Status: DC | PRN
  Administered 2023-09-13: 400 [IU]

## 2023-09-13 MED ORDER — ACETAMINOPHEN 500 MG PO TABS
ORAL_TABLET | ORAL | Status: AC
  Filled 2023-09-13: qty 2

## 2023-09-13 MED ORDER — FENTANYL CITRATE (PF) 100 MCG/2ML IJ SOLN
INTRAMUSCULAR | Status: AC
  Filled 2023-09-13: qty 2

## 2023-09-13 MED ORDER — FENTANYL CITRATE (PF) 100 MCG/2ML IJ SOLN: 25.0000 ug | INTRAMUSCULAR | Status: DC | PRN

## 2023-09-13 MED ORDER — DEXAMETHASONE SODIUM PHOSPHATE 10 MG/ML IJ SOLN
INTRAMUSCULAR | Status: DC | PRN
  Administered 2023-09-13: 10 mg

## 2023-09-13 MED ORDER — MIDAZOLAM HCL 2 MG/2ML IJ SOLN
INTRAMUSCULAR | Status: AC
  Filled 2023-09-13: qty 2

## 2023-09-13 MED ORDER — ACETAMINOPHEN 500 MG PO TABS
1000.0000 mg | ORAL_TABLET | ORAL | Status: AC
  Administered 2023-09-13: 1000 mg via ORAL

## 2023-09-13 MED ORDER — ROPIVACAINE HCL 5 MG/ML IJ SOLN
INTRAMUSCULAR | Status: DC | PRN
  Administered 2023-09-13: 30 mL via PERINEURAL

## 2023-09-13 MED ORDER — CEFAZOLIN SODIUM-DEXTROSE 2-4 GM/100ML-% IV SOLN
INTRAVENOUS | Status: AC
  Filled 2023-09-13: qty 100

## 2023-09-13 MED ORDER — ONDANSETRON HCL 4 MG/2ML IJ SOLN
INTRAMUSCULAR | Status: DC | PRN
  Administered 2023-09-13: 4 mg via INTRAVENOUS

## 2023-09-13 MED ORDER — DEXAMETHASONE SODIUM PHOSPHATE 4 MG/ML IJ SOLN
INTRAMUSCULAR | Status: DC | PRN
  Administered 2023-09-13: 5 mg via INTRAVENOUS

## 2023-09-13 MED ORDER — TRAMADOL HCL 50 MG PO TABS: 50.0000 mg | ORAL_TABLET | Freq: Four times a day (QID) | ORAL | 0 refills | Status: DC | PRN

## 2023-09-13 MED ORDER — BUPIVACAINE HCL (PF) 0.25 % IJ SOLN
INTRAMUSCULAR | Status: AC
  Filled 2023-09-13: qty 30

## 2023-09-13 MED ORDER — FENTANYL CITRATE (PF) 100 MCG/2ML IJ SOLN
50.0000 ug | Freq: Once | INTRAMUSCULAR | Status: AC
  Administered 2023-09-13: 50 ug via INTRAVENOUS

## 2023-09-13 MED ORDER — AMISULPRIDE (ANTIEMETIC) 5 MG/2ML IV SOLN: 10.0000 mg | Freq: Once | INTRAVENOUS | Status: DC | PRN

## 2023-09-13 MED ORDER — HEPARIN (PORCINE) IN NACL 2-0.9 UNITS/ML
INTRAMUSCULAR | Status: AC | PRN
  Administered 2023-09-13: 1

## 2023-09-13 MED ORDER — LIDOCAINE HCL (CARDIAC) PF 100 MG/5ML IV SOSY
PREFILLED_SYRINGE | INTRAVENOUS | Status: DC | PRN
  Administered 2023-09-13: 100 mg via INTRAVENOUS

## 2023-09-13 MED ORDER — PHENYLEPHRINE HCL (PRESSORS) 10 MG/ML IV SOLN
INTRAVENOUS | Status: DC | PRN
  Administered 2023-09-13: 80 ug via INTRAVENOUS
  Administered 2023-09-13: 160 ug via INTRAVENOUS
  Administered 2023-09-13 (×7): 80 ug via INTRAVENOUS

## 2023-09-13 MED ORDER — HEPARIN (PORCINE) IN NACL 1000-0.9 UT/500ML-% IV SOLN
INTRAVENOUS | Status: AC
  Filled 2023-09-13: qty 500

## 2023-09-13 MED ORDER — HEPARIN SOD (PORK) LOCK FLUSH 100 UNIT/ML IV SOLN
INTRAVENOUS | Status: AC
  Filled 2023-09-13: qty 5

## 2023-09-13 SURGICAL SUPPLY — 50 items
APPLIER CLIP 9.375 MED OPEN (MISCELLANEOUS) ×3 IMPLANT
BAG DECANTER FOR FLEXI CONT (MISCELLANEOUS) ×3 IMPLANT
BINDER BREAST LRG (GAUZE/BANDAGES/DRESSINGS) IMPLANT
BINDER BREAST MEDIUM (GAUZE/BANDAGES/DRESSINGS) IMPLANT
BLADE SURG 11 STRL SS (BLADE) ×3 IMPLANT
BLADE SURG 15 STRL LF DISP TIS (BLADE) ×3 IMPLANT
CANISTER SUCT 1200ML W/VALVE (MISCELLANEOUS) IMPLANT
CHLORAPREP W/TINT 26 (MISCELLANEOUS) ×3 IMPLANT
CLIP APPLIE 9.375 MED OPEN (MISCELLANEOUS) IMPLANT
COVER BACK TABLE 60X90IN (DRAPES) ×3 IMPLANT
COVER MAYO STAND STRL (DRAPES) ×3 IMPLANT
COVER PROBE CYLINDRICAL 5X96 (MISCELLANEOUS) ×3 IMPLANT
DERMABOND ADVANCED .7 DNX12 (GAUZE/BANDAGES/DRESSINGS) ×3 IMPLANT
DRAPE C-ARM 42X72 X-RAY (DRAPES) ×3 IMPLANT
DRAPE LAPAROSCOPIC ABDOMINAL (DRAPES) ×3 IMPLANT
DRAPE UTILITY XL STRL (DRAPES) ×3 IMPLANT
ELECT COATED BLADE 2.86 ST (ELECTRODE) ×3 IMPLANT
ELECT REM PT RETURN 9FT ADLT (ELECTROSURGICAL) ×3 IMPLANT
ELECTRODE REM PT RTRN 9FT ADLT (ELECTROSURGICAL) ×3 IMPLANT
GLOVE BIO SURGEON STRL SZ7 (GLOVE) ×6 IMPLANT
GLOVE BIOGEL PI IND STRL 7.5 (GLOVE) ×3 IMPLANT
GOWN STRL REUS W/ TWL LRG LVL3 (GOWN DISPOSABLE) ×6 IMPLANT
HEMOSTAT ARISTA ABSORB 3G PWDR (HEMOSTASIS) IMPLANT
KIT CVR 48X5XPRB PLUP LF (MISCELLANEOUS) IMPLANT
KIT MARKER MARGIN INK (KITS) ×3 IMPLANT
KIT PORT POWER 8FR ISP CVUE (Port) IMPLANT
NDL HYPO 25X1 1.5 SAFETY (NEEDLE) ×3 IMPLANT
NDL SAFETY ECLIPSE 18X1.5 (NEEDLE) IMPLANT
NEEDLE HYPO 25X1 1.5 SAFETY (NEEDLE) ×3 IMPLANT
NS IRRIG 1000ML POUR BTL (IV SOLUTION) IMPLANT
PACK BASIN DAY SURGERY FS (CUSTOM PROCEDURE TRAY) ×3 IMPLANT
PENCIL SMOKE EVACUATOR (MISCELLANEOUS) ×3 IMPLANT
SLEEVE SCD COMPRESS KNEE MED (STOCKING) ×3 IMPLANT
SPONGE T-LAP 4X18 ~~LOC~~+RFID (SPONGE) ×3 IMPLANT
STRIP CLOSURE SKIN 1/2X4 (GAUZE/BANDAGES/DRESSINGS) ×3 IMPLANT
SUT MNCRL AB 4-0 PS2 18 (SUTURE) ×3 IMPLANT
SUT MON AB 5-0 PS2 18 (SUTURE) IMPLANT
SUT PROLENE 2 0 SH DA (SUTURE) ×3 IMPLANT
SUT SILK 2 0 SH (SUTURE) IMPLANT
SUT SILK 2 0 TIES 17X18 (SUTURE) IMPLANT
SUT VIC AB 2-0 SH 27XBRD (SUTURE) ×3 IMPLANT
SUT VIC AB 3-0 SH 27X BRD (SUTURE) ×3 IMPLANT
SUT VIC AB 5-0 PS2 18 (SUTURE) IMPLANT
SYR 5ML LUER SLIP (SYRINGE) ×3 IMPLANT
SYR CONTROL 10ML LL (SYRINGE) ×3 IMPLANT
TOWEL GREEN STERILE FF (TOWEL DISPOSABLE) ×3 IMPLANT
TRACER MAGTRACE VIAL (MISCELLANEOUS) IMPLANT
TRAY FAXITRON CT DISP (TRAY / TRAY PROCEDURE) ×3 IMPLANT
TUBE CONNECTING 20X1/4 (TUBING) IMPLANT
YANKAUER SUCT BULB TIP NO VENT (SUCTIONS) IMPLANT

## 2023-09-13 NOTE — Progress Notes (Signed)
Assisted Dr. Lanetta Inch with right, pectoralis, ultrasound guided block. Side rails up, monitors on throughout procedure. See vital signs in flow sheet. Tolerated Procedure well.

## 2023-09-13 NOTE — Op Note (Signed)
 Preoperative diagnosis: Right breast cancer, clinical stage I Postoperative diagnosis: Same as above Procedure: 1.  Right internal jugular vein port placement with ultrasound guidance 2.  Right seed bracketed lumpectomy 3.  Injection of mag trace for sentinel lymph node identification 4.  Right deep axillary sentinel lymph node biopsy Surgical Dr. Harden Mo Anesthesia: General With a pectoral block Estimated blood loss: Less than 50 cc Specimens: 1.  Right deep axillary sentinel lymph nodes with counts of 3500 2.  Right seed bracketed lumpectomy containing 3 seeds and 3 clips 3.  Additional superior and medial margins marked short superior, long lateral, double deep Complications: None Drains: None Sponge needle count was correct completion Disposition recovery stable condition  Indications: This is a 78 year old female who palpated a right breast mass on her exam.  She underwent mammography that showed a mass to measure 1.8 cm in greatest dimension with a small satellite next to it.  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  We discussed a seed bracketed lumpectomy, axillary sentinel lymph node biopsy as well as port placement.  Procedure: After informed consent was obtained she first underwent a pectoral block.  She was given antibiotics.  SCDs were in place.  She was placed under general anesthesia without complication.  She was prepped and draped in the standard sterile surgical fashion.  A surgical timeout was then performed.  I I injected her with 2 cc of mag trace first.  I did the subareolar as well as peritumoral.  This was then massaged for 5 minutes.  I then placed the port.  I was able to access the internal jugular vein using ultrasound guidance.  The wire was placed.  This was confirmed to be in correct position by fluoroscopy.  The wire was in the internal jugular vein  by ultrasound as well.  I then created a pocket below the clavicle.  I tunneled the line between the 2 sites.  I then placed the dilator over the wire using fluoroscopy and remove the wire assembly.  The line was passed through the sheath.  The sheath was then peeled away.  The line was pulled back to be at the cavoatrial junction.  The line is in position and is ready for use.  I then was able to hook the line into the port.  This was sutured down with a 2-0 Prolene suture.  I then closed this with 3-0 Vicryl for Monocryl.  The port was accessed and flushed easily and aspirated blood.  Heparin was placed in the port.  I then did the lumpectomy.  I was able to find a 3 seeds.  This was a fair amount of her lateral breast tissue.  I elected to make a curvilinear incision overlying the seeds.  I used cautery and the neoprobe to remove all 3 seeds with an attempt to get a clear margin.  The posterior margin is the muscle.  This was confirmed by mammography.  I thought I was close to 2 margins and I removed additional tissue at these.  I placed some clips in the cavity.  Eventually I mobilized the tissue and closed the breast tissue with 2-0 Vicryl suture.  The skin was closed with 3-0 Vicryl and 4-0 Monocryl.  Eventually glue and Steri-Strips were placed.  I then made incision in the low axilla and carried this through the axillary fascia.  I was able  to identify what appeared to be a couple of brown nodes and they had a very high level activity.  I remove these.  There were no additional brown nodes or any activity present.  I then obtained hemostasis.  I closed this with 2-0 Vicryl, 3-0 Vicryl and 4-0 Monocryl.  Glue and Steri-Strips were placed over this.  She tolerated this was extubated and transferred to recovery stable.

## 2023-09-13 NOTE — Discharge Instructions (Addendum)
 [facility] Office Phone Number [phone]  POST OP INSTRUCTIONS Take 400 mg of ibuprofen every 8 hours or 650 mg tylenol every 6 hours for next 72 hours then as needed. Use ice several times daily also.  A prescription for pain medication may be given to you upon discharge.  Take your pain medication as prescribed, if needed.  If narcotic pain medicine is not needed, then you may take acetaminophen (Tylenol), naprosyn (Alleve) or ibuprofen (Advil) as needed. Take your usually prescribed medications unless otherwise directed If you need a refill on your pain medication, please contact your pharmacy.  They will contact our office to request authorization.  Prescriptions will not be filled after 5pm or on week-ends. You should eat very light the first 24 hours after surgery, such as soup, crackers, pudding, etc.  Resume your normal diet the day after surgery. Most patients will experience some swelling and bruising in the breast.  Ice packs and a good support bra will help.  Wear the breast binder provided or a sports bra for 72 hours day and night.  After that wear a sports bra during the day until you return to the office. Swelling and bruising can take several days to resolve.  It is common to experience some constipation if taking pain medication after surgery.  Increasing fluid intake and taking a stool softener will usually help or prevent this problem from occurring.  A mild laxative (Milk of Magnesia or Miralax) should be taken according to package directions if there are no bowel movements after 48 hours. I used skin glue on the incision, you may shower in 24 hours.  The glue will flake off over the next 2-3 weeks.  Any sutures or staples will be removed at the office during your follow-up visit. ACTIVITIES:  You may resume regular daily activities (gradually increasing) beginning the next day.  Wearing a good support bra or sports bra minimizes pain and swelling.  You may have  sexual intercourse when it is comfortable. You may drive when you no longer are taking prescription pain medication, you can comfortably wear a seatbelt, and you can safely maneuver your car and apply brakes. RETURN TO WORK:  ______________________________________________________________________________________ Heather Lewis should see your doctor in the office for a follow-up appointment approximately two weeks after your surgery.  Your doctor's nurse will typically make your follow-up appointment when she calls you with your pathology report.  Expect your pathology report 3-4 business days after your surgery.  You may call to check if you do not hear from [place] after three days. OTHER INSTRUCTIONS: _______________________________________________________________________________________________ _____________________________________________________________________________________________________________________________________ _____________________________________________________________________________________________________________________________________ _____________________________________________________________________________________________________________________________________  WHEN TO CALL DR WAKEFIELD: Fever over 101.0 Nausea and/or vomiting. Extreme swelling or bruising. Continued bleeding from incision. Increased pain, redness, or drainage from the incision.  The clinic staff is available to answer your questions during regular business hours.  Please don't hesitate to call and ask to speak to one of the nurses for clinical concerns.  If you have a medical emergency, go to the nearest emergency room or call 911.  A surgeon from [facility] is always on call at the hospital.  For further questions, please visit [email or web address]    Post Anesthesia Home Care Instructions  Activity: Get plenty of rest for the remainder of the day. A responsible individual must stay  with you for 24 hours following the procedure.  For the next 24 hours, DO NOT: -Drive a car -Advertising copywriter -Drink alcoholic beverages -Take any medication unless instructed  by your physician -Make any legal decisions or sign important papers.  Meals: Start with liquid foods such as gelatin or soup. Progress to regular foods as tolerated. Avoid greasy, spicy, heavy foods. If nausea and/or vomiting occur, drink only clear liquids until the nausea and/or vomiting subsides. Call your physician if vomiting continues.  Special Instructions/Symptoms: Your throat may feel dry or sore from the anesthesia or the breathing tube placed in your throat during surgery. If this causes discomfort, gargle with warm salt water. The discomfort should disappear within 24 hours.  If you had a scopolamine patch placed behind your ear for the management of post- operative nausea and/or vomiting:  1. The medication in the patch is effective for 72 hours, after which it should be removed.  Wrap patch in a tissue and discard in the trash. Wash hands thoroughly with soap and water. 2. You may remove the patch earlier than 72 hours if you experience unpleasant side effects which may include dry mouth, dizziness or visual disturbances. 3. Avoid touching the patch. Wash your hands with soap and water after contact with the patch.     May have Tylenol today after 12:35 PM

## 2023-09-13 NOTE — Anesthesia Preprocedure Evaluation (Addendum)
 Anesthesia Evaluation  Patient identified by MRN, date of birth, ID band Patient awake    Reviewed: Allergy & Precautions, H&P , NPO status , Patient's Chart, lab work & pertinent test results, reviewed documented beta blocker date and time   Airway Mallampati: II  TM Distance: >3 FB     Dental no notable dental hx.    Pulmonary neg pulmonary ROS, neg COPD   breath sounds clear to auscultation       Cardiovascular (-) hypertension(-) CAD, (-) Past MI, (-) Cardiac Stents and (-) CABG negative cardio ROS (-) dysrhythmias (-) Valvular Problems/Murmurs Rhythm:Regular Rate:Normal     Neuro/Psych  Headaches, neg Seizures  negative psych ROS   GI/Hepatic negative GI ROS, Neg liver ROS,,,(+) neg Cirrhosis        Endo/Other  negative endocrine ROS    Renal/GU Renal diseasenegative Renal ROS  negative genitourinary   Musculoskeletal negative musculoskeletal ROS (+)    Abdominal   Peds negative pediatric ROS (+)  Hematology negative hematology ROS (+)   Anesthesia Other Findings   Reproductive/Obstetrics negative OB ROS                             Anesthesia Physical Anesthesia Plan  ASA: 2  Anesthesia Plan: General and Regional   Post-op Pain Management: Regional block* and Tylenol PO (pre-op)*   Induction: Intravenous  PONV Risk Score and Plan: 3 and Ondansetron, Dexamethasone and Treatment may vary due to age or medical condition  Airway Management Planned: LMA  Additional Equipment:   Intra-op Plan:   Post-operative Plan: Extubation in OR  Informed Consent: I have reviewed the patients History and Physical, chart, labs and discussed the procedure including the risks, benefits and alternatives for the proposed anesthesia with the patient or authorized representative who has indicated his/her understanding and acceptance.     Dental advisory given  Plan Discussed with:  CRNA  Anesthesia Plan Comments:         Anesthesia Quick Evaluation

## 2023-09-13 NOTE — Anesthesia Postprocedure Evaluation (Signed)
 Anesthesia Post Note  Patient: Heather Lewis  Procedure(s) Performed: BREAST LUMPECTOMY WITH RADIOACTIVE SEED LOCALIZATION (Right: Breast) BIOPSY, LYMPH NODE, SENTINEL, AXILLARY (Right: Axilla) INSERTION, TUNNELED CENTRAL VENOUS DEVICE, WITH PORT (Right: Chest)     Patient location during evaluation: PACU Anesthesia Type: Regional and General Level of consciousness: awake and alert Pain management: pain level controlled Vital Signs Assessment: post-procedure vital signs reviewed and stable Respiratory status: spontaneous breathing, nonlabored ventilation, respiratory function stable and patient connected to nasal cannula oxygen Cardiovascular status: blood pressure returned to baseline and stable Postop Assessment: no apparent nausea or vomiting Anesthetic complications: no  No notable events documented.  Last Vitals:  Vitals:   09/13/23 0945 09/13/23 1023  BP: 130/61 133/60  Pulse: 85 87  Resp: 18 16  Temp:  36.7 C  SpO2: 96% 97%    Last Pain:  Vitals:   09/13/23 1023  TempSrc:   PainSc: 0-No pain                 Cong Hightower L Darrion Macaulay

## 2023-09-13 NOTE — Transfer of Care (Signed)
 Immediate Anesthesia Transfer of Care Note  Patient: Heather Lewis  Procedure(s) Performed: BREAST LUMPECTOMY WITH RADIOACTIVE SEED LOCALIZATION (Right: Breast) BIOPSY, LYMPH NODE, SENTINEL, AXILLARY (Right: Axilla) INSERTION, TUNNELED CENTRAL VENOUS DEVICE, WITH PORT (Right: Chest)  Patient Location: PACU  Anesthesia Type:General  Level of Consciousness: drowsy  Airway & Oxygen Therapy: Patient Spontanous Breathing and Patient connected to nasal cannula oxygen  Post-op Assessment: Report given to RN and Post -op Vital signs reviewed and stable  Post vital signs: Reviewed and stable  Last Vitals:  Vitals Value Taken Time  BP 125/53 09/13/23 0922  Temp    Pulse 89 09/13/23 0924  Resp 14 09/13/23 0924  SpO2 100 % 09/13/23 0924  Vitals shown include unfiled device data.  Last Pain:  Vitals:   09/13/23 1610  TempSrc: Temporal  PainSc: 0-No pain      Patients Stated Pain Goal: 3 (09/13/23 9604)  Complications: No notable events documented.

## 2023-09-13 NOTE — Anesthesia Procedure Notes (Signed)
 Anesthesia Regional Block: Pectoralis block   Pre-Anesthetic Checklist: , timeout performed,  Correct Patient, Correct Site, Correct Laterality,  Correct Procedure, Correct Position, site marked,  Risks and benefits discussed,  Pre-op evaluation,  At surgeon's request and post-op pain management  Laterality: Right  Prep: Maximum Sterile Barrier Precautions used, chloraprep       Needles:  Injection technique: Single-shot  Needle Type: Echogenic Stimulator Needle     Needle Length: 9cm  Needle Gauge: 21     Additional Needles:   Procedures:,,,, ultrasound used (permanent image in chart),,    Narrative:  Start time: 09/13/2023 7:05 AM End time: 09/13/2023 7:10 AM Injection made incrementally with aspirations every 5 mL. Anesthesiologist: Elmer Picker, MD

## 2023-09-13 NOTE — Anesthesia Procedure Notes (Signed)
 Procedure Name: LMA Insertion Date/Time: 09/13/2023 7:37 AM  Performed by: Earmon Phoenix, CRNAPre-anesthesia Checklist: Patient identified, Emergency Drugs available, Suction available, Patient being monitored and Timeout performed Patient Re-evaluated:Patient Re-evaluated prior to induction Oxygen Delivery Method: Circle system utilized Preoxygenation: Pre-oxygenation with 100% oxygen Induction Type: IV induction Ventilation: Mask ventilation without difficulty LMA: LMA inserted LMA Size: 3.0 Number of attempts: 1 Placement Confirmation: positive ETCO2 and breath sounds checked- equal and bilateral Tube secured with: Tape Dental Injury: Teeth and Oropharynx as per pre-operative assessment

## 2023-09-13 NOTE — Interval H&P Note (Signed)
 History and Physical Interval Note:  09/13/2023 7:08 AM  Heather Lewis  has presented today for surgery, with the diagnosis of RIGHT BREAST CANCER.  The various methods of treatment have been discussed with the patient and family. After consideration of risks, benefits and other options for treatment, the patient has consented to  Procedure(s) with comments: BREAST LUMPECTOMY WITH RADIOACTIVE SEED LOCALIZATION (Right) - Right breast radioactive seed bracketed lumpectomy BIOPSY, LYMPH NODE, SENTINEL, AXILLARY (Right) - right INSERTION, TUNNELED CENTRAL VENOUS DEVICE, WITH PORT (N/A) - GEN w/PEC BLOCK as a surgical intervention.  The patient's history has been reviewed, patient examined, no change in status, stable for surgery.  I have reviewed the patient's chart and labs.  Questions were answered to the patient's satisfaction.     Emelia Loron

## 2023-09-14 ENCOUNTER — Encounter (HOSPITAL_BASED_OUTPATIENT_CLINIC_OR_DEPARTMENT_OTHER): Payer: Self-pay | Admitting: General Surgery

## 2023-09-20 ENCOUNTER — Encounter: Payer: Self-pay | Admitting: *Deleted

## 2023-09-21 ENCOUNTER — Other Ambulatory Visit: Payer: Self-pay | Admitting: General Surgery

## 2023-09-25 ENCOUNTER — Ambulatory Visit: Payer: Self-pay | Admitting: Genetic Counselor

## 2023-09-25 ENCOUNTER — Encounter: Payer: Self-pay | Admitting: Genetic Counselor

## 2023-09-25 DIAGNOSIS — Z1379 Encounter for other screening for genetic and chromosomal anomalies: Secondary | ICD-10-CM

## 2023-09-25 DIAGNOSIS — C50411 Malignant neoplasm of upper-outer quadrant of right female breast: Secondary | ICD-10-CM

## 2023-09-25 NOTE — Progress Notes (Signed)
 HPI: Heather Lewis was previously seen in the [facility] clinic due to a personal of breast cancer and concerns regarding a hereditary predisposition to cancer.    Heather Lewis's recent genetic test results were disclosed to her, as were recommendations warranted by these results. These results and recommendations are discussed in more detail below.    FAMILY HISTORY:  We obtained a detailed, 4-generation family history.  Significant diagnoses are listed below: Family History  Problem Relation Age of Onset   Colon polyps Mother    Lung cancer Mother        d. 28   Colon polyps Father    Melanoma Father    Colon cancer Brother        and polyps; dx 50s   Lung cancer Maternal Aunt    Lung cancer Maternal Grandfather    Breast cancer Niece        dx 5s        Heather Lewis is unaware of previous family history of genetic testing for hereditary cancer risks. There is no reported Ashkenazi Jewish ancestry. There is no known consanguinity.   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    No other pathogenic variants were identified in the [facility] CancerNext-Expanded +RNAinsight Panel.  The CancerNext-Expanded gene panel offered by [facility] and includes sequencing, rearrangement, and RNA analysis for the following 76 genes: AIP, ALK, APC, ATM, AXIN2, BAP1, BARD1, BMPR1A, BRCA1, BRCA2, BRIP1, CDC73, CDH1, CDK4, CDKN1B, CDKN2A, CEBPA, CHEK2, CTNNA1, DDX41, DICER1, ETV6, FH, FLCN, GATA2, LZTR1, MAX, MBD4, MEN1, MET, MLH1, MSH2, MSH3, MSH6, MUTYH, NF1, NF2, NTHL1, PALB2, PHOX2B, PMS2, POT1, PRKAR1A, PTCH1, PTEN, RAD51C, RAD51D, RB1, RET, RUNX1, SDHA, SDHAF2, SDHB, SDHC, SDHD, SMAD4, SMARCA4, SMARCB1, SMARCE1, STK11, SUFU, TMEM127, TP53, TSC1, TSC2, VHL, and WT1  (sequencing and deletion/duplication); EGFR, HOXB13, KIT, MITF, PDGFRA, POLD1, and POLE (sequencing only); EPCAM and GREM1 (deletion/duplication only).      A copy of the test report has been scanned into Epic and is located under the Molecular Pathology section of the Results Review tab.  Report date is 09/12/2023.     Of note, this result does not explain the personal/family history of breast cancer.  Even though a pathogenic variant related to breast cancer was not identified, possible explanations for the cancer in the family may include: The cancers in Heather Lewis and/or her family may not be hereditary but rather sporadic/familial or due to other genetic and environmental factors. There may be a gene mutation in one of these genes that current testing methods cannot detect but that chance is small. There could be another gene that has not yet been discovered, or that we have not yet tested, that is responsible for the cancer diagnoses in the family.  It is also possible there is a hereditary cause for the cancer in the family that Heather Lewis did not inherit.   Therefore, it is important to remain in touch with cancer genetics in the future so that we can continue to offer Heather Lewis the most up to date genetic testing.      MUTYH HETEROZYGOUS RECOMMENDATIONS:  MAP (MUTYH-Associated Polyposis) occurs in individuals with two pathogenic variants in MUTYH--one in each copy of their MUTYH genes. Individuals with a single (heterozygous) MUTYH pathogenic variant are not affected with MAP but  instead are carriers.  About 1-2% of individuals have one pathogenic mutation in MUTYH.    Individuals with one MUTYH mutation (MUTYH carriers) are not known to be at an increased risk for colon cancer or polyps.  Additionally, there is insufficient evidence that they are at higher risk for other cancers over the general population.   No changes to cancer screening or management are recommended based on this  result.  General population screening is appropriate for individuals with single pathogenic variants in MUTYH.  They should be managed based on personal history and first-degree family history of colon cancer.    This information is based on current understanding of the gene and may change in the future.   FAMILY MEMBERS:  Hereditary predisposition to cancer due to pathogenic variants in the MUTYH gene has autosomal recessive inheritance. This means that, while an individual with a pathogenic variant has a 50% chance of passing the MUTYH mutation on to his/her offspring, they will not be affected unless they inherit a second mutation from their other parent.   Family members can consider genetic testing for this familial pathogenic variant. As there are no cancer risks associated with a single pathogenic variant in the MUTYH gene, individuals in the family are not recommended to have testing until they reach at least 78 years of age. They may contact our office at [phone] for more information or to schedule an appointment.  Complimentary testing through [facility] for the familial variant is available for 90 days after the report date (September 12, 2023).  Family members who live outside of the area are encouraged to find a genetic counselor in their area by visiting: [email or web address].  Individuals in this family might be at some increased risk of developing cancer, over the general population risk, due to the family history of cancer.  Individuals in the family should notify their providers of the family history of cancer. We recommend women in this family have a yearly mammogram beginning at age 45, or 55 years younger than the earliest onset of cancer, an annual clinical breast exam, and perform monthly breast self-exams.  Risk models that take into account family history and hormonal history may be helpful in determining appropriate breast cancer screening options for  family members.  Based on the family history, we recommend her niece, who was diagnosed with breast cancer at age 21, have genetic counseling and comprehensive genetic testing.  Heather Lewis thinks her niece already had genetic testing at the time of her diagnosis.      We encourage Heather Lewis to remain in contact with [place] in cancer genetics, so we can update Heather Lewis's personal and family histories, and inform her of advances in cancer genetics that may be of benefit for the entire family. Ms. Lenger knows she is also welcome to call with any questions or concerns, at any time.    Hadasah Brugger M. Rennie Plowman, MS, [facility] Genetic Counselor [email or web address] (P) [phone]

## 2023-09-26 ENCOUNTER — Other Ambulatory Visit

## 2023-09-26 ENCOUNTER — Ambulatory Visit: Admitting: Pharmacist

## 2023-09-26 ENCOUNTER — Ambulatory Visit: Admitting: Hematology and Oncology

## 2023-09-27 ENCOUNTER — Inpatient Hospital Stay

## 2023-09-27 ENCOUNTER — Other Ambulatory Visit: Payer: Self-pay

## 2023-09-27 ENCOUNTER — Inpatient Hospital Stay: Attending: Hematology and Oncology | Admitting: Hematology and Oncology

## 2023-09-27 ENCOUNTER — Inpatient Hospital Stay: Admitting: Pharmacist

## 2023-09-27 VITALS — BP 129/57 | HR 66 | Temp 97.7°F | Resp 17 | Wt 117.4 lb

## 2023-09-27 DIAGNOSIS — Z1732 Human epidermal growth factor receptor 2 negative status: Secondary | ICD-10-CM | POA: Diagnosis not present

## 2023-09-27 DIAGNOSIS — C50411 Malignant neoplasm of upper-outer quadrant of right female breast: Secondary | ICD-10-CM | POA: Diagnosis present

## 2023-09-27 DIAGNOSIS — Z171 Estrogen receptor negative status [ER-]: Secondary | ICD-10-CM | POA: Diagnosis not present

## 2023-09-27 DIAGNOSIS — Z1722 Progesterone receptor negative status: Secondary | ICD-10-CM | POA: Diagnosis not present

## 2023-09-27 MED ORDER — LIDOCAINE-PRILOCAINE 2.5-2.5 % EX CREA: TOPICAL_CREAM | CUTANEOUS | 3 refills | Status: AC

## 2023-09-27 MED ORDER — DEXAMETHASONE 4 MG PO TABS: ORAL_TABLET | ORAL | 1 refills | Status: DC

## 2023-09-27 MED ORDER — ONDANSETRON HCL 8 MG PO TABS: 8.0000 mg | ORAL_TABLET | Freq: Three times a day (TID) | ORAL | 1 refills | Status: AC | PRN

## 2023-09-27 MED ORDER — PROCHLORPERAZINE MALEATE 10 MG PO TABS: 10.0000 mg | ORAL_TABLET | Freq: Four times a day (QID) | ORAL | 1 refills | Status: AC | PRN

## 2023-09-27 NOTE — Progress Notes (Signed)
  Cancer Center CONSULT NOTE  Patient Care Team: Gordan Payment., MD as PCP - General (Internal Medicine) Pershing Proud, RN as Oncology Nurse Navigator Donnelly Angelica, RN as Oncology Nurse Navigator Rachel Moulds, MD as Consulting Physician (Hematology and Oncology) Emelia Loron, MD as Consulting Physician (General Surgery) Antony Blackbird, MD as Consulting Physician (Radiation Oncology)  CHIEF COMPLAINTS/PURPOSE OF CONSULTATION:  Right breast cancer.  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  S/p lumpectomy with 1.8 cm triple neg breast cancer, neg margins for invasive carcinoma, LN neg. 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  Assessment & Plan Triple Negative Breast Cancer High recurrence risk post-surgery given triple neg BC - Administer chemotherapy starting Nov 06, 2023, with four cycles of TC every three weeks. - Monitor for side effects: fatigue, nausea, alopecia, constipation, neuropathy, immunosuppression. - Administer granulocyte colony-stimulating factor to prevent neutropenia. - Monitor hepatic and renal function during chemotherapy. - Attend chemotherapy education class.  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 Margin-clearing surgery required. - Perform margin-clearing surgery on October 08, 2023.  Radiation Therapy Planned post-chemotherapy   Follow-up  - Start chemotherapy on Nov 06, 2023, post-granddaughter's graduation.  Rachel Moulds MD  Orders Placed This Encounter  Procedures   CBC with Differential (Cancer Center  Only)    Standing Status:   Future    Expected Date:   11/06/2023    Expiration Date:   11/05/2024   CMP (Cancer Center only)    Standing Status:   Future    Expected Date:   11/06/2023    Expiration Date:   11/05/2024   CBC with Differential (Cancer Center Only)    Standing Status:   Future    Expected Date:   11/27/2023    Expiration Date:   11/26/2024   CMP (Cancer Center only)    Standing Status:   Future    Expected Date:   11/27/2023    Expiration Date:   11/26/2024   CBC with Differential (Cancer Center Only)    Standing Status:   Future    Expected Date:   12/18/2023    Expiration Date:   12/17/2024   CMP (Cancer Center only)    Standing Status:   Future    Expected Date:   12/18/2023    Expiration Date:   12/17/2024   CBC with Differential (Cancer Center Only)    Standing Status:   Future    Expected Date:   01/08/2024    Expiration Date:   01/07/2025   CMP (Cancer Center only)    Standing Status:   Future    Expected Date:   01/08/2024    Expiration Date:   01/07/2025     HISTORY OF PRESENTING ILLNESS:  NAVAH GRONDIN 78 y.o. female is here because of newly diagnosed breast cancer  Oncology History  Malignant neoplasm of upper-outer quadrant of right breast in female, estrogen receptor negative (HCC)  08/28/2023 Initial Diagnosis   Malignant neoplasm of upper-outer quadrant of right breast in female, estrogen receptor negative (HCC)    Mammogram   Highly suspicious 1.8 cm palpable mass  right breast 10 o'clock position. Highly suspicious 0.9 cm mass noted adjacent to the dominant palpable mass at the 10 o'clock position. The distance between the 2 masses measures less than 1 cm. Indeterminate grouped calcifications in the central outer right breast at posterior depth.    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  Report date is 09/12/2023.   The CancerNext-Expanded gene panel offered by [facility] and includes sequencing, rearrangement, and RNA analysis for the following 76 genes: AIP, ALK, APC, ATM, AXIN2, BAP1, BARD1, BMPR1A, BRCA1, BRCA2, BRIP1, CDC73, CDH1, CDK4, CDKN1B, CDKN2A, CEBPA, CHEK2, CTNNA1, DDX41, DICER1, ETV6, FH, FLCN, GATA2, LZTR1, MAX, MBD4, MEN1, MET, MLH1, MSH2, MSH3, MSH6, MUTYH, NF1, NF2, NTHL1, PALB2, PHOX2B, PMS2, POT1, PRKAR1A, PTCH1, PTEN, RAD51C, RAD51D, RB1, RET, RUNX1, SDHA, SDHAF2, SDHB, SDHC, SDHD, SMAD4, SMARCA4, SMARCB1, SMARCE1, STK11, SUFU, TMEM127, TP53, TSC1, TSC2, VHL, and WT1 (sequencing and deletion/duplication); EGFR, HOXB13, KIT, MITF, PDGFRA, POLD1, and POLE (sequencing only); EPCAM and GREM1 (deletion/duplication only).    09/13/2023 Definitive Surgery   Right breast lumpectomy, foci of IDC discontinuously involving a fibrotic area of 1.8 cms, grade 3, extensive cancerization of lobules, DCIS high grade, neg margins,  Rt axillary sentinel LN negative. Additional medial margin ( deep resection margin pos for DCIS)   11/06/2023 -  Chemotherapy   Patient is on Treatment Plan : BREAST TC q21d      Discussed the use of AI scribe software for clinical note transcription with the patient, who gave verbal consent to proceed.  History of Present Illness   The patient is a 78 year old with breast cancer who presents for follow-up regarding her treatment plan.  She has undergone surgery for invasive breast cancer, with a tumor measuring approximately 1.8 centimeters removed. The invasive cancer has been cleared, but additional surgery is needed to clear the margins of  ductal carcinoma in situ (DCIS), which was extensive and positive at the margins. She has a port placed for chemotherapy administration and notes a bruise at the site of the port.  She is concerned about the necessity of chemotherapy given that the visible tumor has been removed. She discusses potential side effects of chemotherapy, including fatigue, nausea, hair loss, and neuropathy. She is already experiencing fatigue and has been resting more than usual.  She is active and participates in choir rehearsals but acknowledges needing to rest more often. She plans to attend her granddaughter's graduation in May and is coordinating her treatment schedule around this event. She wants to maintain her usual activities while being mindful of her health during treatment.  She has a family history of cancer, including a cousin with breast cancer, a brother with colon cancer, and a mother with lung cancer. She has undergone genetic testing, which did not indicate a hereditary cancer syndrome.  No fever or other systemic symptoms. Reports fatigue and bruising at the port site.   MEDICAL HISTORY:  Past Medical History:  Diagnosis Date   Age-related macular degeneration    Bunion of right foot 08/2017   Complication of anesthesia  hard to wake up from colonoscopy when Propofol not used   Dental crowns present    Metatarsalgia of right foot 08/2017   2nd toe   Migraines    with change of weather   TMJ syndrome     SURGICAL HISTORY: Past Surgical History:  Procedure Laterality Date   APPENDECTOMY  1960   AXILLARY SENTINEL NODE BIOPSY Right 09/13/2023   Procedure: BIOPSY, LYMPH NODE, SENTINEL, AXILLARY;  Surgeon: Emelia Loron, MD;  Location: [facility];  Service: General;  Laterality: Right;  right   BREAST BIOPSY Right 08/23/2023   [place] RT BREAST BX W LOC DEV 1ST LESION IMG BX SPEC US GUIDE 08/23/2023 [facility]   BREAST BIOPSY Right 08/23/2023   [place] RT BREAST BX W LOC DEV  EA ADD LESION IMG BX SPEC US GUIDE 08/23/2023 [facility]MO[facility]   BREAST BIOPSY Right 08/23/2023   MM RT BREAST BX W LOC DEV 1ST LESION IMAGE BX SPEC STEREO GUIDE 08/23/2023 [facility]MOGR[facility]   BREAST BIOPSY  09/11/2023   MM RT RADIOACTIVE SEED EA ADD LESION LOC MAMMO GUIDE 09/11/2023 [facility]MO[facility]   BREAST BIOPSY  09/11/2023   MM RT RADIOACTIVE SEED EA ADD LESION LOC MAMMO GUIDE 09/11/2023 [facility]   BREAST BIOPSY  09/11/2023   MM RT RADIOACTIVE SEED LOC MAMMO GUIDE 09/11/2023 [facility]   BREAST LUMPECTOMY WITH RADIOACTIVE SEED LOCALIZATION Right 09/13/2023   Procedure: BREAST LUMPECTOMY WITH RADIOACTIVE SEED LOCALIZATION;  Surgeon: Emelia Loron, MD;  Location: [facility];  Service: General;  Laterality: Right;  Right breast radioactive seed bracketed lumpectomy   BUNIONECTOMY WITH WEIL OSTEOTOMY Right 09/06/2017   Procedure: Right Lapidus, Modified Orpha Bur;  Surgeon: Toni Arthurs, MD;  Location: [facility];  Service: Orthopedics;  Laterality: Right;   COLONOSCOPY WITH PROPOFOL  08/08/2016   HAMMERTOE RECONSTRUCTION WITH WEIL OSTEOTOMY  06/27/2018   Procedure: LEFT SECOND HAMMERTOE RECONSTRUCTION WITH WEIL OSTEOTOMY;  Surgeon: Toni Arthurs, MD;  Location: [facility];  Service: Orthopedics;;   PORTACATH PLACEMENT Right 09/13/2023   Procedure: INSERTION, TUNNELED CENTRAL VENOUS DEVICE, WITH PORT;  Surgeon: Emelia Loron, MD;  Location: [facility];  Service: General;  Laterality: Right;  GEN w/PEC BLOCK   TARSAL METATARSAL ARTHRODESIS Left 06/27/2018   Procedure: Left modified McBride and lapidus 1st tarsometatarsal arthrodesis;  Surgeon: Toni Arthurs, MD;  Location: [facility];  Service: Orthopedics;  Laterality: Left;    TONSILLECTOMY AND ADENOIDECTOMY  1954    SOCIAL HISTORY: Social History   Socioeconomic History   Marital status: Married    Spouse name: Not on file   Number  of children: Not on file   Years of education: Not on file   Highest education level: Not on file  Occupational History   Not on file  Tobacco Use   Smoking status: Never   Smokeless tobacco: Never  Vaping Use   Vaping status: Never Used  Substance and Sexual Activity   Alcohol use: Yes    Comment: socially   Drug use: No   Sexual activity: Not on file  Other Topics Concern   Not on file  Social History Narrative   Not on file   Social Drivers of Health   Financial Resource Strain: Not on file  Food Insecurity: No Food Insecurity (08/29/2023)   Hunger Vital Sign    Worried About Running Out of Food in the Last Year: Never true    Ran Out of Food  in the Last Year: Never true  Transportation Needs: No Transportation Needs (08/29/2023)   PRAPARE - Administrator, Civil Service (Medical): No    Lack of Transportation (Non-Medical): No  Physical Activity: Not on file  Stress: Not on file  Social Connections: Not on file  Intimate Partner Violence: Not At Risk (08/29/2023)   Humiliation, Afraid, Rape, and Kick questionnaire    Fear of Current or Ex-Partner: No    Emotionally Abused: No    Physically Abused: No    Sexually Abused: No    FAMILY HISTORY: Family History  Problem Relation Age of Onset   Colon polyps Mother    Lung cancer Mother        d. 96   Colon polyps Father    Melanoma Father    Colon cancer Brother        and polyps; dx 40s   Lung cancer Maternal Aunt    Lung cancer Maternal Grandfather    Breast cancer Niece        dx 79s    ALLERGIES:  is allergic to nitrates, organic and oxycodone.  MEDICATIONS:  Current Outpatient Medications  Medication Sig Dispense Refill   cholecalciferol (VITAMIN D) 1000 units tablet Take 1,000 Units by mouth daily.     dexamethasone (DECADRON) 4 MG tablet Take 2 tabs by mouth 2 times daily starting day before chemo. Then take 2 tabs daily for 2 days starting day after chemo. Take with food. 30 tablet 1    docusate sodium (COLACE) 100 MG capsule Take 1 capsule (100 mg total) by mouth 2 (two) times daily. While taking narcotic pain medicine. (Patient not taking: Reported on 09/27/2023) 30 capsule 0   ibuprofen (ADVIL,MOTRIN) 800 MG tablet Take 800 mg by mouth every 8 (eight) hours as needed. migraines  (Patient not taking: Reported on 09/27/2023)     lidocaine-prilocaine (EMLA) cream Apply to affected area once 30 g 3   Multiple Vitamins-Minerals (EYE VITAMINS PO) Take by mouth.     ondansetron (ZOFRAN) 8 MG tablet Take 1 tablet (8 mg total) by mouth every 8 (eight) hours as needed for nausea or vomiting. Start on the third day after chemotherapy. 30 tablet 1   prochlorperazine (COMPAZINE) 10 MG tablet Take 1 tablet (10 mg total) by mouth every 6 (six) hours as needed for nausea or vomiting. 30 tablet 1   senna (SENOKOT) 8.6 MG TABS tablet Take 2 tablets (17.2 mg total) by mouth 2 (two) times daily. (Patient not taking: Reported on 09/27/2023) 30 each 0   No current facility-administered medications for this visit.     PHYSICAL EXAMINATION: ECOG PERFORMANCE STATUS: 0 - Asymptomatic  Vitals:   09/27/23 1121  BP: (!) 129/57  Pulse: 66  Resp: 17  Temp: 97.7 F (36.5 C)  SpO2: 100%    Filed Weights   09/27/23 1121  Weight: 117 lb 6.4 oz (53.3 kg)     GENERAL:alert, no distress and comfortable   LABORATORY DATA:  I have reviewed the data as listed Lab Results  Component Value Date   WBC 6.3 08/29/2023   HGB 12.6 08/29/2023   HCT 38.5 08/29/2023   MCV 89.7 08/29/2023   PLT 258 08/29/2023     Chemistry      Component Value Date/Time   NA 139 08/29/2023 1223   K 4.4 08/29/2023 1223   CL 105 08/29/2023 1223   CO2 29 08/29/2023 1223   BUN 16 08/29/2023 1223   CREATININE 0.77  08/29/2023 1223      Component Value Date/Time   CALCIUM 8.9 08/29/2023 1223   ALKPHOS 69 08/29/2023 1223   AST 18 08/29/2023 1223   ALT 13 08/29/2023 1223   BILITOT 0.3 08/29/2023 1223        RADIOGRAPHIC STUDIES: I have personally reviewed the radiological images as listed and agreed with the findings in the report. DG C-Arm 1-60 Min Result Date: 09/13/2023 CLINICAL DATA:  surgery EXAM: DG C-ARM 1-60 MIN FLUOROSCOPY: Fluoroscopy Time:  27 seconds Radiation Exposure Index (if provided by the fluoroscopic device): 1.7324 Number of Acquired Spot Images: 1 COMPARISON:  None Available. FINDINGS: Single fluoroscopic spot view submitted. Right chest port with tip overlying the SVC. IMPRESSION: Procedural fluoroscopy during chest port insertion. Electronically Signed   By: Narda Rutherford M.D.   On: 09/13/2023 13:12   MM Breast Surgical Specimen Result Date: 09/13/2023 CLINICAL DATA:  Post lumpectomy specimen radiograph EXAM: SPECIMEN RADIOGRAPH OF THE RIGHT BREAST COMPARISON:  Previous exam(s). FINDINGS: Status post excision of the right breast. The three radioactive seed and three biopsy marker clips are present and completely intact. IMPRESSION: Specimen radiograph of the right breast. Electronically Signed   By: Emmaline Kluver M.D.   On: 09/13/2023 08:48   MM RT RADIO SEED EA ADD LESION LOC MAMMO Result Date: 09/11/2023 CLINICAL DATA:  78 year old female presenting for see locally she of the right breast. Patient has a newly diagnosed right breast cancer x3. EXAM: MAMMOGRAPHIC GUIDED RADIOACTIVE SEED LOCALIZATION OF THE RIGHT BREAST MAMMOGRAPHIC GUIDED RADIOACTIVE SEED LOCALIZATION OF THE RIGHT BREAST MAMMOGRAPHIC GUIDED RADIOACTIVE SEED LOCALIZATION OF THE RIGHT BREAST COMPARISON:  Previous exam(s). FINDINGS: Patient presents for radioactive seed localization prior to right breast lumpectomy. I met with the patient and we discussed the procedure of seed localization including benefits and alternatives. We discussed the high likelihood of a successful procedure. We discussed the risks of the procedure including infection, bleeding, tissue injury and further surgery. We discussed the low  dose of radioactivity involved in the procedure. Informed, written consent was given. The usual time-out protocol was performed immediately prior to the procedure. Using mammographic guidance, sterile technique, 1% lidocaine and an I-125 radioactive seed, the heart biopsy marking clip in the upper outer right breast was localized using a lateral approach. The follow-up mammogram images confirm the seed in the expected location and were marked for Dr. Dwain Sarna. Follow-up survey of the patient confirms presence of the radioactive seed. Order number of I-125 seed:  [id number]. Total activity: 0.254 mCi reference Date: 06/28/2023 Using mammographic guidance, sterile technique, 1% lidocaine and an I-125 radioactive seed, the ribbon biopsy marking clip in the upper outer right breast was localized using a lateral approach. The follow-up mammogram images confirm the seed in the expected location and were marked for Dr. Dwain Sarna. Follow-up survey of the patient confirms presence of the radioactive seed. Order number of I-125 seed:  [id number]. Total activity: 0.243 mCi reference Date: 08/15/2023 Using mammographic guidance, sterile technique, 1% lidocaine and an I-125 radioactive seed, the X biopsy marking clip in the right breast was localized using a lateral approach. The follow-up mammogram images confirm the seed in the expected location and were marked for Dr. Dwain Sarna. Follow-up survey of the patient confirms presence of the radioactive seed. Order number of I-125 seed:  [id number]. Total activity: 0.256 mCi reference Date: 07/16/2023 The patient tolerated the procedure well and was released from the Breast Center. She was given instructions regarding seed removal. IMPRESSION: Radioactive seed localization right breastx  3. No apparent complications. Electronically Signed   By: Emmaline Kluver M.D.   On: 09/11/2023 14:35   MM RT RADIOACTIVE SEED LOC MAMMO GUIDE Result Date: 09/11/2023 CLINICAL DATA:  78 year old  female presenting for see locally she of the right breast. Patient has a newly diagnosed right breast cancer x3. EXAM: MAMMOGRAPHIC GUIDED RADIOACTIVE SEED LOCALIZATION OF THE RIGHT BREAST MAMMOGRAPHIC GUIDED RADIOACTIVE SEED LOCALIZATION OF THE RIGHT BREAST MAMMOGRAPHIC GUIDED RADIOACTIVE SEED LOCALIZATION OF THE RIGHT BREAST COMPARISON:  Previous exam(s). FINDINGS: Patient presents for radioactive seed localization prior to right breast lumpectomy. I met with the patient and we discussed the procedure of seed localization including benefits and alternatives. We discussed the high likelihood of a successful procedure. We discussed the risks of the procedure including infection, bleeding, tissue injury and further surgery. We discussed the low dose of radioactivity involved in the procedure. Informed, written consent was given. The usual time-out protocol was performed immediately prior to the procedure. Using mammographic guidance, sterile technique, 1% lidocaine and an I-125 radioactive seed, the heart biopsy marking clip in the upper outer right breast was localized using a lateral approach. The follow-up mammogram images confirm the seed in the expected location and were marked for Dr. Dwain Sarna. Follow-up survey of the patient confirms presence of the radioactive seed. Order number of I-125 seed:  [id number]. Total activity: 0.254 mCi reference Date: 06/28/2023 Using mammographic guidance, sterile technique, 1% lidocaine and an I-125 radioactive seed, the ribbon biopsy marking clip in the upper outer right breast was localized using a lateral approach. The follow-up mammogram images confirm the seed in the expected location and were marked for Dr. Dwain Sarna. Follow-up survey of the patient confirms presence of the radioactive seed. Order number of I-125 seed:  [id number]. Total activity: 0.243 mCi reference Date: 08/15/2023 Using mammographic guidance, sterile technique, 1% lidocaine and an I-125 radioactive  seed, the X biopsy marking clip in the right breast was localized using a lateral approach. The follow-up mammogram images confirm the seed in the expected location and were marked for Dr. Dwain Sarna. Follow-up survey of the patient confirms presence of the radioactive seed. Order number of I-125 seed:  [id number]. Total activity: 0.256 mCi reference Date: 07/16/2023 The patient tolerated the procedure well and was released from the Breast Center. She was given instructions regarding seed removal. IMPRESSION: Radioactive seed localization right breastx 3. No apparent complications. Electronically Signed   By: Emmaline Kluver M.D.   On: 09/11/2023 14:35   MM RT RADIO SEED EA ADD LESION LOC MAMMO Result Date: 09/11/2023 CLINICAL DATA:  78 year old female presenting for see locally she of the right breast. Patient has a newly diagnosed right breast cancer x3. EXAM: MAMMOGRAPHIC GUIDED RADIOACTIVE SEED LOCALIZATION OF THE RIGHT BREAST MAMMOGRAPHIC GUIDED RADIOACTIVE SEED LOCALIZATION OF THE RIGHT BREAST MAMMOGRAPHIC GUIDED RADIOACTIVE SEED LOCALIZATION OF THE RIGHT BREAST COMPARISON:  Previous exam(s). FINDINGS: Patient presents for radioactive seed localization prior to right breast lumpectomy. I met with the patient and we discussed the procedure of seed localization including benefits and alternatives. We discussed the high likelihood of a successful procedure. We discussed the risks of the procedure including infection, bleeding, tissue injury and further surgery. We discussed the low dose of radioactivity involved in the procedure. Informed, written consent was given. The usual time-out protocol was performed immediately prior to the procedure. Using mammographic guidance, sterile technique, 1% lidocaine and an I-125 radioactive seed, the heart biopsy marking clip in the upper outer right breast was localized using a lateral approach.  The follow-up mammogram images confirm the seed in the expected location and  were marked for Dr. Dwain Sarna. Follow-up survey of the patient confirms presence of the radioactive seed. Order number of I-125 seed:  [id number]. Total activity: 0.254 mCi reference Date: 06/28/2023 Using mammographic guidance, sterile technique, 1% lidocaine and an I-125 radioactive seed, the ribbon biopsy marking clip in the upper outer right breast was localized using a lateral approach. The follow-up mammogram images confirm the seed in the expected location and were marked for Dr. Dwain Sarna. Follow-up survey of the patient confirms presence of the radioactive seed. Order number of I-125 seed:  [id number]. Total activity: 0.243 mCi reference Date: 08/15/2023 Using mammographic guidance, sterile technique, 1% lidocaine and an I-125 radioactive seed, the X biopsy marking clip in the right breast was localized using a lateral approach. The follow-up mammogram images confirm the seed in the expected location and were marked for Dr. Dwain Sarna. Follow-up survey of the patient confirms presence of the radioactive seed. Order number of I-125 seed:  [id number]. Total activity: 0.256 mCi reference Date: 07/16/2023 The patient tolerated the procedure well and was released from the Breast Center. She was given instructions regarding seed removal. IMPRESSION: Radioactive seed localization right breastx 3. No apparent complications. Electronically Signed   By: Emmaline Kluver M.D.   On: 09/11/2023 14:35    All questions were answered. The patient knows to call the clinic with any problems, questions or concerns. I spent 30 minutes in the care of this patient including H and P, review of records, counseling and coordination of care.     Rachel Moulds, MD 09/27/2023 4:05 PM

## 2023-09-27 NOTE — Assessment & Plan Note (Addendum)
 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  S/p lumpectomy with 1.8 cm triple neg breast cancer, neg margins for invasive carcinoma, LN neg. 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  Assessment & Plan Triple Negative Breast Cancer High recurrence risk post-surgery given triple neg BC - Administer chemotherapy starting Nov 06, 2023, with four cycles of TC every three weeks. - Monitor for side effects: fatigue, nausea, alopecia, constipation, neuropathy, immunosuppression. - Administer granulocyte colony-stimulating factor to prevent neutropenia. - Monitor hepatic and renal function during chemotherapy. - Attend chemotherapy education class.  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 Margin-clearing surgery required. - Perform margin-clearing surgery on October 08, 2023.  Radiation Therapy Planned post-chemotherapy   Follow-up  - Start chemotherapy on Nov 06, 2023, post-granddaughter's graduation.  Rachel Moulds MD

## 2023-09-27 NOTE — Progress Notes (Signed)
START ON PATHWAY REGIMEN - Breast     A cycle is every 21 days:     Cyclophosphamide      Docetaxel   **Always confirm dose/schedule in your pharmacy ordering system**  Patient Characteristics: Postoperative without Neoadjuvant Therapy, M0 (Pathologic Staging), Invasive Disease, Adjuvant Therapy, HER2 Negative, ER Negative, Node Negative, pT1a-c, N57mi or pT1c or Higher, pN0 Therapeutic Status: Postoperative without Neoadjuvant Therapy, M0 (Pathologic Staging) AJCC Grade: G3 AJCC N Category: pN0 AJCC M Category: cM0 ER Status: Negative (-) AJCC 8 Stage Grouping: IB HER2 Status: Negative (-) Oncotype Dx Recurrence Score: Not Appropriate AJCC T Category: pT1c PR Status: Negative (-) Intent of Therapy: Curative Intent, Discussed with Patient

## 2023-09-27 NOTE — Progress Notes (Signed)
 [facility]       Telephone: [phone]?Fax: [phone]   Oncology Clinical Pharmacist Practitioner Initial Assessment  Heather Lewis is a 78 y.o. female with a diagnosis of breast cancer. They were contacted today via in-person visit. She is accompanied by her daughter and husband.  Indication/Regimen Docetaxel (Taxotere) and cyclophosphamide (Cytoxan) are being used appropriately for treatment of breast cancer by Dr. Rachel Moulds.      Wt Readings from Last 1 Encounters:  09/27/23 117 lb 6.4 oz (53.3 kg)    Estimated body surface area is 1.53 meters squared as calculated from the following:   Height as of 09/13/23: 5\' 2"  (1.575 m).   Weight as of an earlier encounter on 09/27/23: 117 lb 6.4 oz (53.3 kg).  The dosing regimen cycle is every 21 days x 4 cycles  Docetaxel (75 mg/m2) on Day 1 Cyclophosphamide (600 mg/m2) on Day 1 Pegfilgrastim (6 mg) on Day 3  It is planned to continue until treatment plan completion or unacceptable toxicity. The tentative start date is: 11/06/23  Dose Modifications None at this time   Allergies Allergies  Allergen Reactions   Nitrates, Organic Other (See Comments)    MIGRAINES   Oxycodone Other (See Comments)    Hallucinations     Vitals    09/27/2023   11:21 AM 09/13/2023   10:23 AM 09/13/2023    9:45 AM  Oncology Vitals  Weight 53.252 kg    Weight (lbs) 117 lbs 6 oz    BMI 21.47 kg/m2    Temp 97.7 F (36.5 C) 98 F (36.7 C)   Pulse Rate 66 87 85  BP 129/57 133/60 130/61  Resp 17 16 18   SpO2 100 % 97 % 96 %  BSA (m2) 1.53 m2       Laboratory Data    Latest Ref Rng & Units 08/29/2023   12:23 PM  CBC EXTENDED  WBC 4.0 - 10.5 K/uL 6.3   RBC 3.87 - 5.11 MIL/uL 4.29   Hemoglobin 12.0 - 15.0 g/dL 52.8   HCT 41.3 - 24.4 % 38.5   Platelets 150 - 400 K/uL 258   NEUT# 1.7 - 7.7 K/uL 4.1   Lymph# 0.7 - 4.0 K/uL 1.7        Latest Ref Rng & Units 08/29/2023   12:23 PM  CMP  Glucose 70 - 99 mg/dL 94    BUN 8 - 23 mg/dL 16   Creatinine 0.10 - 1.00 mg/dL 2.72   Sodium 536 - 644 mmol/L 139   Potassium 3.5 - 5.1 mmol/L 4.4   Chloride 98 - 111 mmol/L 105   CO2 22 - 32 mmol/L 29   Calcium 8.9 - 10.3 mg/dL 8.9   Total Protein 6.5 - 8.1 g/dL 7.0   Total Bilirubin 0.0 - 1.2 mg/dL 0.3   Alkaline Phos 38 - 126 U/L 69   AST 15 - 41 U/L 18   ALT 0 - 44 U/L 13    Contraindications Contraindications were reviewed? Yes Contraindications to therapy were identified? No   Safety Precautions The following safety precautions were reviewed:  Fever: reviewed the importance of having a thermometer and the Centers for Disease Control and Prevention (CDC) definition of fever which is 100.45F (38C) or higher. Patient should call 24/7 triage at [phone] if experiencing a fever or any other symptoms Decreased white blood cells (WBCs) and increased risk for infection Decreased platelet count and increased risk of bleeding Decreased hemoglobin, part  of the red blood cells that carry iron and oxygen Hair Loss Fatigue Fluid retention or swelling (edema) Peripheral Neuropathy Mouth sores Rash or itchy skin Muscle or joint pain or weakness Nausea or vomiting Diarrhea Nail Changes Hypersensitivity reactions Secondary Malignancies Hemorrhagic cystitis Pneumonitis Handling body fluids and waste Intimacy, sexual activity, contraception, fertility  Medication Reconciliation Current Outpatient Medications  Medication Sig Dispense Refill   cholecalciferol (VITAMIN D) 1000 units tablet Take 1,000 Units by mouth daily.     Multiple Vitamins-Minerals (EYE VITAMINS PO) Take by mouth.     dexamethasone (DECADRON) 4 MG tablet Take 2 tabs by mouth 2 times daily starting day before chemo. Then take 2 tabs daily for 2 days starting day after chemo. Take with food. 30 tablet 1   docusate sodium (COLACE) 100 MG capsule Take 1 capsule (100 mg total) by mouth 2 (two) times daily. While taking narcotic pain  medicine. (Patient not taking: Reported on 09/27/2023) 30 capsule 0   ibuprofen (ADVIL,MOTRIN) 800 MG tablet Take 800 mg by mouth every 8 (eight) hours as needed. migraines  (Patient not taking: Reported on 09/27/2023)     lidocaine-prilocaine (EMLA) cream Apply to affected area once 30 g 3   ondansetron (ZOFRAN) 8 MG tablet Take 1 tablet (8 mg total) by mouth every 8 (eight) hours as needed for nausea or vomiting. Start on the third day after chemotherapy. 30 tablet 1   prochlorperazine (COMPAZINE) 10 MG tablet Take 1 tablet (10 mg total) by mouth every 6 (six) hours as needed for nausea or vomiting. 30 tablet 1   senna (SENOKOT) 8.6 MG TABS tablet Take 2 tablets (17.2 mg total) by mouth 2 (two) times daily. (Patient not taking: Reported on 09/27/2023) 30 each 0   No current facility-administered medications for this visit.   Medication reconciliation is based on the patient's most recent medication list in the electronic medical record (EMR) including herbal products and OTC medications.   The patient's medication list was reviewed today with the patient? Yes   Drug-drug interactions (DDIs) DDIs were evaluated? Yes Significant DDIs identified? No   Drug-Food Interactions Drug-food interactions were evaluated? Yes Drug-food interactions identified? No   Follow-up Plan  Treatment start date: 11/06/23 Port placement date: 09/13/23 We reviewed the prescriptions, premedications, and treatment regimen with the patient. Possible side effects of the treatment regimen were reviewed and management strategies were discussed.  Can use loperamide as needed for diarrhea, loratadine (Claritin) as needed for G-CSF bone pain, and Senna-S as needed for constipation.  [name] is considering DigniCap. She will let Dr. Al Pimple know so additional time can be added to infusion time if applicable As requested, here is the American Cancer Society article regarding antioxidants:  https://www.cancer.org/cancer/latest-news/study-finds-antioxidants-risky-during-breast-cancer-chemotherapy.html  Clinical pharmacy will assist Dr. Rachel Moulds and Farrel Conners on an as needed basis going forward  Farrel Conners participated in the discussion, expressed understanding, and voiced agreement with the above plan. All questions were answered to her satisfaction. The patient was advised to contact the clinic at [phone] with any questions or concerns prior to her return visit.   I spent 60 minutes assessing the patient.  Aissa Lisowski A. Odetta Pink, PharmD, BCOP, CPP  Anselm Lis, RPH-CPP, 09/27/2023 3:29 PM  **Disclaimer: This note was dictated with voice recognition software. Similar sounding words can inadvertently be transcribed and this note may contain transcription errors which may not have been corrected upon publication of note.**

## 2023-09-28 ENCOUNTER — Other Ambulatory Visit: Payer: Self-pay

## 2023-10-01 ENCOUNTER — Other Ambulatory Visit: Payer: Self-pay

## 2023-10-01 ENCOUNTER — Encounter: Payer: Self-pay | Admitting: *Deleted

## 2023-10-01 ENCOUNTER — Encounter (HOSPITAL_BASED_OUTPATIENT_CLINIC_OR_DEPARTMENT_OTHER): Payer: Self-pay | Admitting: General Surgery

## 2023-10-08 ENCOUNTER — Ambulatory Visit (HOSPITAL_BASED_OUTPATIENT_CLINIC_OR_DEPARTMENT_OTHER): Admitting: Anesthesiology

## 2023-10-08 ENCOUNTER — Other Ambulatory Visit: Payer: Self-pay

## 2023-10-08 ENCOUNTER — Encounter (HOSPITAL_BASED_OUTPATIENT_CLINIC_OR_DEPARTMENT_OTHER): Payer: Self-pay | Admitting: General Surgery

## 2023-10-08 ENCOUNTER — Encounter (HOSPITAL_BASED_OUTPATIENT_CLINIC_OR_DEPARTMENT_OTHER)
Admission: RE | Disposition: A | Discharge status: Home / Self Care | Payer: Self-pay | Source: Home / Self Care | Attending: General Surgery

## 2023-10-08 ENCOUNTER — Ambulatory Visit (HOSPITAL_BASED_OUTPATIENT_CLINIC_OR_DEPARTMENT_OTHER)
Admission: RE | Admit: 2023-10-08 | Discharge: 2023-10-08 | Disposition: A | Discharge status: Home / Self Care | Attending: General Surgery | Admitting: General Surgery

## 2023-10-08 DIAGNOSIS — Z01818 Encounter for other preprocedural examination: Secondary | ICD-10-CM

## 2023-10-08 DIAGNOSIS — Z171 Estrogen receptor negative status [ER-]: Secondary | ICD-10-CM | POA: Insufficient documentation

## 2023-10-08 DIAGNOSIS — R519 Headache, unspecified: Secondary | ICD-10-CM | POA: Diagnosis not present

## 2023-10-08 DIAGNOSIS — C50411 Malignant neoplasm of upper-outer quadrant of right female breast: Secondary | ICD-10-CM

## 2023-10-08 HISTORY — PX: BREAST LUMPECTOMY: SHX2

## 2023-10-08 SURGERY — BREAST LUMPECTOMY
Anesthesia: General | Site: Breast | Laterality: Right

## 2023-10-08 MED ORDER — CEFAZOLIN SODIUM-DEXTROSE 2-4 GM/100ML-% IV SOLN
2.0000 g | INTRAVENOUS | Status: AC
  Administered 2023-10-08: 2 g via INTRAVENOUS

## 2023-10-08 MED ORDER — ACETAMINOPHEN 500 MG PO TABS
1000.0000 mg | ORAL_TABLET | ORAL | Status: AC
Start: 2023-10-08 — End: 2023-10-08
  Administered 2023-10-08: 1000 mg via ORAL

## 2023-10-08 MED ORDER — BUPIVACAINE HCL (PF) 0.25 % IJ SOLN
INTRAMUSCULAR | Status: AC
  Filled 2023-10-08: qty 30

## 2023-10-08 MED ORDER — TRAMADOL HCL 50 MG PO TABS
ORAL_TABLET | ORAL | Status: AC
  Filled 2023-10-08: qty 1

## 2023-10-08 MED ORDER — LACTATED RINGERS IV SOLN: INTRAVENOUS | Status: DC

## 2023-10-08 MED ORDER — CHLORHEXIDINE GLUCONATE CLOTH 2 % EX PADS
6.0000 | MEDICATED_PAD | Freq: Once | CUTANEOUS | Status: AC
Start: 2023-10-08 — End: 2023-10-08
  Administered 2023-10-08: 6 via TOPICAL

## 2023-10-08 MED ORDER — ONDANSETRON HCL 4 MG/2ML IJ SOLN
INTRAMUSCULAR | Status: AC
  Filled 2023-10-08: qty 2

## 2023-10-08 MED ORDER — ACETAMINOPHEN 500 MG PO TABS
ORAL_TABLET | ORAL | Status: AC
  Filled 2023-10-08: qty 2

## 2023-10-08 MED ORDER — CHLORHEXIDINE GLUCONATE CLOTH 2 % EX PADS: 6.0000 | MEDICATED_PAD | Freq: Once | CUTANEOUS | Status: DC

## 2023-10-08 MED ORDER — ONDANSETRON HCL 4 MG/2ML IJ SOLN
INTRAMUSCULAR | Status: DC | PRN
Start: 2023-10-08 — End: 2023-10-08
  Administered 2023-10-08: 4 mg via INTRAVENOUS

## 2023-10-08 MED ORDER — DEXAMETHASONE SODIUM PHOSPHATE 4 MG/ML IJ SOLN
INTRAMUSCULAR | Status: DC | PRN
  Administered 2023-10-08: 5 mg via INTRAVENOUS

## 2023-10-08 MED ORDER — PROPOFOL 10 MG/ML IV BOLUS
INTRAVENOUS | Status: AC
  Filled 2023-10-08: qty 20

## 2023-10-08 MED ORDER — FENTANYL CITRATE (PF) 100 MCG/2ML IJ SOLN
25.0000 ug | INTRAMUSCULAR | Status: DC | PRN
Start: 2023-10-08 — End: 2023-10-08

## 2023-10-08 MED ORDER — FENTANYL CITRATE (PF) 100 MCG/2ML IJ SOLN
INTRAMUSCULAR | Status: AC
  Filled 2023-10-08: qty 2

## 2023-10-08 MED ORDER — PROPOFOL 10 MG/ML IV BOLUS
INTRAVENOUS | Status: DC | PRN
  Administered 2023-10-08: 100 mg via INTRAVENOUS

## 2023-10-08 MED ORDER — LIDOCAINE 2% (20 MG/ML) 5 ML SYRINGE
INTRAMUSCULAR | Status: AC
  Filled 2023-10-08: qty 5

## 2023-10-08 MED ORDER — AMISULPRIDE (ANTIEMETIC) 5 MG/2ML IV SOLN: 10.0000 mg | Freq: Once | INTRAVENOUS | Status: DC | PRN

## 2023-10-08 MED ORDER — FENTANYL CITRATE (PF) 100 MCG/2ML IJ SOLN
INTRAMUSCULAR | Status: DC | PRN
  Administered 2023-10-08: 50 ug via INTRAVENOUS

## 2023-10-08 MED ORDER — CEFAZOLIN SODIUM-DEXTROSE 2-4 GM/100ML-% IV SOLN
INTRAVENOUS | Status: AC
  Filled 2023-10-08: qty 100

## 2023-10-08 MED ORDER — LIDOCAINE HCL (CARDIAC) PF 100 MG/5ML IV SOSY
PREFILLED_SYRINGE | INTRAVENOUS | Status: DC | PRN
  Administered 2023-10-08: 40 mg via INTRAVENOUS

## 2023-10-08 MED ORDER — PROPOFOL 500 MG/50ML IV EMUL
INTRAVENOUS | Status: DC | PRN
  Administered 2023-10-08: 100 ug/kg/min via INTRAVENOUS

## 2023-10-08 MED ORDER — TRAMADOL HCL 50 MG PO TABS
25.0000 mg | ORAL_TABLET | Freq: Once | ORAL | Status: AC
  Administered 2023-10-08: 25 mg via ORAL

## 2023-10-08 MED ORDER — BUPIVACAINE HCL (PF) 0.25 % IJ SOLN
INTRAMUSCULAR | Status: DC | PRN
  Administered 2023-10-08: 10 mL

## 2023-10-08 MED ORDER — ENSURE PRE-SURGERY PO LIQD: 296.0000 mL | Freq: Once | ORAL | Status: DC

## 2023-10-08 SURGICAL SUPPLY — 46 items
BINDER BREAST LRG (GAUZE/BANDAGES/DRESSINGS) IMPLANT
BINDER BREAST MEDIUM (GAUZE/BANDAGES/DRESSINGS) IMPLANT
BINDER BREAST XLRG (GAUZE/BANDAGES/DRESSINGS) IMPLANT
BINDER BREAST XXLRG (GAUZE/BANDAGES/DRESSINGS) IMPLANT
BLADE SURG 15 STRL LF DISP TIS (BLADE) ×1 IMPLANT
CANISTER SUCT 1200ML W/VALVE (MISCELLANEOUS) IMPLANT
CHLORAPREP W/TINT 26 (MISCELLANEOUS) ×1 IMPLANT
CLIP APPLIE 9.375 MED OPEN (MISCELLANEOUS) IMPLANT
CLIP TI WIDE RED SMALL 6 (CLIP) IMPLANT
COVER BACK TABLE 60X90IN (DRAPES) ×1 IMPLANT
COVER MAYO STAND STRL (DRAPES) ×1 IMPLANT
DERMABOND ADVANCED .7 DNX12 (GAUZE/BANDAGES/DRESSINGS) IMPLANT
DRAPE LAPAROSCOPIC ABDOMINAL (DRAPES) ×1 IMPLANT
DRAPE UTILITY XL STRL (DRAPES) ×1 IMPLANT
DRSG TEGADERM 4X4.75 (GAUZE/BANDAGES/DRESSINGS) ×1 IMPLANT
ELECT COATED BLADE 2.86 ST (ELECTRODE) ×1 IMPLANT
ELECTRODE BLDE 4.0 EZ CLN MEGD (MISCELLANEOUS) IMPLANT
ELECTRODE REM PT RTRN 9FT ADLT (ELECTROSURGICAL) ×1 IMPLANT
GAUZE SPONGE 4X4 12PLY STRL LF (GAUZE/BANDAGES/DRESSINGS) ×1 IMPLANT
GLOVE BIO SURGEON STRL SZ7 (GLOVE) ×1 IMPLANT
GLOVE BIOGEL PI IND STRL 7.5 (GLOVE) ×1 IMPLANT
GOWN STRL REUS W/ TWL LRG LVL3 (GOWN DISPOSABLE) ×3 IMPLANT
HEMOSTAT ARISTA ABSORB 3G PWDR (HEMOSTASIS) IMPLANT
KIT MARKER MARGIN INK (KITS) ×1 IMPLANT
NDL HYPO 25X1 1.5 SAFETY (NEEDLE) ×1 IMPLANT
NEEDLE HYPO 25X1 1.5 SAFETY (NEEDLE) ×1 IMPLANT
NS IRRIG 1000ML POUR BTL (IV SOLUTION) IMPLANT
PACK BASIN DAY SURGERY FS (CUSTOM PROCEDURE TRAY) ×1 IMPLANT
PENCIL SMOKE EVACUATOR (MISCELLANEOUS) ×1 IMPLANT
RETRACTOR ONETRAX LX 90X20 (MISCELLANEOUS) IMPLANT
SLEEVE SCD COMPRESS KNEE MED (STOCKING) ×1 IMPLANT
SPIKE FLUID TRANSFER (MISCELLANEOUS) IMPLANT
SPONGE T-LAP 4X18 ~~LOC~~+RFID (SPONGE) ×1 IMPLANT
STRIP CLOSURE SKIN 1/2X4 (GAUZE/BANDAGES/DRESSINGS) IMPLANT
SUT MNCRL AB 4-0 PS2 18 (SUTURE) IMPLANT
SUT MON AB 5-0 PS2 18 (SUTURE) IMPLANT
SUT SILK 2 0 SH (SUTURE) ×1 IMPLANT
SUT VIC AB 2-0 SH 27XBRD (SUTURE) ×1 IMPLANT
SUT VIC AB 3-0 SH 27X BRD (SUTURE) ×1 IMPLANT
SUT VIC AB 5-0 PS2 18 (SUTURE) IMPLANT
SUT VICRYL AB 3 0 TIES (SUTURE) IMPLANT
SYR CONTROL 10ML LL (SYRINGE) ×1 IMPLANT
TOWEL GREEN STERILE FF (TOWEL DISPOSABLE) ×1 IMPLANT
TRAY FAXITRON CT DISP (TRAY / TRAY PROCEDURE) IMPLANT
TUBE CONNECTING 20X1/4 (TUBING) IMPLANT
YANKAUER SUCT BULB TIP NO VENT (SUCTIONS) IMPLANT

## 2023-10-08 NOTE — Anesthesia Preprocedure Evaluation (Addendum)
 Anesthesia Evaluation  Patient identified by MRN, date of birth, ID band Patient awake    Reviewed: Allergy & Precautions, NPO status , Patient's Chart, lab work & pertinent test results  History of Anesthesia Complications (+) history of anesthetic complications (hard to wake up from colonoscopy when propofol  nto used)  Airway Mallampati: III  TM Distance: >3 FB Neck ROM: Full    Dental no notable dental hx. (+) Teeth Intact, Dental Advisory Given   Pulmonary    Pulmonary exam normal breath sounds clear to auscultation       Cardiovascular Normal cardiovascular exam Rhythm:Regular Rate:Normal     Neuro/Psych  Headaches  negative psych ROS   GI/Hepatic   Endo/Other    Renal/GU   negative genitourinary   Musculoskeletal   Abdominal   Peds  Hematology Lab Results      Component                Value               Date                      WBC                      6.3                 08/29/2023                HGB                      12.6                08/29/2023                HCT                      38.5                08/29/2023                MCV                      89.7                08/29/2023                PLT                      258                 08/29/2023              Anesthesia Other Findings TMJ  Reproductive/Obstetrics Right breast cancer                             Anesthesia Physical Anesthesia Plan  ASA: 2  Anesthesia Plan: General   Post-op Pain Management: Tylenol  PO (pre-op)*   Induction: Intravenous  PONV Risk Score and Plan: 3 and Ondansetron , Dexamethasone , Propofol  infusion, TIVA, Midazolam  and Treatment may vary due to age or medical condition  Airway Management Planned: LMA  Additional Equipment:   Intra-op Plan:   Post-operative Plan: Extubation in OR  Informed Consent: I have reviewed the patients History and Physical, chart, labs and  discussed the procedure including the risks, benefits and alternatives for the proposed  anesthesia with the patient or authorized representative who has indicated his/her understanding and acceptance.     Dental advisory given  Plan Discussed with: CRNA and Anesthesiologist  Anesthesia Plan Comments: (Risks of general anesthesia discussed including, but not limited to, sore throat, hoarse voice, chipped/damaged teeth, injury to vocal cords, nausea and vomiting, allergic reactions, lung infection, heart attack, stroke, and death. All questions answered. )        Anesthesia Quick Evaluation

## 2023-10-08 NOTE — Anesthesia Postprocedure Evaluation (Signed)
 Anesthesia Post Note  Patient: Heather Lewis  Procedure(s) Performed: BREAST LUMPECTOMY (Right: Breast)     Patient location during evaluation: PACU Anesthesia Type: General Level of consciousness: awake and alert, oriented and patient cooperative Pain management: pain level controlled Vital Signs Assessment: post-procedure vital signs reviewed and stable Respiratory status: spontaneous breathing, nonlabored ventilation and respiratory function stable Cardiovascular status: blood pressure returned to baseline and stable Postop Assessment: no apparent nausea or vomiting Anesthetic complications: no   No notable events documented.  Last Vitals:  Vitals:   10/08/23 1200 10/08/23 1215  BP: 133/64   Pulse:  (!) 58  Resp: 15 13  Temp:    SpO2: 100% 100%    Last Pain:  Vitals:   10/08/23 1200  TempSrc:   PainSc: Asleep                 [name]

## 2023-10-08 NOTE — Discharge Instructions (Addendum)
 [facility] Office Phone Number [phone]  POST OP INSTRUCTIONS Take 400 mg of ibuprofen every 8 hours or 650 mg tylenol  every 6 hours for next 72 hours then as needed. Use ice several times daily also.  A prescription for pain medication may be given to you upon discharge.  Take your pain medication as prescribed, if needed.  If narcotic pain medicine is not needed, then you may take acetaminophen  (Tylenol ), naprosyn (Alleve) or ibuprofen (Advil) as needed. Take your usually prescribed medications unless otherwise directed If you need a refill on your pain medication, please contact your pharmacy.  They will contact our office to request authorization.  Prescriptions will not be filled after 5pm or on week-ends. You should eat very light the first 24 hours after surgery, such as soup, crackers, pudding, etc.  Resume your normal diet the day after surgery. Most patients will experience some swelling and bruising in the breast.  Ice packs and a good support bra will help.  Wear the breast binder provided or a sports bra for 72 hours day and night.  After that wear a sports bra during the day until you return to the office. Swelling and bruising can take several days to resolve.  It is common to experience some constipation if taking pain medication after surgery.  Increasing fluid intake and taking a stool softener will usually help or prevent this problem from occurring.  A mild laxative (Milk of Magnesia or Miralax) should be taken according to package directions if there are no bowel movements after 48 hours. I used skin glue on the incision, you may shower in 24 hours.  The glue will flake off over the next 2-3 weeks.  Any sutures or staples will be removed at the office during your follow-up visit. ACTIVITIES:  You may resume regular daily activities (gradually increasing) beginning the next day.  Wearing a good support bra or sports bra minimizes pain and swelling.  You may have  sexual intercourse when it is comfortable. You may drive when you no longer are taking prescription pain medication, you can comfortably wear a seatbelt, and you can safely maneuver your car and apply brakes. RETURN TO WORK:  ______________________________________________________________________________________ Heather Lewis should see your doctor in the office for a follow-up appointment approximately two weeks after your surgery.  Your doctor's nurse will typically make your follow-up appointment when she calls you with your pathology report.  Expect your pathology report 3-4 business days after your surgery.  You may call to check if you do not hear from us  after three days. OTHER INSTRUCTIONS: _______________________________________________________________________________________________ _____________________________________________________________________________________________________________________________________ _____________________________________________________________________________________________________________________________________ _____________________________________________________________________________________________________________________________________  WHEN TO CALL DR WAKEFIELD: Fever over 101.0 Nausea and/or vomiting. Extreme swelling or bruising. Continued bleeding from incision. Increased pain, redness, or drainage from the incision.  The clinic staff is available to answer your questions during regular business hours.  Please don't hesitate to call and ask to speak to one of the nurses for clinical concerns.  If you have a medical emergency, go to the nearest emergency room or call 911.  A surgeon from [facility] is always on call at the hospital.  For further questions, please visit [email or web address]  No Tylenol  before 4pm today.   Post Anesthesia Home Care Instructions  Activity: Get plenty of rest for the remainder of the day. A  responsible individual must stay with you for 24 hours following the procedure.  For the next 24 hours, DO NOT: -Drive a car -Advertising copywriter -Drink alcoholic beverages -  Take any medication unless instructed by your physician -Make any legal decisions or sign important papers.  Meals: Start with liquid foods such as gelatin or soup. Progress to regular foods as tolerated. Avoid greasy, spicy, heavy foods. If nausea and/or vomiting occur, drink only clear liquids until the nausea and/or vomiting subsides. Call your physician if vomiting continues.  Special Instructions/Symptoms: Your throat may feel dry or sore from the anesthesia or the breathing tube placed in your throat during surgery. If this causes discomfort, gargle with warm salt water. The discomfort should disappear within 24 hours.  If you had a scopolamine  patch placed behind your ear for the management of post- operative nausea and/or vomiting:  1. The medication in the patch is effective for 72 hours, after which it should be removed.  Wrap patch in a tissue and discard in the trash. Wash hands thoroughly with soap and water. 2. You may remove the patch earlier than 72 hours if you experience unpleasant side effects which may include dry mouth, dizziness or visual disturbances. 3. Avoid touching the patch. Wash your hands with soap and water after contact with the patch.     Received tramadol  25 mg today at 12:53 PM

## 2023-10-08 NOTE — Transfer of Care (Signed)
 Immediate Anesthesia Transfer of Care Note  Patient: Heather Lewis  Procedure(s) Performed: BREAST LUMPECTOMY (Right: Breast)  Patient Location: PACU  Anesthesia Type:General  Level of Consciousness: awake, alert , and oriented  Airway & Oxygen Therapy: Patient Spontanous Breathing and Patient connected to face mask oxygen  Post-op Assessment: Report given to RN and Post -op Vital signs reviewed and stable  Post vital signs: Reviewed and stable  Last Vitals:  Vitals Value Taken Time  BP 133/67 10/08/23 1157  Temp    Pulse 65 10/08/23 1157  Resp 18 10/08/23 1158  SpO2 100 % 10/08/23 1157  Vitals shown include unfiled device data.  Last Pain:  Vitals:   10/08/23 1003  TempSrc: Temporal  PainSc: 0-No pain      Patients Stated Pain Goal: 3 (10/08/23 1003)  Complications: No notable events documented.

## 2023-10-08 NOTE — Anesthesia Procedure Notes (Signed)
 Procedure Name: LMA Insertion Date/Time: 10/08/2023 11:13 AM  Performed by: Elvia Hammans, CRNAPre-anesthesia Checklist: Patient identified, Emergency Drugs available, Suction available and Patient being monitored Patient Re-evaluated:Patient Re-evaluated prior to induction Oxygen Delivery Method: Circle system utilized Preoxygenation: Pre-oxygenation with 100% oxygen Induction Type: IV induction Ventilation: Mask ventilation without difficulty LMA: LMA inserted LMA Size: 4.0 Number of attempts: 1 Placement Confirmation: positive ETCO2 Tube secured with: Tape Dental Injury: Teeth and Oropharynx as per pre-operative assessment

## 2023-10-08 NOTE — Interval H&P Note (Signed)
 History and Physical Interval Note:  10/08/2023 10:44 AM  Heather Lewis  has presented today for surgery, with the diagnosis of RIGHT BREAST CANCER.  The various methods of treatment have been discussed with the patient and family. After consideration of risks, benefits and other options for treatment, the patient has consented to  Procedure(s) with comments: BREAST LUMPECTOMY (Right) - RIGHT BREAST MARGIN RE-EXCISION as a surgical intervention.  The patient's history has been reviewed, patient examined, no change in status, stable for surgery.  I have reviewed the patient's chart and labs.  Questions were answered to the patient's satisfaction.     Enid Harry

## 2023-10-08 NOTE — Op Note (Signed)
 Preoperative diagnosis: Positive margins status post right lumpectomy Postoperative diagnosis: Same as above Procedure: Reexcision right breast lumpectomy Surgeon: Dr. Donavan Fuchs Anesthesia: General Estimated blood loss: Minimal Complications: None Drains: None Specimens: Right breast medial margin marked short superior, long lateral, double deep Complications: None Sponge and needle count was correct at completion Disposition to recovery stable condition  Indications: This a 78 year old female is undergone a lumpectomy, sentinel node biopsy as well as port placement.  On the medial margin that I took on the initial lumpectomy she had some DCIS at the posterior aspect of that.  We discussed going to the operating room to clear this margin.  Procedure: After informed consent was obtained she was taken to the operating room.  She was given antibiotics.  SCDs were in place.  She was placed under general anesthesia without complication.  She was prepped and draped in a standard sterile surgical fashion.  A surgical timeout was then performed.  I infiltrated Marcaine  throughout the right lateral breast.  I then reopened my breast incision.  I released all the sutures that I had close the cavity down.  I then identified the medial margin.  The posterior margin was already the muscle.  I removed additional medial margin.  I marked this as above.  This was passed off the table.  Hemostasis was observed.  I then closed the breast tissue again with 2-0 Vicryl.  The skin was closed with 3-0 Vicryl and 4-0 Monocryl.  Glue and Steri-Strips were applied.  She tolerated this well was extubated and transferred recovery stable.

## 2023-10-09 ENCOUNTER — Encounter (HOSPITAL_BASED_OUTPATIENT_CLINIC_OR_DEPARTMENT_OTHER): Payer: Self-pay | Admitting: General Surgery

## 2023-10-09 LAB — SURGICAL PATHOLOGY

## 2023-10-12 LAB — SURGICAL PATHOLOGY

## 2023-10-16 ENCOUNTER — Other Ambulatory Visit: Payer: Self-pay | Admitting: General Surgery

## 2023-10-17 ENCOUNTER — Encounter: Payer: Self-pay | Admitting: Hematology and Oncology

## 2023-10-17 ENCOUNTER — Encounter: Payer: Self-pay | Admitting: *Deleted

## 2023-10-17 ENCOUNTER — Other Ambulatory Visit: Payer: Self-pay | Admitting: Pharmacist

## 2023-10-19 ENCOUNTER — Encounter: Payer: Self-pay | Admitting: *Deleted

## 2023-10-22 NOTE — Therapy (Signed)
 OUTPATIENT PHYSICAL THERAPY BREAST CANCER POST OP FOLLOW UP   Patient Name: Heather Lewis MRN: [id number] DOB:[birth year], 78 y.o., female Today's Date: 10/23/2023  END OF SESSION:  PT End of Session - 10/23/23 0940     Visit Number 2    Number of Visits 2    Date for PT Re-Evaluation 10/25/23    PT Start Time 0900    PT Stop Time 0941    PT Time Calculation (min) 41 min    Activity Tolerance Patient tolerated treatment well    Behavior During Therapy [facility] for tasks assessed/performed             Past Medical History:  Diagnosis Date   Age-related macular degeneration    Bunion of right foot 08/2017   Cancer (HCC) 08/2023   right breast ICD with DCIS   Complication of anesthesia    hard to wake up from colonoscopy when Propofol  not used   Dental crowns present    Metatarsalgia of right foot 08/2017   2nd toe   Migraines    with change of weather   TMJ syndrome    Past Surgical History:  Procedure Laterality Date   APPENDECTOMY  1960   AXILLARY SENTINEL NODE BIOPSY Right 09/13/2023   Procedure: BIOPSY, LYMPH NODE, SENTINEL, AXILLARY;  Surgeon: Enid Harry, MD;  Location: [facility];  Service: General;  Laterality: Right;  right   BREAST BIOPSY Right 08/23/2023   US  RT BREAST BX W LOC DEV 1ST LESION IMG BX SPEC US  GUIDE 08/23/2023 [facility]   BREAST BIOPSY Right 08/23/2023   US  RT BREAST BX W LOC DEV EA ADD LESION IMG BX SPEC US  GUIDE 08/23/2023 [facility]   BREAST BIOPSY Right 08/23/2023   MM RT BREAST BX W LOC DEV 1ST LESION IMAGE BX SPEC STEREO GUIDE 08/23/2023 [facility]   BREAST BIOPSY  09/11/2023   MM RT RADIOACTIVE SEED EA ADD LESION LOC MAMMO GUIDE 09/11/2023 [facility]   BREAST BIOPSY  09/11/2023   MM RT RADIOACTIVE SEED EA ADD LESION LOC MAMMO GUIDE 09/11/2023 [facility]   BREAST BIOPSY  09/11/2023   MM RT RADIOACTIVE SEED LOC MAMMO GUIDE 09/11/2023 [facility]   BREAST LUMPECTOMY Right 10/08/2023    Procedure: BREAST LUMPECTOMY;  Surgeon: Enid Harry, MD;  Location: [facility];  Service: General;  Laterality: Right;  RIGHT BREAST MARGIN RE-EXCISION   BREAST LUMPECTOMY WITH RADIOACTIVE SEED LOCALIZATION Right 09/13/2023   Procedure: BREAST LUMPECTOMY WITH RADIOACTIVE SEED LOCALIZATION;  Surgeon: Enid Harry, MD;  Location: [facility];  Service: General;  Laterality: Right;  Right breast radioactive seed bracketed lumpectomy   BUNIONECTOMY WITH WEIL OSTEOTOMY Right 09/06/2017   Procedure: Right Lapidus, Modified Phyllis Breeze;  Surgeon: Amada Backer, MD;  Location: [facility];  Service: Orthopedics;  Laterality: Right;   COLONOSCOPY WITH PROPOFOL   08/08/2016   HAMMERTOE RECONSTRUCTION WITH WEIL OSTEOTOMY  06/27/2018   Procedure: LEFT SECOND HAMMERTOE RECONSTRUCTION WITH WEIL OSTEOTOMY;  Surgeon: Amada Backer, MD;  Location: [facility];  Service: Orthopedics;;   PORTACATH PLACEMENT Right 09/13/2023   Procedure: INSERTION, TUNNELED CENTRAL VENOUS DEVICE, WITH PORT;  Surgeon: Enid Harry, MD;  Location: [facility];  Service: General;  Laterality: Right;  GEN w/PEC BLOCK   TARSAL METATARSAL ARTHRODESIS Left 06/27/2018   Procedure: Left modified McBride and lapidus 1st tarsometatarsal arthrodesis;  Surgeon: Amada Backer, MD;  Location: [facility];  Service: Orthopedics;  Laterality:  Left;    TONSILLECTOMY AND ADENOIDECTOMY  1954   Patient Active Problem List   Diagnosis Date Noted   Genetic testing 09/25/2023   Malignant neoplasm of upper-outer quadrant of right breast in female, estrogen receptor negative (HCC) 08/28/2023    REFERRING PROVIDER: Dr. Murleen Arms  REFERRING DIAG: Rt breast cancer  THERAPY DIAG:  Malignant neoplasm of upper-outer quadrant of right breast in female, estrogen receptor negative [facility])  Aftercare following surgery for neoplasm  At risk for  lymphedema  Rationale for Evaluation and Treatment: Rehabilitation  ONSET DATE: 08/23/23  SUBJECTIVE:                                                                                                                                                                                           SUBJECTIVE STATEMENT: I am doing very.  I am even pulling weeds.  I am still wearing my compression bra.   PERTINENT HISTORY:  Rt lumpectomy with SLNB 09/13/23 with 3 negative nodes removed. Then re-excision 10/08/23 with more positive margins so pt will have a 2nd re-excision on 11/06/23.  Then will have chemotherapy and radiation. Hx: Patient was diagnosed on 08/23/2023 with right grade 3 invasive ductal carcinoma breast cancer. It measured 1.9 cm and was located in the upper outer quadrant. It is triple negative with a Ki67 of 30%. She has a history of a left femur fracture with surgery 02/03/2023.   PATIENT GOALS:  Reassess how my recovery is going related to arm function, pain, and swelling.  PAIN:  Are you having pain? Yes: NPRS scale: 0-1 Pain location: Rt tricep region Pain description: burning Aggravating factors: doing more Relieving factors: nothing needed   PRECAUTIONS: Recent Surgery, left UE Lymphedema risk  RED FLAGS: None   ACTIVITY LEVEL / LEISURE: I am back to walking per day    OBJECTIVE:   PATIENT SURVEYS:  QUICK DASH: 18% from 0%   OBSERVATIONS: Steristrips still present   POSTURE:  Forward head posture   LYMPHEDEMA ASSESSMENT:   UPPER EXTREMITY AROM/PROM:   A/PROM RIGHT   eval   10/23/23  Shoulder extension 45 50  Shoulder flexion 136 140  Shoulder abduction 159 165  Shoulder internal rotation 63 70  Shoulder external rotation 72 85                          (Blank rows = not tested)   A/PROM LEFT   eval  Shoulder extension 38  Shoulder flexion 132  Shoulder abduction 153  Shoulder internal rotation 57  Shoulder external rotation 80                           (  Blank rows = not tested)   CERVICAL AROM: All within normal limits   UPPER EXTREMITY STRENGTH: WNL   LYMPHEDEMA ASSESSMENTS (in cm):    LANDMARK RIGHT   eval 10/23/23  10 cm proximal to olecranon process 24 25  Olecranon process 21.2 23.5  10 cm proximal to ulnar styloid process 18.8 18  Just proximal to ulnar styloid process 13.5 14  Across hand at thumb web space 17.5 17  At base of 2nd digit 6 6  (Blank rows = not tested)   LANDMARK LEFT   eval  10 cm proximal to olecranon process 24.1  Olecranon process 21.5  10 cm proximal to ulnar styloid process 17.7  Just proximal to ulnar styloid process 13  Across hand at thumb web space 17.8  At base of 2nd digit 5.7  (Blank rows = not tested)  PATIENT EDUCATION:  Education details: per post op information section below Person educated: Patient Education method: Chief Technology Officer Education comprehension: verbalized understanding  HOME EXERCISE PROGRAM: Reviewed previously given post op HEP.   ASSESSMENT:  CLINICAL IMPRESSION: Pt is doing very well after her lumpectomy and SLNB with re-excision.  She actually has another excision coming up at the end of may.  Discussed bras, lymphedema risk and risk reduction, and continuing exercises and walking through chemo and radiation.   Pt will benefit from skilled therapeutic intervention to improve on the following deficits: Decreased knowledge of precautions, impaired UE functional use, pain, decreased ROM, postural dysfunction.   PT treatment/interventions: ADL/Self care home management, [phone]- Self Care   GOALS: Goals reviewed with patient? Yes  LONG TERM GOALS:  (STG=LTG)  GOALS Name Target Date  Goal status  1 Pt will demonstrate she has regained full shoulder ROM and function post operatively compared to baselines.  Baseline: 10/23/23 MET                    PLAN:  PT FREQUENCY/DURATION: SOZO  PLAN FOR NEXT SESSION: SOZO   [facility]  [address]  [place] [address]  [phone]  After Breast Cancer Class Video It is recommended you view the ABC class video to be educated on lymphedema risk reduction. This video lasts for about 30 minutes. It can be viewed on our website here: [email or web address]  Scar massage You can begin gentle scar massage to you incision sites. Gently place one hand on the incision and move the skin (without sliding on the skin) in various directions. Do this for a few minutes and then you can gently massage either coconut oil or vitamin E cream into the scars.  Compression garment You should continue wearing your compression bra until you feel like you no longer have swelling.  Home exercise Program Continue doing the exercises you were given until you feel like you can do them without feeling any tightness at the end.   Walking Program Studies show that 30 minutes of walking per day (fast enough to elevate your heart rate) can significantly reduce the risk of a cancer recurrence. If you can't walk due to other medical reasons, we encourage you to find another activity you could do (like a stationary bike or water exercise).  Posture After breast cancer surgery, people frequently sit with rounded shoulders posture because it puts their incisions on slack and feels better. If you sit like this and scar tissue forms in that position, you can become very tight and have pain sitting or standing with  good posture. Try to be aware of your posture and sit and stand up tall to heal properly.  Follow up PT: It is recommended you return every 3 months for the first 3 years following surgery to be assessed on the SOZO machine for an L-Dex score. This helps prevent clinically significant lymphedema in 95% of patients. These follow up screens are 10 minute appointments that you are not billed for.  Encarnacion Harris,  PT 10/23/2023, 9:41 AM  PHYSICAL THERAPY DISCHARGE SUMMARY  Visits from Start of Care: 2  Current functional level related to goals / functional outcomes: See above   Remaining deficits: Lymphedema risk    Education / Equipment: Final HEP  Plan: Patient agrees to discharge.   Patient is being discharged due to meeting the stated rehab goals.

## 2023-10-23 ENCOUNTER — Encounter: Payer: Self-pay | Admitting: Rehabilitation

## 2023-10-23 ENCOUNTER — Ambulatory Visit: Attending: Hematology and Oncology | Admitting: Rehabilitation

## 2023-10-23 DIAGNOSIS — Z9189 Other specified personal risk factors, not elsewhere classified: Secondary | ICD-10-CM | POA: Diagnosis present

## 2023-10-23 DIAGNOSIS — Z171 Estrogen receptor negative status [ER-]: Secondary | ICD-10-CM | POA: Diagnosis present

## 2023-10-23 DIAGNOSIS — C50411 Malignant neoplasm of upper-outer quadrant of right female breast: Secondary | ICD-10-CM | POA: Diagnosis present

## 2023-10-23 DIAGNOSIS — Z483 Aftercare following surgery for neoplasm: Secondary | ICD-10-CM | POA: Insufficient documentation

## 2023-10-25 ENCOUNTER — Encounter: Payer: Self-pay | Admitting: *Deleted

## 2023-10-30 ENCOUNTER — Encounter (HOSPITAL_BASED_OUTPATIENT_CLINIC_OR_DEPARTMENT_OTHER): Payer: Self-pay | Admitting: General Surgery

## 2023-10-30 ENCOUNTER — Encounter: Payer: Self-pay | Admitting: *Deleted

## 2023-10-30 ENCOUNTER — Other Ambulatory Visit: Payer: Self-pay

## 2023-11-06 ENCOUNTER — Ambulatory Visit (HOSPITAL_BASED_OUTPATIENT_CLINIC_OR_DEPARTMENT_OTHER)
Admission: RE | Admit: 2023-11-06 | Discharge: 2023-11-06 | Disposition: A | Discharge status: Home / Self Care | Attending: General Surgery | Admitting: General Surgery

## 2023-11-06 ENCOUNTER — Ambulatory Visit (HOSPITAL_BASED_OUTPATIENT_CLINIC_OR_DEPARTMENT_OTHER): Admitting: Anesthesiology

## 2023-11-06 ENCOUNTER — Encounter (HOSPITAL_BASED_OUTPATIENT_CLINIC_OR_DEPARTMENT_OTHER): Payer: Self-pay | Admitting: General Surgery

## 2023-11-06 ENCOUNTER — Telehealth: Payer: Self-pay

## 2023-11-06 ENCOUNTER — Encounter (HOSPITAL_BASED_OUTPATIENT_CLINIC_OR_DEPARTMENT_OTHER)
Admission: RE | Disposition: A | Discharge status: Home / Self Care | Payer: Self-pay | Source: Home / Self Care | Attending: General Surgery

## 2023-11-06 ENCOUNTER — Ambulatory Visit

## 2023-11-06 ENCOUNTER — Ambulatory Visit: Admitting: Hematology and Oncology

## 2023-11-06 ENCOUNTER — Other Ambulatory Visit: Payer: Self-pay

## 2023-11-06 ENCOUNTER — Other Ambulatory Visit

## 2023-11-06 DIAGNOSIS — C50411 Malignant neoplasm of upper-outer quadrant of right female breast: Secondary | ICD-10-CM

## 2023-11-06 DIAGNOSIS — Z01818 Encounter for other preprocedural examination: Secondary | ICD-10-CM

## 2023-11-06 DIAGNOSIS — Z171 Estrogen receptor negative status [ER-]: Secondary | ICD-10-CM | POA: Insufficient documentation

## 2023-11-06 HISTORY — PX: BREAST LUMPECTOMY: SHX2

## 2023-11-06 SURGERY — BREAST LUMPECTOMY
Anesthesia: General | Site: Breast | Laterality: Right

## 2023-11-06 MED ORDER — ONDANSETRON HCL 4 MG/2ML IJ SOLN: 4.0000 mg | Freq: Once | INTRAMUSCULAR | Status: DC | PRN

## 2023-11-06 MED ORDER — ONDANSETRON HCL 4 MG/2ML IJ SOLN
INTRAMUSCULAR | Status: AC
  Filled 2023-11-06: qty 2

## 2023-11-06 MED ORDER — CHLORHEXIDINE GLUCONATE CLOTH 2 % EX PADS: 6.0000 | MEDICATED_PAD | Freq: Once | CUTANEOUS | Status: DC

## 2023-11-06 MED ORDER — ENSURE PRE-SURGERY PO LIQD: 296.0000 mL | Freq: Once | ORAL | Status: DC

## 2023-11-06 MED ORDER — BUPIVACAINE HCL (PF) 0.25 % IJ SOLN
INTRAMUSCULAR | Status: AC
  Filled 2023-11-06: qty 30

## 2023-11-06 MED ORDER — 0.9 % SODIUM CHLORIDE (POUR BTL) OPTIME
TOPICAL | Status: DC | PRN
Start: 2023-11-06 — End: 2023-11-06
  Administered 2023-11-06: 1000 mL

## 2023-11-06 MED ORDER — PHENYLEPHRINE 80 MCG/ML (10ML) SYRINGE FOR IV PUSH (FOR BLOOD PRESSURE SUPPORT)
PREFILLED_SYRINGE | INTRAVENOUS | Status: AC
  Filled 2023-11-06: qty 10

## 2023-11-06 MED ORDER — FENTANYL CITRATE (PF) 100 MCG/2ML IJ SOLN
INTRAMUSCULAR | Status: DC | PRN
  Administered 2023-11-06: 25 ug via INTRAVENOUS

## 2023-11-06 MED ORDER — LIDOCAINE HCL (CARDIAC) PF 100 MG/5ML IV SOSY
PREFILLED_SYRINGE | INTRAVENOUS | Status: DC | PRN
  Administered 2023-11-06: 60 mg via INTRAVENOUS

## 2023-11-06 MED ORDER — CEFAZOLIN SODIUM-DEXTROSE 2-4 GM/100ML-% IV SOLN
INTRAVENOUS | Status: AC
Start: 2023-11-06 — End: ?
  Filled 2023-11-06: qty 100

## 2023-11-06 MED ORDER — LIDOCAINE HCL (PF) 1 % IJ SOLN
INTRAMUSCULAR | Status: AC
  Filled 2023-11-06: qty 30

## 2023-11-06 MED ORDER — DEXAMETHASONE SODIUM PHOSPHATE 10 MG/ML IJ SOLN
INTRAMUSCULAR | Status: AC
  Filled 2023-11-06: qty 1

## 2023-11-06 MED ORDER — PROPOFOL 10 MG/ML IV BOLUS
INTRAVENOUS | Status: DC | PRN
  Administered 2023-11-06: 150 mg via INTRAVENOUS

## 2023-11-06 MED ORDER — LIDOCAINE 2% (20 MG/ML) 5 ML SYRINGE
INTRAMUSCULAR | Status: AC
  Filled 2023-11-06: qty 5

## 2023-11-06 MED ORDER — PROPOFOL 500 MG/50ML IV EMUL
INTRAVENOUS | Status: DC | PRN
Start: 2023-11-06 — End: 2023-11-06
  Administered 2023-11-06: 150 ug/kg/min via INTRAVENOUS

## 2023-11-06 MED ORDER — PHENYLEPHRINE HCL (PRESSORS) 10 MG/ML IV SOLN
INTRAVENOUS | Status: DC | PRN
Start: 2023-11-06 — End: 2023-11-06
  Administered 2023-11-06: 80 ug via INTRAVENOUS

## 2023-11-06 MED ORDER — ACETAMINOPHEN 500 MG PO TABS
ORAL_TABLET | ORAL | Status: AC
  Filled 2023-11-06: qty 2

## 2023-11-06 MED ORDER — FENTANYL CITRATE (PF) 100 MCG/2ML IJ SOLN: 25.0000 ug | INTRAMUSCULAR | Status: DC | PRN

## 2023-11-06 MED ORDER — DEXAMETHASONE SODIUM PHOSPHATE 10 MG/ML IJ SOLN
INTRAMUSCULAR | Status: DC | PRN
  Administered 2023-11-06: 5 mg via INTRAVENOUS

## 2023-11-06 MED ORDER — AMISULPRIDE (ANTIEMETIC) 5 MG/2ML IV SOLN: 10.0000 mg | Freq: Once | INTRAVENOUS | Status: DC | PRN

## 2023-11-06 MED ORDER — LACTATED RINGERS IV SOLN: INTRAVENOUS | Status: DC

## 2023-11-06 MED ORDER — ACETAMINOPHEN 500 MG PO TABS
1000.0000 mg | ORAL_TABLET | ORAL | Status: AC
Start: 2023-11-06 — End: 2023-11-06
  Administered 2023-11-06: 1000 mg via ORAL

## 2023-11-06 MED ORDER — BUPIVACAINE HCL (PF) 0.25 % IJ SOLN
INTRAMUSCULAR | Status: DC | PRN
  Administered 2023-11-06: 10 mL

## 2023-11-06 MED ORDER — CEFAZOLIN SODIUM-DEXTROSE 2-4 GM/100ML-% IV SOLN: 2.0000 g | INTRAVENOUS | Status: DC

## 2023-11-06 MED ORDER — FENTANYL CITRATE (PF) 100 MCG/2ML IJ SOLN
INTRAMUSCULAR | Status: AC
Start: 2023-11-06 — End: ?
  Filled 2023-11-06: qty 2

## 2023-11-06 MED ORDER — ONDANSETRON HCL 4 MG/2ML IJ SOLN
INTRAMUSCULAR | Status: DC | PRN
  Administered 2023-11-06: 4 mg via INTRAVENOUS

## 2023-11-06 SURGICAL SUPPLY — 47 items
BINDER BREAST LRG (GAUZE/BANDAGES/DRESSINGS) IMPLANT
BINDER BREAST MEDIUM (GAUZE/BANDAGES/DRESSINGS) IMPLANT
BINDER BREAST XLRG (GAUZE/BANDAGES/DRESSINGS) IMPLANT
BINDER BREAST XXLRG (GAUZE/BANDAGES/DRESSINGS) IMPLANT
BLADE SURG 15 STRL LF DISP TIS (BLADE) ×1 IMPLANT
CANISTER SUCT 1200ML W/VALVE (MISCELLANEOUS) IMPLANT
CHLORAPREP W/TINT 26 (MISCELLANEOUS) ×1 IMPLANT
CLIP APPLIE 9.375 MED OPEN (MISCELLANEOUS) IMPLANT
CLIP TI WIDE RED SMALL 6 (CLIP) IMPLANT
CLSR STERI-STRIP ANTIMIC 1/2X4 (GAUZE/BANDAGES/DRESSINGS) IMPLANT
COVER BACK TABLE 60X90IN (DRAPES) ×1 IMPLANT
COVER MAYO STAND STRL (DRAPES) ×1 IMPLANT
DERMABOND ADVANCED .7 DNX12 (GAUZE/BANDAGES/DRESSINGS) IMPLANT
DRAPE LAPAROSCOPIC ABDOMINAL (DRAPES) ×1 IMPLANT
DRAPE UTILITY XL STRL (DRAPES) ×1 IMPLANT
DRSG TEGADERM 4X4.75 (GAUZE/BANDAGES/DRESSINGS) ×1 IMPLANT
ELECT COATED BLADE 2.86 ST (ELECTRODE) ×1 IMPLANT
ELECTRODE BLDE 4.0 EZ CLN MEGD (MISCELLANEOUS) IMPLANT
ELECTRODE REM PT RTRN 9FT ADLT (ELECTROSURGICAL) ×1 IMPLANT
GAUZE SPONGE 4X4 12PLY STRL LF (GAUZE/BANDAGES/DRESSINGS) ×1 IMPLANT
GLOVE BIO SURGEON STRL SZ7 (GLOVE) ×1 IMPLANT
GLOVE BIOGEL PI IND STRL 7.5 (GLOVE) ×1 IMPLANT
GOWN STRL REUS W/ TWL LRG LVL3 (GOWN DISPOSABLE) ×3 IMPLANT
HEMOSTAT ARISTA ABSORB 3G PWDR (HEMOSTASIS) IMPLANT
KIT MARKER MARGIN INK (KITS) ×1 IMPLANT
NDL HYPO 25X1 1.5 SAFETY (NEEDLE) ×1 IMPLANT
NEEDLE HYPO 25X1 1.5 SAFETY (NEEDLE) ×1 IMPLANT
NS IRRIG 1000ML POUR BTL (IV SOLUTION) IMPLANT
PACK BASIN DAY SURGERY FS (CUSTOM PROCEDURE TRAY) ×1 IMPLANT
PENCIL SMOKE EVACUATOR (MISCELLANEOUS) ×1 IMPLANT
RETRACTOR ONETRAX LX 90X20 (MISCELLANEOUS) IMPLANT
SLEEVE SCD COMPRESS KNEE MED (STOCKING) ×1 IMPLANT
SPIKE FLUID TRANSFER (MISCELLANEOUS) IMPLANT
SPONGE T-LAP 4X18 ~~LOC~~+RFID (SPONGE) ×1 IMPLANT
STRIP CLOSURE SKIN 1/2X4 (GAUZE/BANDAGES/DRESSINGS) IMPLANT
SUT MNCRL AB 4-0 PS2 18 (SUTURE) IMPLANT
SUT MON AB 5-0 PS2 18 (SUTURE) IMPLANT
SUT SILK 2 0 SH (SUTURE) ×1 IMPLANT
SUT VIC AB 2-0 SH 27XBRD (SUTURE) ×1 IMPLANT
SUT VIC AB 3-0 SH 27X BRD (SUTURE) ×1 IMPLANT
SUT VIC AB 5-0 PS2 18 (SUTURE) IMPLANT
SUT VICRYL AB 3 0 TIES (SUTURE) IMPLANT
SYR CONTROL 10ML LL (SYRINGE) ×1 IMPLANT
TOWEL GREEN STERILE FF (TOWEL DISPOSABLE) ×1 IMPLANT
TRAY FAXITRON CT DISP (TRAY / TRAY PROCEDURE) IMPLANT
TUBE CONNECTING 20X1/4 (TUBING) IMPLANT
YANKAUER SUCT BULB TIP NO VENT (SUCTIONS) IMPLANT

## 2023-11-06 NOTE — Telephone Encounter (Signed)
 Called patient. Verbally confirmed that she will not be coming to her appointment on 5/21 due to recent surgery.

## 2023-11-06 NOTE — Interval H&P Note (Signed)
 History and Physical Interval Note:  11/06/2023 11:58 AM  Heather Lewis  has presented today for surgery, with the diagnosis of RIGHT BREAST CANCER.  The various methods of treatment have been discussed with the patient and family. After consideration of risks, benefits and other options for treatment, the patient has consented to  Procedure(s): RIGHT BREAST RE-EXCISION LUMPECTOMY (Right) as a surgical intervention.  The patient's history has been reviewed, patient examined, no change in status, stable for surgery.  I have reviewed the patient's chart and labs.  Questions were answered to the patient's satisfaction.     Heather Lewis

## 2023-11-06 NOTE — Op Note (Signed)
  Preoperative diagnosis: Positive margins status post right lumpectomy  Postoperative diagnosis: Same as above Procedure: Reexcision right breast lumpectomy Surgeon: Dr. Donavan Fuchs Anesthesia: General Estimated blood loss: Minimal Complications: None Drains: None Specimens: Right breast medial margin marked short superior, long lateral, double deep Also a final medial margin marked same way Complications: None Sponge and needle count was correct at completion Disposition to recovery stable condition   Indications: This a 78 year old female is undergone a lumpectomy, sentinel node biopsy as well as port placement.  On the medial margin that I took on the initial lumpectomy she had some DCIS at the posterior aspect of that.  We discussed going to the operating room to clear this margin again as she has had another attempt at clearance.    Procedure: After informed consent was obtained she was taken to the operating room.  She was given antibiotics.  SCDs were in place.  She was placed under general anesthesia without complication.  She was prepped and draped in a standard sterile surgical fashion.  A surgical timeout was then performed.   I infiltrated Marcaine  throughout the right lateral breast.  I then reopened my breast incision.  I released all the sutures that I had close the cavity down.  I then identified the medial margin.  The posterior margin was already the muscle.  I removed additional medial margin.  I marked this as above.  I removed additional piece as well. This was passed off the table.  Hemostasis was observed.  I then closed the breast tissue again with 2-0 Vicryl.  The skin was closed with 3-0 Vicryl and 4-0 Monocryl.  Glue and Steri-Strips were applied.  She tolerated this well was extubated and transferred recovery stable.

## 2023-11-06 NOTE — Anesthesia Procedure Notes (Signed)
 Procedure Name: LMA Insertion Date/Time: 11/06/2023 12:13 PM  Performed by: Leverne Reading, CRNAPre-anesthesia Checklist: Patient identified, Emergency Drugs available, Suction available and Patient being monitored Patient Re-evaluated:Patient Re-evaluated prior to induction Oxygen Delivery Method: Circle system utilized Preoxygenation: Pre-oxygenation with 100% oxygen Induction Type: IV induction LMA: LMA inserted LMA Size: 4.0 Number of attempts: 1 Placement Confirmation: positive ETCO2 Tube secured with: Tape Dental Injury: Teeth and Oropharynx as per pre-operative assessment

## 2023-11-06 NOTE — Anesthesia Preprocedure Evaluation (Addendum)
 Anesthesia Evaluation  Patient identified by MRN, date of birth, ID band Patient awake    Reviewed: Allergy & Precautions, NPO status , Patient's Chart, lab work & pertinent test results  Airway Mallampati: II  TM Distance: >3 FB Neck ROM: Full    Dental no notable dental hx.    Pulmonary neg pulmonary ROS   Pulmonary exam normal        Cardiovascular negative cardio ROS Normal cardiovascular exam     Neuro/Psych  Headaches  negative psych ROS   GI/Hepatic negative GI ROS, Neg liver ROS,,,  Endo/Other  negative endocrine ROS    Renal/GU negative Renal ROS     Musculoskeletal negative musculoskeletal ROS (+)    Abdominal   Peds  Hematology negative hematology ROS (+)   Anesthesia Other Findings RIGHT BREAST CANCER  Reproductive/Obstetrics                             Anesthesia Physical Anesthesia Plan  ASA: 2  Anesthesia Plan: General   Post-op Pain Management:    Induction: Intravenous  PONV Risk Score and Plan: 3 and Ondansetron , Dexamethasone , Propofol  infusion and Treatment may vary due to age or medical condition  Airway Management Planned: LMA  Additional Equipment:   Intra-op Plan:   Post-operative Plan: Extubation in OR  Informed Consent: I have reviewed the patients History and Physical, chart, labs and discussed the procedure including the risks, benefits and alternatives for the proposed anesthesia with the patient or authorized representative who has indicated his/her understanding and acceptance.     Dental advisory given  Plan Discussed with: CRNA  Anesthesia Plan Comments:        Anesthesia Quick Evaluation

## 2023-11-06 NOTE — Discharge Instructions (Addendum)
 [facility] Office Phone Number [phone]  POST OP INSTRUCTIONS Take 400 mg of ibuprofen every 8 hours or 650 mg tylenol  every 6 hours for next 72 hours then as needed. Use ice several times daily also.  A prescription for pain medication may be given to you upon discharge.  Take your pain medication as prescribed, if needed.  If narcotic pain medicine is not needed, then you may take acetaminophen  (Tylenol ), naprosyn (Alleve) or ibuprofen (Advil) as needed. Take your usually prescribed medications unless otherwise directed If you need a refill on your pain medication, please contact your pharmacy.  They will contact our office to request authorization.  Prescriptions will not be filled after 5pm or on week-ends. You should eat very light the first 24 hours after surgery, such as soup, crackers, pudding, etc.  Resume your normal diet the day after surgery. Most patients will experience some swelling and bruising in the breast.  Ice packs and a good support bra will help.  Wear the breast binder provided or a sports bra for 72 hours day and night.  After that wear a sports bra during the day until you return to the office. Swelling and bruising can take several days to resolve.  It is common to experience some constipation if taking pain medication after surgery.  Increasing fluid intake and taking a stool softener will usually help or prevent this problem from occurring.  A mild laxative (Milk of Magnesia or Miralax) should be taken according to package directions if there are no bowel movements after 48 hours. I used skin glue on the incision, you may shower in 24 hours.  The glue will flake off over the next 2-3 weeks.  Any sutures or staples will be removed at the office during your follow-up visit. ACTIVITIES:  You may resume regular daily activities (gradually increasing) beginning the next day.  Wearing a good support bra or sports bra minimizes pain and swelling.  You may have  sexual intercourse when it is comfortable. You may drive when you no longer are taking prescription pain medication, you can comfortably wear a seatbelt, and you can safely maneuver your car and apply brakes. RETURN TO WORK:  ______________________________________________________________________________________ Heather Lewis should see your doctor in the office for a follow-up appointment approximately two weeks after your surgery.  Your doctor's nurse will typically make your follow-up appointment when she calls you with your pathology report.  Expect your pathology report 3-4 business days after your surgery.  You may call to check if you do not hear from us  after three days. OTHER INSTRUCTIONS: _______________________________________________________________________________________________ _____________________________________________________________________________________________________________________________________ _____________________________________________________________________________________________________________________________________ _____________________________________________________________________________________________________________________________________  WHEN TO CALL DR WAKEFIELD: Fever over 101.0 Nausea and/or vomiting. Extreme swelling or bruising. Continued bleeding from incision. Increased pain, redness, or drainage from the incision.  The clinic staff is available to answer your questions during regular business hours.  Please don't hesitate to call and ask to speak to one of the nurses for clinical concerns.  If you have a medical emergency, go to the nearest emergency room or call 911.  A surgeon from [facility] is always on call at the hospital.  For further questions, please visit [email or web address]  No Tylenol  before 5pm today.   Post Anesthesia Home Care Instructions  Activity: Get plenty of rest for the remainder of the day. A  responsible individual must stay with you for 24 hours following the procedure.  For the next 24 hours, DO NOT: -Drive a car -Advertising copywriter -Drink alcoholic beverages -  Take any medication unless instructed by your physician -Make any legal decisions or sign important papers.  Meals: Start with liquid foods such as gelatin or soup. Progress to regular foods as tolerated. Avoid greasy, spicy, heavy foods. If nausea and/or vomiting occur, drink only clear liquids until the nausea and/or vomiting subsides. Call your physician if vomiting continues.  Special Instructions/Symptoms: Your throat may feel dry or sore from the anesthesia or the breathing tube placed in your throat during surgery. If this causes discomfort, gargle with warm salt water. The discomfort should disappear within 24 hours.  If you had a scopolamine  patch placed behind your ear for the management of post- operative nausea and/or vomiting:  1. The medication in the patch is effective for 72 hours, after which it should be removed.  Wrap patch in a tissue and discard in the trash. Wash hands thoroughly with soap and water. 2. You may remove the patch earlier than 72 hours if you experience unpleasant side effects which may include dry mouth, dizziness or visual disturbances. 3. Avoid touching the patch. Wash your hands with soap and water after contact with the patch.

## 2023-11-06 NOTE — H&P (Signed)
  77yof with right breast mass noted on exam. She has b density breasts. She has an outer right breast 3 mm calcs, mass at 10 o clock that measures 1.8x1.5x0.8 cm with an adjacent 9x8x8 mm mass. Ax us  is negative. All 3 biopsies positive with farthest clips being 4 cm away. 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 She has undergone lump/sn/ port and then subsequent margin re-excision with continued pos margin.   Review of Systems: A complete review of systems was obtained from the patient. I have reviewed this information and discussed as appropriate with the patient. See HPI as well for other ROS.  Review of Systems  All other systems reviewed and are negative.  Medical History: Past Medical History:  Diagnosis Date  Anxiety  Arthritis  History of cancer   Patient Active Problem List  Diagnosis  Malignant neoplasm of upper-outer quadrant of right breast in female, estrogen receptor negative (CMS/HHS-HCC)   Past Surgical History:  Procedure Laterality Date  Tarsal metatarsal arthrodesis Left 06/27/2018  .Right Breast Biopsy Right 08/23/2023  APPENDECTOMY   Allergies  Allergen Reactions  Nitrate Analogues Other (See Comments)  MIGRAINES  Oxycodone  Hallucination   Current Outpatient Medications on File Prior to Visit  Medication Sig Dispense Refill  cholecalciferol (VITAMIN D3) 1000 unit tablet Take 1,000 Units by mouth once daily  docusate (COLACE) 100 MG capsule Take 100 mg by mouth once daily  ibuprofen (MOTRIN) 800 MG tablet Take 800 mg by mouth every 8 (eight) hours as needed for Pain   Family History  Problem Relation Age of Onset  Lung cancer Mother  Skin cancer Father  Colon cancer Brother    Social History   Tobacco Use  Smoking Status Never  Smokeless Tobacco Never  Marital status: Married  Tobacco Use  Smoking status: Never  Smokeless tobacco: Never  Vaping Use  Vaping status: Never Used  Substance and Sexual Activity   Alcohol  use: Not Currently  Drug use: Never   Objective:   Physical Exam Vitals reviewed.  Constitutional:  Appearance: Normal appearance.  Chest: clear Breasts: Right: No inverted nipple, mass or nipple discharge.  Left: No inverted nipple, mass or nipple discharge.  Lymphadenopathy:  Upper Body:  Right upper body: No supraclavicular or axillary adenopathy.  Left upper body: No supraclavicular or axillary adenopathy.  Neurological:  Mental Status: She is alert.    Assessment and Plan:   Re-excision of margin again  Discussed one more attempt at [facility]. If fails will need mastectomy

## 2023-11-06 NOTE — Transfer of Care (Signed)
 Immediate Anesthesia Transfer of Care Note  Patient: Heather Lewis  Procedure(s) Performed: RIGHT BREAST RE-EXCISION LUMPECTOMY (Right: Breast)  Patient Location: PACU  Anesthesia Type:General  Level of Consciousness: drowsy, patient cooperative, and responds to stimulation  Airway & Oxygen Therapy: Patient Spontanous Breathing and Patient connected to face mask oxygen  Post-op Assessment: Report given to RN and Post -op Vital signs reviewed and stable  Post vital signs: Reviewed and stable  Last Vitals:  Vitals Value Taken Time  BP 138/64 11/06/23 1300  Temp 36.5 C 11/06/23 1250  Pulse 62 11/06/23 1307  Resp 18 11/06/23 1307  SpO2 98 % 11/06/23 1307  Vitals shown include unfiled device data.  Last Pain:  Vitals:   11/06/23 1300  TempSrc:   PainSc: 0-No pain         Complications: No notable events documented.

## 2023-11-07 ENCOUNTER — Inpatient Hospital Stay: Admitting: Hematology and Oncology

## 2023-11-07 ENCOUNTER — Encounter (HOSPITAL_BASED_OUTPATIENT_CLINIC_OR_DEPARTMENT_OTHER): Payer: Self-pay | Admitting: General Surgery

## 2023-11-07 LAB — SURGICAL PATHOLOGY

## 2023-11-07 NOTE — Anesthesia Postprocedure Evaluation (Signed)
 Anesthesia Post Note  Patient: Heather Lewis  Procedure(s) Performed: RIGHT BREAST RE-EXCISION LUMPECTOMY (Right: Breast)     Patient location during evaluation: PACU Anesthesia Type: General Level of consciousness: awake Pain management: pain level controlled Vital Signs Assessment: post-procedure vital signs reviewed and stable Respiratory status: spontaneous breathing, nonlabored ventilation and respiratory function stable Cardiovascular status: blood pressure returned to baseline and stable Postop Assessment: no apparent nausea or vomiting Anesthetic complications: no   No notable events documented.  Last Vitals:  Vitals:   11/06/23 1300 11/06/23 1329  BP: 138/64 (!) 149/82  Pulse: 63 63  Resp: 17 18  Temp:  (!) 36.3 C  SpO2: 99% 97%    Last Pain:  Vitals:   11/06/23 1329  TempSrc: Temporal  PainSc: 0-No pain                 Xiomar Crompton P Rosalynn Sergent

## 2023-11-08 ENCOUNTER — Ambulatory Visit

## 2023-11-09 ENCOUNTER — Encounter: Payer: Self-pay | Admitting: *Deleted

## 2023-11-15 ENCOUNTER — Encounter: Payer: Self-pay | Admitting: Hematology and Oncology

## 2023-11-16 ENCOUNTER — Telehealth: Payer: Self-pay

## 2023-11-16 NOTE — Telephone Encounter (Signed)
 Verbally confirmed appt for 6/2

## 2023-11-19 ENCOUNTER — Encounter: Payer: Self-pay | Admitting: *Deleted

## 2023-11-19 ENCOUNTER — Inpatient Hospital Stay: Attending: Hematology and Oncology | Admitting: Hematology and Oncology

## 2023-11-19 VITALS — BP 132/64 | HR 70 | Temp 99.0°F | Resp 18 | Wt 117.7 lb

## 2023-11-19 DIAGNOSIS — Z1732 Human epidermal growth factor receptor 2 negative status: Secondary | ICD-10-CM | POA: Insufficient documentation

## 2023-11-19 DIAGNOSIS — Z171 Estrogen receptor negative status [ER-]: Secondary | ICD-10-CM | POA: Diagnosis not present

## 2023-11-19 DIAGNOSIS — Z5111 Encounter for antineoplastic chemotherapy: Secondary | ICD-10-CM | POA: Diagnosis present

## 2023-11-19 DIAGNOSIS — Z5189 Encounter for other specified aftercare: Secondary | ICD-10-CM | POA: Insufficient documentation

## 2023-11-19 DIAGNOSIS — Z1722 Progesterone receptor negative status: Secondary | ICD-10-CM | POA: Diagnosis not present

## 2023-11-19 DIAGNOSIS — C50411 Malignant neoplasm of upper-outer quadrant of right female breast: Secondary | ICD-10-CM | POA: Diagnosis not present

## 2023-11-19 NOTE — Addendum Note (Signed)
 Addended by: Kito Cuffe, NAGA VENKATA KALIPRAVEENA on: 11/19/2023 09:55 AM   Modules accepted: Orders

## 2023-11-19 NOTE — Progress Notes (Signed)
 Pharmacist Chemotherapy Monitoring - Initial Assessment    Anticipated start date: 11/26/23   The following has been reviewed per standard work regarding the patient's treatment regimen: The patient's diagnosis, treatment plan and drug doses, and organ/hematologic function Lab orders and baseline tests specific to treatment regimen  The treatment plan start date, drug sequencing, and pre-medications Prior authorization status  Patient's documented medication list, including drug-drug interaction screen and prescriptions for anti-emetics and supportive care specific to the treatment regimen The drug concentrations, fluid compatibility, administration routes, and timing of the medications to be used The patient's access for treatment and lifetime cumulative dose history, if applicable  The patient's medication allergies and previous infusion related reactions, if applicable   Changes made to treatment plan:  N/A  Follow up needed:  N/A   Cherylynn Cosier, RPH, 11/19/2023  1:16 PM

## 2023-11-19 NOTE — Progress Notes (Signed)
 [facility] CONSULT NOTE  Patient Care Team: Abbe Hoard., MD as PCP - General (Internal Medicine) Auther Bo, RN as Oncology Nurse Navigator Alane Hsu, RN as Oncology Nurse Navigator Murleen Arms, MD as Consulting Physician (Hematology and Oncology) Enid Harry, MD as Consulting Physician (General Surgery) Retta Caster, MD as Consulting Physician (Radiation Oncology)  CHIEF COMPLAINTS/PURPOSE OF CONSULTATION:  Right breast cancer.  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  S/p lumpectomy with 1.8 cm triple neg breast cancer, neg margins for invasive carcinoma, LN neg.  Assessment and Plan Assessment & Plan Triple negative breast cancer Triple negative breast cancer, post-surgery with benign margins.  - will need adj TC every 21 days times 4 cycles.  Concerns about chemotherapy strength and side effects addressed. Declined cold cap. Port in place for administration. - Schedule first chemotherapy session for next Monday. - Administer GCSF 24 hours post-chemotherapy session. - Monitor for side effects and adjust treatment as necessary. - Ensure insurance authorization for chemotherapy drugs. - RTC before C2 of TC.     No orders of the defined types were placed in this encounter.    HISTORY OF PRESENTING ILLNESS:  Heather Lewis 78 y.o. female is here because of newly diagnosed breast cancer  Oncology History  Malignant neoplasm of upper-outer quadrant of right breast in female, estrogen receptor negative (HCC)  08/28/2023 Initial Diagnosis   Malignant neoplasm of upper-outer quadrant of right breast in female,  estrogen receptor negative (HCC)    Mammogram   Highly suspicious 1.8 cm palpable mass right breast 10 o'clock position. Highly suspicious 0.9 cm mass noted adjacent to the dominant palpable mass at the 10 o'clock position. The distance between the 2 masses measures less than 1 cm. Indeterminate grouped calcifications in the central outer right breast at posterior depth.    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  Report date is 09/12/2023.   The CancerNext-Expanded gene panel offered by [facility] and includes sequencing, rearrangement, and RNA analysis for the following 76 genes: AIP, ALK, APC, ATM, AXIN2, BAP1, BARD1, BMPR1A, BRCA1, BRCA2, BRIP1, CDC73, CDH1, CDK4, CDKN1B, CDKN2A, CEBPA, CHEK2, CTNNA1, DDX41, DICER1, ETV6, FH, FLCN, GATA2, LZTR1, MAX, MBD4, MEN1, MET, MLH1, MSH2, MSH3, MSH6, MUTYH, NF1, NF2, NTHL1, PALB2, PHOX2B, PMS2, POT1, PRKAR1A, PTCH1, PTEN, RAD51C, RAD51D, RB1, RET, RUNX1, SDHA, SDHAF2, SDHB, SDHC, SDHD, SMAD4, SMARCA4, SMARCB1, SMARCE1, STK11, SUFU, TMEM127, TP53, TSC1, TSC2, VHL, and WT1 (sequencing and deletion/duplication); EGFR, HOXB13, KIT, MITF, PDGFRA, POLD1, and POLE (sequencing only); EPCAM and GREM1 (deletion/duplication only).    09/13/2023 Definitive Surgery   Right breast lumpectomy, foci of IDC discontinuously involving a fibrotic area of 1.8 cms, grade 3, extensive cancerization of lobules, DCIS high grade, neg margins,  Rt axillary  sentinel LN negative. Additional medial margin ( deep  resection margin pos for DCIS)   11/26/2023 -  Chemotherapy   Patient is on Treatment Plan : BREAST TC q21d      Discussed the use of AI scribe software for clinical note transcription with the patient, who gave verbal consent to proceed.  History of Present Illness   The patient is a 78 year old with breast cancer who presents for follow up.  Heather Lewis is a 78 year old female with triple negative breast cancer who presents for chemotherapy initiation.  She has undergone three surgeries for her breast cancer, with the most recent surgery revealing benign final margins. She experiences significant fatigue, which she attributes to her recent surgeries and anesthesia. Despite this, she is eager to maintain her active lifestyle, stating that she dislikes being inactive. Family and friends have advised her to take it easy due to her recent medical history.  She wears a compression bra post-surgery, which she finds uncomfortable but necessary to prevent seromas. Steri-strips are still in place.  She has concerns about hair loss due to chemotherapy and has opted not to use a cold cap after attending a class where it was not recommended. She has already purchased a wig in preparation for potential hair loss.  She is hoping to take some iron supplements since she is anemic.   MEDICAL HISTORY:  Past Medical History:  Diagnosis Date   Age-related macular degeneration    Bunion of right foot 08/2017   Cancer (HCC) 08/2023   right breast ICD with DCIS   Complication of anesthesia    hard to wake up from colonoscopy when Propofol  not used   Dental crowns present    Metatarsalgia of right foot 08/2017   2nd toe   Migraines    with change of weather   TMJ syndrome     SURGICAL HISTORY: Past Surgical History:  Procedure Laterality Date   APPENDECTOMY  1960   AXILLARY SENTINEL NODE BIOPSY Right 09/13/2023   Procedure: BIOPSY, LYMPH NODE, SENTINEL, AXILLARY;  Surgeon: Enid Harry,  MD;  Location: [facility];  Service: General;  Laterality: Right;  right   BREAST BIOPSY Right 08/23/2023   US  RT BREAST BX W LOC DEV 1ST LESION IMG BX SPEC US  GUIDE 08/23/2023 [facility]   BREAST BIOPSY Right 08/23/2023   US  RT BREAST BX W LOC DEV EA ADD LESION IMG BX SPEC US  GUIDE 08/23/2023 [facility]   BREAST BIOPSY Right 08/23/2023   MM RT BREAST BX W LOC DEV 1ST LESION IMAGE BX SPEC STEREO GUIDE 08/23/2023 [facility]   BREAST BIOPSY  09/11/2023   MM RT RADIOACTIVE SEED EA ADD LESION LOC MAMMO GUIDE 09/11/2023 [facility]   BREAST BIOPSY  09/11/2023   MM RT RADIOACTIVE SEED EA ADD LESION LOC MAMMO GUIDE 09/11/2023 [facility]   BREAST BIOPSY  09/11/2023   MM RT RADIOACTIVE SEED LOC MAMMO GUIDE 09/11/2023 [facility]   BREAST LUMPECTOMY Right 10/08/2023   Procedure: BREAST LUMPECTOMY;  Surgeon: Enid Harry, MD;  Location: [facility];  Service: General;  Laterality: Right;  RIGHT BREAST MARGIN RE-EXCISION   BREAST LUMPECTOMY Right 11/06/2023   Procedure: RIGHT BREAST RE-EXCISION LUMPECTOMY;  Surgeon: Enid Harry, MD;  Location: [facility];  Service: General;  Laterality: Right;   BREAST LUMPECTOMY WITH RADIOACTIVE SEED LOCALIZATION Right 09/13/2023   Procedure: BREAST LUMPECTOMY WITH RADIOACTIVE SEED LOCALIZATION;  Surgeon: Enid Harry, MD;  Location:  [facility];  Service: General;  Laterality: Right;  Right breast radioactive seed bracketed lumpectomy   BUNIONECTOMY WITH WEIL OSTEOTOMY Right 09/06/2017   Procedure: Right Lapidus, Modified Phyllis Breeze;  Surgeon: Amada Backer, MD;  Location: [facility];  Service: Orthopedics;  Laterality: Right;   COLONOSCOPY WITH PROPOFOL   08/08/2016   HAMMERTOE RECONSTRUCTION WITH WEIL OSTEOTOMY  06/27/2018   Procedure: LEFT SECOND HAMMERTOE RECONSTRUCTION WITH WEIL OSTEOTOMY;  Surgeon: Amada Backer, MD;  Location: [facility];  Service: Orthopedics;;   PORTACATH PLACEMENT Right 09/13/2023   Procedure: INSERTION, TUNNELED CENTRAL VENOUS DEVICE, WITH PORT;  Surgeon: Enid Harry, MD;  Location: [facility];  Service: General;  Laterality: Right;  GEN w/PEC BLOCK   TARSAL METATARSAL ARTHRODESIS Left 06/27/2018   Procedure: Left modified McBride and lapidus 1st tarsometatarsal arthrodesis;  Surgeon: Amada Backer, MD;  Location: [facility];  Service: Orthopedics;  Laterality: Left;    TONSILLECTOMY AND ADENOIDECTOMY  1954    SOCIAL HISTORY: Social History   Socioeconomic History   Marital status: Married    Spouse name: Not on file   Number of children: Not on file   Years of education: Not on file   Highest education level: Not on file  Occupational History   Not on file  Tobacco Use   Smoking status: Never   Smokeless tobacco: Never  Vaping Use   Vaping status: Never Used  Substance and Sexual Activity   Alcohol  use: Not Currently   Drug use: No   Sexual activity: Not Currently    Birth control/protection: Post-menopausal  Other Topics Concern   Not on file  Social History Narrative   Not on file   Social Drivers of Health   Financial Resource Strain: Not on file  Food Insecurity: Low Risk  (11/12/2023)   Received from [facility]   Hunger Vital Sign    Worried About Running Out of Food in the Last Year: Never true    Ran Out of Food in the Last Year: Never true  Transportation Needs: No Transportation Needs (11/12/2023)   Received from [facility]    In the past 12 months, has lack of reliable transportation kept you from medical appointments, meetings, work or from getting things needed for daily living? : No  Physical Activity: Not on file  Stress: Not on file  Social Connections: Not on file  Intimate Partner Violence: Not At Risk (08/29/2023)   Humiliation, Afraid, Rape, and Kick questionnaire    Fear of Current or  Ex-Partner: No    Emotionally Abused: No    Physically Abused: No    Sexually Abused: No    FAMILY HISTORY: Family History  Problem Relation Age of Onset   Colon polyps Mother    Lung cancer Mother        d. 100   Colon polyps Father    Melanoma Father    Colon cancer Brother        and polyps; dx 46s   Lung cancer Maternal Aunt    Lung cancer Maternal Grandfather    Breast cancer Niece        dx 73s    ALLERGIES:  is allergic to nitrates, organic and oxycodone .  MEDICATIONS:  Current Outpatient Medications  Medication Sig Dispense Refill   cholecalciferol (VITAMIN D) 1000 units tablet Take 1,000 Units by mouth daily.     dexamethasone  (DECADRON ) 4 MG tablet Take 2 tabs  by mouth 2 times daily starting day before chemo. Then take 2 tabs daily for 2 days starting day after chemo. Take with food. 30 tablet 1   docusate sodium  (COLACE) 100 MG capsule Take 1 capsule (100 mg total) by mouth 2 (two) times daily. While taking narcotic pain medicine. 30 capsule 0   ibuprofen (ADVIL,MOTRIN) 800 MG tablet Take 800 mg by mouth every 8 (eight) hours as needed. migraines     lidocaine -prilocaine  (EMLA ) cream Apply to affected area once 30 g 3   Multiple Vitamins-Minerals (EYE VITAMINS PO) Take by mouth.     ondansetron  (ZOFRAN ) 8 MG tablet Take 1 tablet (8 mg total) by mouth every 8 (eight) hours as needed for nausea or vomiting. Start on the third day after chemotherapy. 30 tablet 1   prochlorperazine  (COMPAZINE ) 10 MG tablet Take 1 tablet (10 mg total) by mouth every 6 (six) hours as needed for nausea or vomiting. 30 tablet 1   senna (SENOKOT) 8.6 MG TABS tablet Take 2 tablets (17.2 mg total) by mouth 2 (two) times daily. 30 each 0   No current facility-administered medications for this visit.     PHYSICAL EXAMINATION: ECOG PERFORMANCE STATUS: 0 - Asymptomatic  Vitals:   11/19/23 0857  BP: 132/64  Pulse: 70  Resp: 18  Temp: 99 F (37.2 C)  SpO2: 100%     Filed Weights    11/19/23 0857  Weight: 117 lb 11.2 oz (53.4 kg)   GENERAL:alert, no distress and comfortable    LABORATORY DATA:  I have reviewed the data as listed Lab Results  Component Value Date   WBC 6.3 08/29/2023   HGB 12.6 08/29/2023   HCT 38.5 08/29/2023   MCV 89.7 08/29/2023   PLT 258 08/29/2023     Chemistry      Component Value Date/Time   NA 139 08/29/2023 1223   K 4.4 08/29/2023 1223   CL 105 08/29/2023 1223   CO2 29 08/29/2023 1223   BUN 16 08/29/2023 1223   CREATININE 0.77 08/29/2023 1223      Component Value Date/Time   CALCIUM 8.9 08/29/2023 1223   ALKPHOS 69 08/29/2023 1223   AST 18 08/29/2023 1223   ALT 13 08/29/2023 1223   BILITOT 0.3 08/29/2023 1223       RADIOGRAPHIC STUDIES: I have personally reviewed the radiological images as listed and agreed with the findings in the report. No results found.   All questions were answered. The patient knows to call the clinic with any problems, questions or concerns. I spent 30 minutes in the care of this patient including H and P, review of records, counseling and coordination of care.     Murleen Arms, MD 11/19/2023 9:52 AM

## 2023-11-19 NOTE — Assessment & Plan Note (Addendum)
 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  S/p lumpectomy with 1.8 cm triple neg breast cancer, neg margins for invasive carcinoma, LN neg.  Assessment and Plan Assessment & Plan Triple negative breast cancer Triple negative breast cancer, post-surgery with benign margins.  - will need adj TC every 21 days times 4 cycles.  Concerns about chemotherapy strength and side effects addressed. Declined cold cap. Port in place for administration. - Schedule first chemotherapy session for next Monday. - Administer GCSF 24 hours post-chemotherapy session. - Monitor for side effects and adjust treatment as necessary. - Ensure insurance authorization for chemotherapy drugs. - RTC before C2 of TC.

## 2023-11-20 ENCOUNTER — Ambulatory Visit

## 2023-11-26 ENCOUNTER — Inpatient Hospital Stay

## 2023-11-26 VITALS — BP 147/69 | HR 62 | Temp 97.8°F | Resp 16

## 2023-11-26 DIAGNOSIS — C50411 Malignant neoplasm of upper-outer quadrant of right female breast: Secondary | ICD-10-CM

## 2023-11-26 DIAGNOSIS — Z95828 Presence of other vascular implants and grafts: Secondary | ICD-10-CM | POA: Insufficient documentation

## 2023-11-26 DIAGNOSIS — Z5111 Encounter for antineoplastic chemotherapy: Secondary | ICD-10-CM | POA: Diagnosis not present

## 2023-11-26 LAB — CMP (CANCER CENTER ONLY)
ALT: 11 U/L (ref 0–44)
AST: 15 U/L (ref 15–41)
Albumin: 4.2 g/dL (ref 3.5–5.0)
Alkaline Phosphatase: 66 U/L (ref 38–126)
Anion gap: 7 (ref 5–15)
BUN: 15 mg/dL (ref 8–23)
CO2: 24 mmol/L (ref 22–32)
Calcium: 9.2 mg/dL (ref 8.9–10.3)
Chloride: 107 mmol/L (ref 98–111)
Creatinine: 0.69 mg/dL (ref 0.44–1.00)
GFR, Estimated: >60 mL/min (ref >60)
Glucose, Bld: 114 mg/dL — ABNORMAL HIGH (ref 70–99)
Potassium: 4 mmol/L (ref 3.5–5.1)
Sodium: 138 mmol/L (ref 135–145)
Total Bilirubin: 0.4 mg/dL (ref 0.0–1.2)
Total Protein: 6.7 g/dL (ref 6.5–8.1)

## 2023-11-26 LAB — CBC WITH DIFFERENTIAL (CANCER CENTER ONLY)
Abs Immature Granulocytes: 0.04 10*3/uL (ref 0.00–0.07)
Basophils Absolute: 0 10*3/uL (ref 0.0–0.1)
Basophils Relative: 0 %
Eosinophils Absolute: 0 10*3/uL (ref 0.0–0.5)
Eosinophils Relative: 0 %
HCT: 35.6 % — ABNORMAL LOW (ref 36.0–46.0)
Hemoglobin: 12 g/dL (ref 12.0–15.0)
Immature Granulocytes: 0 %
Lymphocytes Relative: 11 %
Lymphs Abs: 1.1 10*3/uL (ref 0.7–4.0)
MCH: 29.1 pg (ref 26.0–34.0)
MCHC: 33.7 g/dL (ref 30.0–36.0)
MCV: 86.4 fL (ref 80.0–100.0)
Monocytes Absolute: 0.8 10*3/uL (ref 0.1–1.0)
Monocytes Relative: 8 %
Neutro Abs: 7.8 10*3/uL — ABNORMAL HIGH (ref 1.7–7.7)
Neutrophils Relative %: 81 %
Platelet Count: 249 10*3/uL (ref 150–400)
RBC: 4.12 MIL/uL (ref 3.87–5.11)
RDW: 13.9 % (ref 11.5–15.5)
WBC Count: 9.7 10*3/uL (ref 4.0–10.5)
nRBC: 0 % (ref 0.0–0.2)

## 2023-11-26 MED ORDER — SODIUM CHLORIDE 0.9 % IV SOLN
75.0000 mg/m2 | Freq: Once | INTRAVENOUS | Status: AC
  Administered 2023-11-26: 115 mg via INTRAVENOUS
  Filled 2023-11-26: qty 11.5

## 2023-11-26 MED ORDER — SODIUM CHLORIDE 0.9% FLUSH
10.0000 mL | INTRAVENOUS | Status: DC | PRN
  Administered 2023-11-26: 10 mL

## 2023-11-26 MED ORDER — SODIUM CHLORIDE 0.9 % IV SOLN: INTRAVENOUS | Status: DC

## 2023-11-26 MED ORDER — SODIUM CHLORIDE 0.9 % IV SOLN
600.0000 mg/m2 | Freq: Once | INTRAVENOUS | Status: AC
  Administered 2023-11-26: 1000 mg via INTRAVENOUS
  Filled 2023-11-26: qty 50

## 2023-11-26 MED ORDER — PALONOSETRON HCL INJECTION 0.25 MG/5ML
0.2500 mg | Freq: Once | INTRAVENOUS | Status: AC
  Administered 2023-11-26: 0.25 mg via INTRAVENOUS
  Filled 2023-11-26: qty 5

## 2023-11-26 MED ORDER — SODIUM CHLORIDE 0.9% FLUSH
10.0000 mL | Freq: Once | INTRAVENOUS | Status: AC
  Administered 2023-11-26: 10 mL

## 2023-11-26 MED ORDER — HEPARIN SOD (PORK) LOCK FLUSH 100 UNIT/ML IV SOLN
500.0000 [IU] | Freq: Once | INTRAVENOUS | Status: AC | PRN
  Administered 2023-11-26: 500 [IU]

## 2023-11-26 MED ORDER — DEXAMETHASONE SODIUM PHOSPHATE 10 MG/ML IJ SOLN
10.0000 mg | Freq: Once | INTRAMUSCULAR | Status: AC
  Administered 2023-11-26: 10 mg via INTRAVENOUS
  Filled 2023-11-26: qty 1

## 2023-11-26 NOTE — Patient Instructions (Signed)
 [facility] A [facility] OF [facility]  Discharge Instructions: Thank you for choosing [facility] to provide your oncology and hematology care.   If you have a lab appointment with the Cancer Center, please go directly to the Cancer Center and check in at the registration area.   Wear comfortable clothing and clothing appropriate for easy access to any Portacath or PICC line.   We strive to give you quality time with your provider. You may need to reschedule your appointment if you arrive late (15 or more minutes).  Arriving late affects you and other patients whose appointments are after yours.  Also, if you miss three or more appointments without notifying the office, you may be dismissed from the clinic at the provider's discretion.      For prescription refill requests, have your pharmacy contact our office and allow 72 hours for refills to be completed.    Today you received the following chemotherapy and/or immunotherapy agents: docetaxel and cyclophosphamide      To help prevent nausea and vomiting after your treatment, we encourage you to take your nausea medication as directed.  BELOW ARE SYMPTOMS THAT SHOULD BE REPORTED IMMEDIATELY: *FEVER GREATER THAN 100.4 F (38 C) OR HIGHER *CHILLS OR SWEATING *NAUSEA AND VOMITING THAT IS NOT CONTROLLED WITH YOUR NAUSEA MEDICATION *UNUSUAL SHORTNESS OF BREATH *UNUSUAL BRUISING OR BLEEDING *URINARY PROBLEMS (pain or burning when urinating, or frequent urination) *BOWEL PROBLEMS (unusual diarrhea, constipation, pain near the anus) TENDERNESS IN MOUTH AND THROAT WITH OR WITHOUT PRESENCE OF ULCERS (sore throat, sores in mouth, or a toothache) UNUSUAL RASH, SWELLING OR PAIN  UNUSUAL VAGINAL DISCHARGE OR ITCHING   Items with * indicate a potential emergency and should be followed up as soon as possible or go to the Emergency Department if any problems should occur.  Please show the CHEMOTHERAPY ALERT  CARD or IMMUNOTHERAPY ALERT CARD at check-in to the Emergency Department and triage nurse.  Should you have questions after your visit or need to cancel or reschedule your appointment, please contact [facility]  Dept: [phone]  and follow the prompts.  Office hours are 8:00 a.m. to 4:30 p.m. Monday - Friday. Please note that voicemails left after 4:00 p.m. may not be returned until the following business day.  We are closed weekends and major holidays. You have access to a nurse at all times for urgent questions. Please call the main number to the clinic Dept: [phone] and follow the prompts.   For any non-urgent questions, you may also contact your provider using MyChart. We now offer e-Visits for anyone 25 and older to request care online for non-urgent symptoms. For details visit [email or web address].   Also download the MyChart app! Go to the app store, search "MyChart", open the app, select [place], and log in with your MyChart username and password.

## 2023-11-27 ENCOUNTER — Ambulatory Visit

## 2023-11-27 ENCOUNTER — Other Ambulatory Visit

## 2023-11-27 ENCOUNTER — Ambulatory Visit: Admitting: Hematology and Oncology

## 2023-11-27 ENCOUNTER — Other Ambulatory Visit: Payer: Self-pay | Admitting: *Deleted

## 2023-11-27 ENCOUNTER — Inpatient Hospital Stay

## 2023-11-27 VITALS — BP 137/55 | HR 61 | Temp 98.5°F | Resp 16

## 2023-11-27 DIAGNOSIS — Z171 Estrogen receptor negative status [ER-]: Secondary | ICD-10-CM

## 2023-11-27 DIAGNOSIS — Z5111 Encounter for antineoplastic chemotherapy: Secondary | ICD-10-CM | POA: Diagnosis not present

## 2023-11-27 DIAGNOSIS — C50411 Malignant neoplasm of upper-outer quadrant of right female breast: Secondary | ICD-10-CM

## 2023-11-27 MED ORDER — PEGFILGRASTIM-JMDB 6 MG/0.6ML ~~LOC~~ SOSY
6.0000 mg | PREFILLED_SYRINGE | Freq: Once | SUBCUTANEOUS | Status: AC
  Administered 2023-11-27: 6 mg via SUBCUTANEOUS
  Filled 2023-11-27: qty 0.6

## 2023-11-27 NOTE — Patient Instructions (Signed)

## 2023-11-27 NOTE — Progress Notes (Signed)
 Pt. Here for injection.  Request pharmacy to be changed to [facility] [address] [place] [address].  Secure chatted information to Valerie/RN.  She states she will change the Pharmacy information as requested

## 2023-11-28 ENCOUNTER — Telehealth: Payer: Self-pay | Admitting: *Deleted

## 2023-11-28 NOTE — Telephone Encounter (Signed)
 This RN called pt per follow up from treatment on Monday 6/9 with neulasta injection yesterday.  Pt states overall feeling good with symptom of  mild headache  She states she tires easily but then sits and rests  and then get up and keep going  She denies any nausea, vomiting bowel or bladder issues.  No further needs at this time.

## 2023-11-29 ENCOUNTER — Ambulatory Visit

## 2023-12-07 ENCOUNTER — Other Ambulatory Visit: Payer: Self-pay

## 2023-12-07 MED ORDER — CIPROFLOXACIN HCL 500 MG PO TABS
500.0000 mg | ORAL_TABLET | Freq: Two times a day (BID) | ORAL | 0 refills | Status: AC
Start: 2023-12-07 — End: 2023-12-14

## 2023-12-07 NOTE — Telephone Encounter (Signed)
 Pt called at 4:29 6/20 and states she started having sx of what feels like a UTI 6/19. She reports that she is experiencing burning at the end of her urine stream. Once urinating is complete, burning alleviates. She is concerned for UTI. Due to the weekend and [facility] lab being closed, we will send Cipro 500 mg BID x 7 days per MD.  Pt knows to call Monday if sx have not improved with cipro. If sx have not improved we will bring her in for UA/UC. She is agreeable and verbalized thanks.

## 2023-12-11 ENCOUNTER — Other Ambulatory Visit

## 2023-12-14 ENCOUNTER — Telehealth: Payer: Self-pay

## 2023-12-14 NOTE — Telephone Encounter (Signed)
 Verbally confirmed appt for 6/30

## 2023-12-17 ENCOUNTER — Inpatient Hospital Stay

## 2023-12-17 ENCOUNTER — Inpatient Hospital Stay: Admitting: Hematology and Oncology

## 2023-12-17 VITALS — BP 133/63 | HR 70 | Temp 97.7°F | Resp 16

## 2023-12-17 DIAGNOSIS — C50411 Malignant neoplasm of upper-outer quadrant of right female breast: Secondary | ICD-10-CM

## 2023-12-17 DIAGNOSIS — Z5111 Encounter for antineoplastic chemotherapy: Secondary | ICD-10-CM | POA: Diagnosis not present

## 2023-12-17 DIAGNOSIS — Z171 Estrogen receptor negative status [ER-]: Secondary | ICD-10-CM | POA: Diagnosis not present

## 2023-12-17 DIAGNOSIS — Z95828 Presence of other vascular implants and grafts: Secondary | ICD-10-CM

## 2023-12-17 LAB — CBC WITH DIFFERENTIAL (CANCER CENTER ONLY)
Abs Immature Granulocytes: 0.05 10*3/uL (ref 0.00–0.07)
Basophils Absolute: 0.1 10*3/uL (ref 0.0–0.1)
Basophils Relative: 1 %
Eosinophils Absolute: 0 10*3/uL (ref 0.0–0.5)
Eosinophils Relative: 0 %
HCT: 32.7 % — ABNORMAL LOW (ref 36.0–46.0)
Hemoglobin: 10.9 g/dL — ABNORMAL LOW (ref 12.0–15.0)
Immature Granulocytes: 1 %
Lymphocytes Relative: 17 %
Lymphs Abs: 1.1 10*3/uL (ref 0.7–4.0)
MCH: 28.7 pg (ref 26.0–34.0)
MCHC: 33.3 g/dL (ref 30.0–36.0)
MCV: 86.1 fL (ref 80.0–100.0)
Monocytes Absolute: 0.7 10*3/uL (ref 0.1–1.0)
Monocytes Relative: 12 %
Neutro Abs: 4.4 10*3/uL (ref 1.7–7.7)
Neutrophils Relative %: 69 %
Platelet Count: 459 10*3/uL — ABNORMAL HIGH (ref 150–400)
RBC: 3.8 MIL/uL — ABNORMAL LOW (ref 3.87–5.11)
RDW: 14.9 % (ref 11.5–15.5)
WBC Count: 6.4 10*3/uL (ref 4.0–10.5)
nRBC: 0 % (ref 0.0–0.2)

## 2023-12-17 LAB — CMP (CANCER CENTER ONLY)
ALT: 21 U/L (ref 0–44)
AST: 15 U/L (ref 15–41)
Albumin: 4 g/dL (ref 3.5–5.0)
Alkaline Phosphatase: 75 U/L (ref 38–126)
Anion gap: 6 (ref 5–15)
BUN: 11 mg/dL (ref 8–23)
CO2: 26 mmol/L (ref 22–32)
Calcium: 8.7 mg/dL — ABNORMAL LOW (ref 8.9–10.3)
Chloride: 106 mmol/L (ref 98–111)
Creatinine: 0.6 mg/dL (ref 0.44–1.00)
GFR, Estimated: >60 mL/min (ref >60)
Glucose, Bld: 73 mg/dL (ref 70–99)
Potassium: 4.1 mmol/L (ref 3.5–5.1)
Sodium: 138 mmol/L (ref 135–145)
Total Bilirubin: 0.4 mg/dL (ref 0.0–1.2)
Total Protein: 6.3 g/dL — ABNORMAL LOW (ref 6.5–8.1)

## 2023-12-17 MED ORDER — DEXAMETHASONE SODIUM PHOSPHATE 10 MG/ML IJ SOLN
10.0000 mg | Freq: Once | INTRAMUSCULAR | Status: AC
  Administered 2023-12-17: 10 mg via INTRAVENOUS
  Filled 2023-12-17: qty 1

## 2023-12-17 MED ORDER — SODIUM CHLORIDE 0.9% FLUSH
10.0000 mL | Freq: Once | INTRAVENOUS | Status: AC
  Administered 2023-12-17: 10 mL

## 2023-12-17 MED ORDER — SODIUM CHLORIDE 0.9 % IV SOLN
600.0000 mg/m2 | Freq: Once | INTRAVENOUS | Status: AC
  Administered 2023-12-17: 1000 mg via INTRAVENOUS
  Filled 2023-12-17: qty 50

## 2023-12-17 MED ORDER — SODIUM CHLORIDE 0.9 % IV SOLN
75.0000 mg/m2 | Freq: Once | INTRAVENOUS | Status: AC
  Administered 2023-12-17: 115 mg via INTRAVENOUS
  Filled 2023-12-17: qty 11.5

## 2023-12-17 MED ORDER — PALONOSETRON HCL INJECTION 0.25 MG/5ML
0.2500 mg | Freq: Once | INTRAVENOUS | Status: AC
  Administered 2023-12-17: 0.25 mg via INTRAVENOUS
  Filled 2023-12-17: qty 5

## 2023-12-17 MED ORDER — SODIUM CHLORIDE 0.9 % IV SOLN: INTRAVENOUS | Status: DC

## 2023-12-17 MED ORDER — SODIUM CHLORIDE 0.9% FLUSH
10.0000 mL | INTRAVENOUS | Status: DC | PRN
  Administered 2023-12-17: 10 mL

## 2023-12-17 MED ORDER — HEPARIN SOD (PORK) LOCK FLUSH 100 UNIT/ML IV SOLN
500.0000 [IU] | Freq: Once | INTRAVENOUS | Status: AC | PRN
  Administered 2023-12-17: 500 [IU]

## 2023-12-17 NOTE — Patient Instructions (Signed)
 [facility]  Discharge Instructions: Thank you for choosing [facility] to provide your oncology and hematology care.   If you have a lab appointment with the Cancer Center, please go directly to the Cancer Center and check in at the registration area.   Wear comfortable clothing and clothing appropriate for easy access to any Portacath or PICC line.   We strive to give you quality time with your provider. You may need to reschedule your appointment if you arrive late (15 or more minutes).  Arriving late affects you and other patients whose appointments are after yours.  Also, if you miss three or more appointments without notifying the office, you may be dismissed from the clinic at the provider's discretion.      For prescription refill requests, have your pharmacy contact our office and allow 72 hours for refills to be completed.    Today you received the following chemotherapy and/or immunotherapy agents: Docetaxel (Taxotere) and Cyclophosphamide (Cytoxan)       To help prevent nausea and vomiting after your treatment, we encourage you to take your nausea medication as directed.  BELOW ARE SYMPTOMS THAT SHOULD BE REPORTED IMMEDIATELY: *FEVER GREATER THAN 100.4 F (38 C) OR HIGHER *CHILLS OR SWEATING *NAUSEA AND VOMITING THAT IS NOT CONTROLLED WITH YOUR NAUSEA MEDICATION *UNUSUAL SHORTNESS OF BREATH *UNUSUAL BRUISING OR BLEEDING *URINARY PROBLEMS (pain or burning when urinating, or frequent urination) *BOWEL PROBLEMS (unusual diarrhea, constipation, pain near the anus) TENDERNESS IN MOUTH AND THROAT WITH OR WITHOUT PRESENCE OF ULCERS (sore throat, sores in mouth, or a toothache) UNUSUAL RASH, SWELLING OR PAIN  UNUSUAL VAGINAL DISCHARGE OR ITCHING   Items with * indicate a potential emergency and should be followed up as soon as possible or go to the Emergency Department if any problems should occur.  Please show  the CHEMOTHERAPY ALERT CARD or IMMUNOTHERAPY ALERT CARD at check-in to the Emergency Department and triage nurse.  Should you have questions after your visit or need to cancel or reschedule your appointment, please contact [facility]  Dept: [phone]  and follow the prompts.  Office hours are 8:00 a.m. to 4:30 p.m. Monday - Friday. Please note that voicemails left after 4:00 p.m. may not be returned until the following business day.  We are closed weekends and major holidays. You have access to a nurse at all times for urgent questions. Please call the main number to the clinic Dept: [phone] and follow the prompts.   For any non-urgent questions, you may also contact your provider using MyChart. We now offer e-Visits for anyone 48 and older to request care online for non-urgent symptoms. For details visit [email or web address].   Also download the MyChart app! Go to the app store, search "MyChart", open the app, select [place], and log in with your MyChart username and password.

## 2023-12-17 NOTE — Progress Notes (Signed)
 [facility] CONSULT NOTE  Patient Care Team: Thurmond Cathlyn LABOR., MD as PCP - General (Internal Medicine) Glean Stephane BROCKS, RN (Inactive) as Oncology Nurse Navigator Tyree Nanetta SAILOR, RN as Oncology Nurse Navigator Loretha Ash, MD as Consulting Physician (Hematology and Oncology) Ebbie Cough, MD as Consulting Physician (General Surgery) Shannon Agent, MD as Consulting Physician (Radiation Oncology)  CHIEF COMPLAINTS/PURPOSE OF CONSULTATION:  Right breast cancer.  ASSESSMENT & PLAN:   Assessment and Plan Assessment & Plan Chemotherapy treatment plan, she is here before planned cycle 2 of TC. Tolerating it well. No dosage adjustment needed. Labs satisfactory to proceed with treatment.  Chemotherapy-induced nausea Taking medication for nausea and will receive additional anti-nausea medication in the IV during today's treatment.  Chemotherapy-induced rash Reports itching in both upper arms, likely related to chemotherapy. - Apply moisturizer to affected areas.  Chemotherapy-induced alopecia Experiencing hair loss, managed with a wig and a baseball hat.  IDA - Continue taking iron pills. - Consider IV iron post-chemotherapy.    HISTORY OF PRESENTING ILLNESS:  Heather Lewis 78 y.o. female is here because of newly diagnosed breast cancer  Oncology History  Malignant neoplasm of upper-outer quadrant of right breast in female, estrogen receptor negative (HCC)  08/28/2023 Initial Diagnosis   Malignant neoplasm of upper-outer quadrant of right breast in female, estrogen receptor negative (HCC)    Mammogram   Highly suspicious 1.8 cm palpable mass right breast 10 o'clock position. Highly suspicious 0.9 cm mass noted adjacent to the dominant palpable mass at the 10 o'clock position. The distance between the 2 masses measures less than 1 cm. Indeterminate grouped calcifications in the central outer right breast at posterior depth.    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  Report date is 09/12/2023.   The CancerNext-Expanded gene panel offered by [facility] and includes sequencing, rearrangement, and RNA analysis for the following 76 genes: AIP, ALK, APC, ATM, AXIN2, BAP1, BARD1, BMPR1A, BRCA1, BRCA2, BRIP1, CDC73, CDH1, CDK4, CDKN1B, CDKN2A, CEBPA, CHEK2, CTNNA1, DDX41, DICER1, ETV6, FH, FLCN, GATA2, LZTR1, MAX, MBD4, MEN1, MET, MLH1, MSH2, MSH3, MSH6, MUTYH, NF1, NF2, NTHL1, PALB2, PHOX2B, PMS2, POT1, PRKAR1A, PTCH1, PTEN, RAD51C, RAD51D, RB1, RET, RUNX1, SDHA, SDHAF2, SDHB, SDHC, SDHD, SMAD4, SMARCA4, SMARCB1, SMARCE1, STK11, SUFU, TMEM127, TP53, TSC1, TSC2, VHL, and WT1 (sequencing and deletion/duplication); EGFR, HOXB13, KIT, MITF, PDGFRA, POLD1, and POLE (sequencing only); EPCAM and GREM1 (deletion/duplication only).    09/13/2023 Definitive Surgery   Right breast lumpectomy, foci of IDC discontinuously involving a fibrotic area of 1.8 cms, grade 3, extensive cancerization of lobules, DCIS high grade, neg margins,  Rt axillary sentinel LN negative. Additional medial margin ( deep resection margin pos for DCIS)   11/26/2023 -  Chemotherapy   Patient is on Treatment Plan : BREAST TC q21d      History of Present Illness  Heather Lewis is a 78 year old female with triple negative breast cancer who presents before second cycle of chemotherapy.  She is managing well with chemotherapy, maintaining her routine activities, including baking, which  she finds enjoyable and therapeutic.  Her nutritional intake is good, and she is able to eat well, including enjoying ice cream at night.  During chemotherapy, she experienced bone pain after receiving a shot the day following her treatment, which was alleviated with Claritin. She recently had a urinary tract infection that has resolved, with no current urinary symptoms, fever, or chills.  She is experiencing hair loss, which began last Thursday and progressed to clumps by Saturday. She has prepared for this by obtaining a wig and wearing a baseball hat, which she feels comfortable with.  She mentions itching on her upper arms, which she suspects might be related to a cream she was using, although it could also be a side effect of the chemotherapy.  She is currently taking iron pills, which she dislikes due to the metallic taste, but continues as part of her treatment regimen. No tingling or numbness in her hands or feet. No current fever, chills, or other systemic symptoms.    MEDICAL HISTORY:  Past Medical History:  Diagnosis Date   Age-related macular degeneration    Bunion of right foot 08/2017   Cancer (HCC) 08/2023   right breast ICD with DCIS   Complication of anesthesia    hard to wake up from colonoscopy when Propofol  not used   Dental crowns present    Metatarsalgia of right foot 08/2017   2nd toe   Migraines    with change of weather   TMJ syndrome     SURGICAL HISTORY: Past Surgical History:  Procedure Laterality Date   APPENDECTOMY  1960   AXILLARY SENTINEL NODE BIOPSY Right 09/13/2023   Procedure: BIOPSY, LYMPH NODE, SENTINEL, AXILLARY;  Surgeon: Ebbie Cough, MD;  Location: [facility];  Service: General;  Laterality: Right;  right   BREAST BIOPSY Right 08/23/2023   US  RT BREAST BX W LOC DEV 1ST LESION IMG BX SPEC US  GUIDE 08/23/2023 [facility]MOGRAPHY   BREAST BIOPSY Right 08/23/2023   US  RT BREAST BX W LOC DEV EA ADD LESION IMG BX SPEC US  GUIDE 08/23/2023 [facility]MO[facility]   BREAST  BIOPSY Right 08/23/2023   MM RT BREAST BX W LOC DEV 1ST LESION IMAGE BX SPEC STEREO GUIDE 08/23/2023 [facility]MOGRAPHY   BREAST BIOPSY  09/11/2023   MM RT RADIOACTIVE SEED EA ADD LESION LOC MAMMO GUIDE 09/11/2023 [facility]AMMOGRAPHY   BREAST BIOPSY  09/11/2023   MM RT RADIOACTIVE SEED EA ADD LESION LOC MAMMO GUIDE 09/11/2023 [facility]AMMOGRAPHY   BREAST BIOPSY  09/11/2023   MM RT RADIOACTIVE SEED LOC MAMMO GUIDE 09/11/2023 [facility]AMMOGRAPHY   BREAST LUMPECTOMY Right 10/08/2023   Procedure: BREAST LUMPECTOMY;  Surgeon: Ebbie Cough, MD;  Location: [facility];  Service: General;  Laterality: Right;  RIGHT BREAST MARGIN RE-EXCISION   BREAST LUMPECTOMY Right 11/06/2023   Procedure: RIGHT BREAST RE-EXCISION LUMPECTOMY;  Surgeon: Ebbie Cough, MD;  Location: [facility];  Service: General;  Laterality: Right;   BREAST LUMPECTOMY WITH RADIOACTIVE SEED LOCALIZATION Right 09/13/2023   Procedure: BREAST LUMPECTOMY WITH RADIOACTIVE SEED LOCALIZATION;  Surgeon: Ebbie Cough, MD;  Location: [facility];  Service: General;  Laterality: Right;  Right breast radioactive seed bracketed lumpectomy   BUNIONECTOMY WITH WEIL OSTEOTOMY Right 09/06/2017   Procedure: Right Lapidus, Modified Woodard Edwards;  Surgeon: Kit Rush, MD;  Location: [facility];  Service: Orthopedics;  Laterality: Right;   COLONOSCOPY WITH PROPOFOL   08/08/2016   HAMMERTOE RECONSTRUCTION WITH WEIL OSTEOTOMY  06/27/2018  Procedure: LEFT SECOND HAMMERTOE RECONSTRUCTION WITH WEIL OSTEOTOMY;  Surgeon: Kit Rush, MD;  Location: [facility];  Service: Orthopedics;;   PORTACATH PLACEMENT Right 09/13/2023   Procedure: INSERTION, TUNNELED CENTRAL VENOUS DEVICE, WITH PORT;  Surgeon: Ebbie Cough, MD;  Location: [facility];  Service: General;  Laterality: Right;  GEN w/PEC BLOCK   TARSAL METATARSAL ARTHRODESIS Left 06/27/2018   Procedure: Left modified  McBride and lapidus 1st tarsometatarsal arthrodesis;  Surgeon: Kit Rush, MD;  Location: [facility];  Service: Orthopedics;  Laterality: Left;    TONSILLECTOMY AND ADENOIDECTOMY  1954    SOCIAL HISTORY: Social History   Socioeconomic History   Marital status: Married    Spouse name: Not on file   Number of children: Not on file   Years of education: Not on file   Highest education level: Not on file  Occupational History   Not on file  Tobacco Use   Smoking status: Never   Smokeless tobacco: Never  Vaping Use   Vaping status: Never Used  Substance and Sexual Activity   Alcohol  use: Not Currently   Drug use: No   Sexual activity: Not Currently    Birth control/protection: Post-menopausal  Other Topics Concern   Not on file  Social History Narrative   Not on file   Social Drivers of Health   Financial Resource Strain: Not on file  Food Insecurity: Low Risk  (11/12/2023)   Received from [facility]   Hunger Vital Sign    Within the past 12 months, you worried that your food would run out before you got money to buy more: Never true    Within the past 12 months, the food you bought just didn't last and you didn't have money to get more. : Never true  Transportation Needs: No Transportation Needs (11/12/2023)   Received from [facility]    In the past 12 months, has lack of reliable transportation kept you from medical appointments, meetings, work or from getting things needed for daily living? : No  Physical Activity: Not on file  Stress: Not on file  Social Connections: Not on file  Intimate Partner Violence: Not At Risk (08/29/2023)   Humiliation, Afraid, Rape, and Kick questionnaire    Fear of Current or Ex-Partner: No    Emotionally Abused: No    Physically Abused: No    Sexually Abused: No    FAMILY HISTORY: Family History  Problem Relation Age of Onset   Colon polyps Mother    Lung cancer Mother        d. 37    Colon polyps Father    Melanoma Father    Colon cancer Brother        and polyps; dx 74s   Lung cancer Maternal Aunt    Lung cancer Maternal Grandfather    Breast cancer Niece        dx 22s    ALLERGIES:  is allergic to nitrates, organic and oxycodone .  MEDICATIONS:  Current Outpatient Medications  Medication Sig Dispense Refill   cholecalciferol (VITAMIN D) 1000 units tablet Take 1,000 Units by mouth daily.     dexamethasone  (DECADRON ) 4 MG tablet Take 2 tabs by mouth 2 times daily starting day before chemo. Then take 2 tabs daily for 2 days starting day after chemo. Take with food. 30 tablet 1   docusate sodium  (COLACE) 100 MG capsule Take 1 capsule (100 mg total) by mouth 2 (two)  times daily. While taking narcotic pain medicine. 30 capsule 0   ibuprofen (ADVIL,MOTRIN) 800 MG tablet Take 800 mg by mouth every 8 (eight) hours as needed. migraines     lidocaine -prilocaine  (EMLA ) cream Apply to affected area once 30 g 3   Multiple Vitamins-Minerals (EYE VITAMINS PO) Take by mouth.     ondansetron  (ZOFRAN ) 8 MG tablet Take 1 tablet (8 mg total) by mouth every 8 (eight) hours as needed for nausea or vomiting. Start on the third day after chemotherapy. 30 tablet 1   prochlorperazine  (COMPAZINE ) 10 MG tablet Take 1 tablet (10 mg total) by mouth every 6 (six) hours as needed for nausea or vomiting. 30 tablet 1   senna (SENOKOT) 8.6 MG TABS tablet Take 2 tablets (17.2 mg total) by mouth 2 (two) times daily. 30 each 0   No current facility-administered medications for this visit.     PHYSICAL EXAMINATION: ECOG PERFORMANCE STATUS: 0 - Asymptomatic  Vitals:   12/17/23 1008  BP: (!) 124/53  Pulse: 64  Resp: 17  Temp: 98.6 F (37 C)  SpO2: 100%      Filed Weights   12/17/23 1008  Weight: 118 lb 1.6 oz (53.6 kg)    GENERAL:alert, no distress and comfortable Port site appears well CTA bilaterally RRR No LE edema   LABORATORY DATA:  I have reviewed the data as listed Lab  Results  Component Value Date   WBC 6.4 12/17/2023   HGB 10.9 (L) 12/17/2023   HCT 32.7 (L) 12/17/2023   MCV 86.1 12/17/2023   PLT 459 (H) 12/17/2023     Chemistry      Component Value Date/Time   NA 138 12/17/2023 0853   K 4.1 12/17/2023 0853   CL 106 12/17/2023 0853   CO2 26 12/17/2023 0853   BUN 11 12/17/2023 0853   CREATININE 0.60 12/17/2023 0853      Component Value Date/Time   CALCIUM 8.7 (L) 12/17/2023 0853   ALKPHOS 75 12/17/2023 0853   AST 15 12/17/2023 0853   ALT 21 12/17/2023 0853   BILITOT 0.4 12/17/2023 0853       RADIOGRAPHIC STUDIES: I have personally reviewed the radiological images as listed and agreed with the findings in the report. No results found.   All questions were answered. The patient knows to call the clinic with any problems, questions or concerns. I spent 30 minutes in the care of this patient including H and P, review of records, counseling and coordination of care.     Amber Stalls, MD 12/17/2023 10:12 AM

## 2023-12-17 NOTE — Assessment & Plan Note (Signed)
 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  S/p lumpectomy with 1.8 cm triple neg breast cancer, neg margins for invasive carcinoma, LN neg.  Assessment and Plan Assessment & Plan Triple negative breast cancer Triple negative breast cancer, post-surgery with benign margins.  - will need adj TC every 21 days times 4 cycles.  Concerns about chemotherapy strength and side effects addressed. Declined cold cap. Port in place for administration. - Schedule first chemotherapy session for next Monday. - Administer GCSF 24 hours post-chemotherapy session. - Monitor for side effects and adjust treatment as necessary. - Ensure insurance authorization for chemotherapy drugs. - RTC before C2 of TC.

## 2023-12-18 ENCOUNTER — Other Ambulatory Visit

## 2023-12-18 ENCOUNTER — Other Ambulatory Visit: Payer: Self-pay

## 2023-12-18 ENCOUNTER — Ambulatory Visit

## 2023-12-18 ENCOUNTER — Ambulatory Visit: Admitting: Hematology and Oncology

## 2023-12-19 ENCOUNTER — Inpatient Hospital Stay: Attending: Hematology and Oncology

## 2023-12-19 VITALS — BP 125/56 | HR 76 | Temp 97.9°F | Resp 16

## 2023-12-19 DIAGNOSIS — D649 Anemia, unspecified: Secondary | ICD-10-CM | POA: Insufficient documentation

## 2023-12-19 DIAGNOSIS — C50411 Malignant neoplasm of upper-outer quadrant of right female breast: Secondary | ICD-10-CM | POA: Insufficient documentation

## 2023-12-19 DIAGNOSIS — Z5189 Encounter for other specified aftercare: Secondary | ICD-10-CM | POA: Diagnosis not present

## 2023-12-19 DIAGNOSIS — N390 Urinary tract infection, site not specified: Secondary | ICD-10-CM | POA: Insufficient documentation

## 2023-12-19 DIAGNOSIS — Z171 Estrogen receptor negative status [ER-]: Secondary | ICD-10-CM | POA: Insufficient documentation

## 2023-12-19 DIAGNOSIS — Z1732 Human epidermal growth factor receptor 2 negative status: Secondary | ICD-10-CM | POA: Diagnosis not present

## 2023-12-19 DIAGNOSIS — Z1722 Progesterone receptor negative status: Secondary | ICD-10-CM | POA: Insufficient documentation

## 2023-12-19 MED ORDER — PEGFILGRASTIM-JMDB 6 MG/0.6ML ~~LOC~~ SOSY
6.0000 mg | PREFILLED_SYRINGE | Freq: Once | SUBCUTANEOUS | Status: AC
  Administered 2023-12-19: 6 mg via SUBCUTANEOUS
  Filled 2023-12-19: qty 0.6

## 2023-12-31 ENCOUNTER — Ambulatory Visit: Payer: Self-pay | Attending: Hematology and Oncology

## 2023-12-31 VITALS — Wt 120.1 lb

## 2023-12-31 DIAGNOSIS — Z483 Aftercare following surgery for neoplasm: Secondary | ICD-10-CM | POA: Insufficient documentation

## 2023-12-31 NOTE — Therapy (Signed)
 OUTPATIENT PHYSICAL THERAPY SOZO SCREENING NOTE   Patient Name: Heather Lewis MRN: [id number] DOB:[birth year], 78 y.o., female Today's Date: 12/31/2023  PCP: Thurmond Cathlyn LABOR., MD REFERRING PROVIDER: Loretha Ash, MD   PT End of Session - 12/31/23 [phone]     Visit Number 2   # unchanged due to screen only   PT Start Time 0827    PT Stop Time 0832    PT Time Calculation (min) 5 min    Activity Tolerance Patient tolerated treatment well    Behavior During Therapy [facility] for tasks assessed/performed          Past Medical History:  Diagnosis Date   Age-related macular degeneration    Bunion of right foot 08/2017   Cancer (HCC) 08/2023   right breast ICD with DCIS   Complication of anesthesia    hard to wake up from colonoscopy when Propofol  not used   Dental crowns present    Metatarsalgia of right foot 08/2017   2nd toe   Migraines    with change of weather   TMJ syndrome    Past Surgical History:  Procedure Laterality Date   APPENDECTOMY  1960   AXILLARY SENTINEL NODE BIOPSY Right 09/13/2023   Procedure: BIOPSY, LYMPH NODE, SENTINEL, AXILLARY;  Surgeon: Ebbie Cough, MD;  Location: [facility];  Service: General;  Laterality: Right;  right   BREAST BIOPSY Right 08/23/2023   US  RT BREAST BX W LOC DEV 1ST LESION IMG BX SPEC US  GUIDE 08/23/2023 [facility]MO[facility]AP[facility]   BREAST BIOPSY Right 08/23/2023   US  RT BREAST BX W LOC DEV EA ADD LESION IMG BX SPEC US  GUIDE 08/23/2023 [facility]AMMOGRAP[facility]   BREAST BIOPSY Right 08/23/2023   MM RT BREAST BX W LOC DEV 1ST LESION IMAGE BX SPEC STEREO GUIDE 08/23/2023 [facility]AMMOGRAP[facility]   BREAST BIOPSY  09/11/2023   MM RT RADIOACTIVE SEED EA ADD LESION LOC MAMMO GUIDE 09/11/2023 [facility]MO[facility]   BREAST BIOPSY  09/11/2023   MM RT RADIOACTIVE SEED EA ADD LESION LOC MAMMO GUIDE 09/11/2023 [facility]AMMO[facility]AP[facility]   BREAST BIOPSY  09/11/2023   MM RT RADIOACTIVE SEED LOC MAMMO GUIDE 09/11/2023 [facility]MO[facility]   BREAST LUMPECTOMY Right 10/08/2023    Procedure: BREAST LUMPECTOMY;  Surgeon: Ebbie Cough, MD;  Location: [facility];  Service: General;  Laterality: Right;  RIGHT BREAST MARGIN RE-EXCISION   BREAST LUMPECTOMY Right 11/06/2023   Procedure: RIGHT BREAST RE-EXCISION LUMPECTOMY;  Surgeon: Ebbie Cough, MD;  Location: [facility];  Service: General;  Laterality: Right;   BREAST LUMPECTOMY WITH RADIOACTIVE SEED LOCALIZATION Right 09/13/2023   Procedure: BREAST LUMPECTOMY WITH RADIOACTIVE SEED LOCALIZATION;  Surgeon: Ebbie Cough, MD;  Location: [facility];  Service: General;  Laterality: Right;  Right breast radioactive seed bracketed lumpectomy   BUNIONECTOMY WITH WEIL OSTEOTOMY Right 09/06/2017   Procedure: Right Lapidus, Modified Woodard Edwards;  Surgeon: Kit Rush, MD;  Location: [facility];  Service: Orthopedics;  Laterality: Right;   COLONOSCOPY WITH PROPOFOL   08/08/2016   HAMMERTOE RECONSTRUCTION WITH WEIL OSTEOTOMY  06/27/2018   Procedure: LEFT SECOND HAMMERTOE RECONSTRUCTION WITH WEIL OSTEOTOMY;  Surgeon: Kit Rush, MD;  Location: [facility];  Service: Orthopedics;;   PORTACATH PLACEMENT Right 09/13/2023   Procedure: INSERTION, TUNNELED CENTRAL VENOUS DEVICE, WITH PORT;  Surgeon: Ebbie Cough, MD;  Location: [facility];  Service: General;  Laterality: Right;  GEN w/PEC BLOCK   TARSAL METATARSAL ARTHRODESIS Left 06/27/2018   Procedure:  Left modified McBride and lapidus 1st tarsometatarsal arthrodesis;  Surgeon: Kit Rush, MD;  Location: [facility];  Service: Orthopedics;  Laterality: Left;    TONSILLECTOMY AND ADENOIDECTOMY  1954   Patient Active Problem List   Diagnosis Date Noted   Port-A-Cath in place 11/26/2023   Genetic testing 09/25/2023   Malignant neoplasm of upper-outer quadrant of right breast in female, estrogen receptor negative (HCC) 08/28/2023    REFERRING DIAG: right breast  cancer at risk for lymphedema  THERAPY DIAG: Aftercare following surgery for neoplasm  PERTINENT HISTORY: Rt lumpectomy with SLNB 09/13/23 with 3 negative nodes removed. Then re-excision 10/08/23 with more positive margins so pt will have a 2nd re-excision on 11/06/23.  Then will have chemotherapy and radiation. Hx: Patient was diagnosed on 08/23/2023 with right grade 3 invasive ductal carcinoma breast cancer. It measured 1.9 cm and was located in the upper outer quadrant. It is triple negative with a Ki67 of 30%. She has a history of a left femur fracture with surgery 02/03/2023.   PRECAUTIONS: right UE Lymphedema risk, None  SUBJECTIVE: Pt returns for her first 3 month L-Dex screen.   PAIN:  Are you having pain? No  SOZO SCREENING: Patient was assessed today using the SOZO machine to determine the lymphedema index score. This was compared to her baseline score. It was determined that she is within the recommended range when compared to her baseline and no further action is needed at this time. She will continue SOZO screenings. These are done every 3 months for 2 years post operatively followed by every 6 months for 2 years, and then annually.   L-DEX FLOWSHEETS - 12/31/23 0800       L-DEX LYMPHEDEMA SCREENING   Measurement Type Unilateral    L-DEX MEASUREMENT EXTREMITY Upper Extremity    POSITION  Standing    DOMINANT SIDE Right    At Risk Side Right    BASELINE SCORE (UNILATERAL) 0.4    L-DEX SCORE (UNILATERAL) 2.2    VALUE CHANGE (UNILAT) 1.8            Aden Berwyn Caldron, PTA 12/31/2023, 8:30 AM

## 2024-01-01 ENCOUNTER — Other Ambulatory Visit: Payer: Self-pay

## 2024-01-07 ENCOUNTER — Encounter: Payer: Self-pay | Admitting: *Deleted

## 2024-01-07 ENCOUNTER — Inpatient Hospital Stay (HOSPITAL_BASED_OUTPATIENT_CLINIC_OR_DEPARTMENT_OTHER): Admitting: Adult Health

## 2024-01-07 ENCOUNTER — Encounter: Payer: Self-pay | Admitting: Adult Health

## 2024-01-07 ENCOUNTER — Inpatient Hospital Stay

## 2024-01-07 VITALS — BP 129/52 | HR 77 | Temp 98.0°F | Resp 18 | Ht 62.0 in | Wt 120.1 lb

## 2024-01-07 DIAGNOSIS — C50411 Malignant neoplasm of upper-outer quadrant of right female breast: Secondary | ICD-10-CM

## 2024-01-07 DIAGNOSIS — R399 Unspecified symptoms and signs involving the genitourinary system: Secondary | ICD-10-CM

## 2024-01-07 DIAGNOSIS — Z171 Estrogen receptor negative status [ER-]: Secondary | ICD-10-CM | POA: Diagnosis not present

## 2024-01-07 DIAGNOSIS — Z95828 Presence of other vascular implants and grafts: Secondary | ICD-10-CM

## 2024-01-07 LAB — CBC WITH DIFFERENTIAL (CANCER CENTER ONLY)
Abs Immature Granulocytes: 0.05 K/uL (ref 0.00–0.07)
Basophils Absolute: 0 K/uL (ref 0.0–0.1)
Basophils Relative: 0 %
Eosinophils Absolute: 0 K/uL (ref 0.0–0.5)
Eosinophils Relative: 0 %
HCT: 29.4 % — ABNORMAL LOW (ref 36.0–46.0)
Hemoglobin: 9.8 g/dL — ABNORMAL LOW (ref 12.0–15.0)
Immature Granulocytes: 1 %
Lymphocytes Relative: 16 %
Lymphs Abs: 1.5 K/uL (ref 0.7–4.0)
MCH: 28.8 pg (ref 26.0–34.0)
MCHC: 33.3 g/dL (ref 30.0–36.0)
MCV: 86.5 fL (ref 80.0–100.0)
Monocytes Absolute: 1 K/uL (ref 0.1–1.0)
Monocytes Relative: 10 %
Neutro Abs: 6.9 K/uL (ref 1.7–7.7)
Neutrophils Relative %: 73 %
Platelet Count: 329 K/uL (ref 150–400)
RBC: 3.4 MIL/uL — ABNORMAL LOW (ref 3.87–5.11)
RDW: 16.1 % — ABNORMAL HIGH (ref 11.5–15.5)
WBC Count: 9.4 K/uL (ref 4.0–10.5)
nRBC: 0 % (ref 0.0–0.2)

## 2024-01-07 LAB — CMP (CANCER CENTER ONLY)
ALT: 26 U/L (ref 0–44)
AST: 17 U/L (ref 15–41)
Albumin: 3.9 g/dL (ref 3.5–5.0)
Alkaline Phosphatase: 75 U/L (ref 38–126)
Anion gap: 7 (ref 5–15)
BUN: 17 mg/dL (ref 8–23)
CO2: 24 mmol/L (ref 22–32)
Calcium: 9.1 mg/dL (ref 8.9–10.3)
Chloride: 105 mmol/L (ref 98–111)
Creatinine: 0.74 mg/dL (ref 0.44–1.00)
GFR, Estimated: >60 mL/min (ref >60)
Glucose, Bld: 142 mg/dL — ABNORMAL HIGH (ref 70–99)
Potassium: 3.3 mmol/L — ABNORMAL LOW (ref 3.5–5.1)
Sodium: 136 mmol/L (ref 135–145)
Total Bilirubin: 0.4 mg/dL (ref 0.0–1.2)
Total Protein: 6.3 g/dL — ABNORMAL LOW (ref 6.5–8.1)

## 2024-01-07 LAB — URINALYSIS, COMPLETE (UACMP) WITH MICROSCOPIC
Bilirubin Urine: NEGATIVE
Glucose, UA: NEGATIVE mg/dL
Hgb urine dipstick: NEGATIVE
Ketones, ur: NEGATIVE mg/dL
Nitrite: NEGATIVE
Protein, ur: NEGATIVE mg/dL
Specific Gravity, Urine: 1.005 (ref 1.005–1.030)
WBC, UA: >50 WBC/hpf (ref 0–5)
pH: 6 (ref 5.0–8.0)

## 2024-01-07 MED ORDER — PALONOSETRON HCL INJECTION 0.25 MG/5ML
0.2500 mg | Freq: Once | INTRAVENOUS | Status: AC
  Administered 2024-01-07: 0.25 mg via INTRAVENOUS
  Filled 2024-01-07: qty 5

## 2024-01-07 MED ORDER — SODIUM CHLORIDE 0.9 % IV SOLN
600.0000 mg/m2 | Freq: Once | INTRAVENOUS | Status: AC
  Administered 2024-01-07: 1000 mg via INTRAVENOUS
  Filled 2024-01-07: qty 50

## 2024-01-07 MED ORDER — SODIUM CHLORIDE 0.9 % IV SOLN
INTRAVENOUS | Status: DC
Start: 2024-01-07 — End: 2024-01-07

## 2024-01-07 MED ORDER — SODIUM CHLORIDE 0.9 % IV SOLN
75.0000 mg/m2 | Freq: Once | INTRAVENOUS | Status: AC
  Administered 2024-01-07: 115 mg via INTRAVENOUS
  Filled 2024-01-07: qty 11.5

## 2024-01-07 MED ORDER — SODIUM CHLORIDE 0.9% FLUSH
10.0000 mL | Freq: Once | INTRAVENOUS | Status: AC
  Administered 2024-01-07: 10 mL

## 2024-01-07 MED ORDER — DEXAMETHASONE SODIUM PHOSPHATE 10 MG/ML IJ SOLN
10.0000 mg | Freq: Once | INTRAMUSCULAR | Status: AC
  Administered 2024-01-07: 10 mg via INTRAVENOUS
  Filled 2024-01-07: qty 1

## 2024-01-07 MED ORDER — HEPARIN SOD (PORK) LOCK FLUSH 100 UNIT/ML IV SOLN
500.0000 [IU] | Freq: Once | INTRAVENOUS | Status: AC | PRN
Start: 2024-01-07 — End: 2024-01-07
  Administered 2024-01-07: 500 [IU]

## 2024-01-07 MED ORDER — SODIUM CHLORIDE 0.9% FLUSH
10.0000 mL | INTRAVENOUS | Status: DC | PRN
  Administered 2024-01-07: 10 mL

## 2024-01-07 NOTE — Patient Instructions (Signed)
 [facility]  Discharge Instructions: Thank you for choosing [facility] to provide your oncology and hematology care.   If you have a lab appointment with the Cancer Center, please go directly to the Cancer Center and check in at the registration area.   Wear comfortable clothing and clothing appropriate for easy access to any Portacath or PICC line.   We strive to give you quality time with your provider. You may need to reschedule your appointment if you arrive late (15 or more minutes).  Arriving late affects you and other patients whose appointments are after yours.  Also, if you miss three or more appointments without notifying the office, you may be dismissed from the clinic at the provider's discretion.      For prescription refill requests, have your pharmacy contact our office and allow 72 hours for refills to be completed.    Today you received the following chemotherapy and/or immunotherapy agents: Docetaxel (Taxotere) and Cyclophosphamide (Cytoxan)       To help prevent nausea and vomiting after your treatment, we encourage you to take your nausea medication as directed.  BELOW ARE SYMPTOMS THAT SHOULD BE REPORTED IMMEDIATELY: *FEVER GREATER THAN 100.4 F (38 C) OR HIGHER *CHILLS OR SWEATING *NAUSEA AND VOMITING THAT IS NOT CONTROLLED WITH YOUR NAUSEA MEDICATION *UNUSUAL SHORTNESS OF BREATH *UNUSUAL BRUISING OR BLEEDING *URINARY PROBLEMS (pain or burning when urinating, or frequent urination) *BOWEL PROBLEMS (unusual diarrhea, constipation, pain near the anus) TENDERNESS IN MOUTH AND THROAT WITH OR WITHOUT PRESENCE OF ULCERS (sore throat, sores in mouth, or a toothache) UNUSUAL RASH, SWELLING OR PAIN  UNUSUAL VAGINAL DISCHARGE OR ITCHING   Items with * indicate a potential emergency and should be followed up as soon as possible or go to the Emergency Department if any problems should occur.  Please show  the CHEMOTHERAPY ALERT CARD or IMMUNOTHERAPY ALERT CARD at check-in to the Emergency Department and triage nurse.  Should you have questions after your visit or need to cancel or reschedule your appointment, please contact [facility]  Dept: [phone]  and follow the prompts.  Office hours are 8:00 a.m. to 4:30 p.m. Monday - Friday. Please note that voicemails left after 4:00 p.m. may not be returned until the following business day.  We are closed weekends and major holidays. You have access to a nurse at all times for urgent questions. Please call the main number to the clinic Dept: [phone] and follow the prompts.   For any non-urgent questions, you may also contact your provider using MyChart. We now offer e-Visits for anyone 48 and older to request care online for non-urgent symptoms. For details visit [email or web address].   Also download the MyChart app! Go to the app store, search "MyChart", open the app, select [place], and log in with your MyChart username and password.

## 2024-01-07 NOTE — Progress Notes (Unsigned)
 [facility] Cancer Follow up:    Thurmond Cathlyn LABOR., MD [address] [place] [address]   DIAGNOSIS:  Cancer Staging  Malignant neoplasm of upper-outer quadrant of right breast in female, estrogen receptor negative (HCC) Staging form: Breast, AJCC 8th Edition - Clinical stage from 08/29/2023: Stage IB (cT1c, cN0, cM0, G3, ER-, PR-, HER2-) - Signed by Loretha Ash, MD on 08/29/2023 Stage prefix: Initial diagnosis Histologic grading system: 3 grade system    SUMMARY OF ONCOLOGIC HISTORY: Oncology History  Malignant neoplasm of upper-outer quadrant of right breast in female, estrogen receptor negative (HCC)  08/28/2023 Initial Diagnosis   Malignant neoplasm of upper-outer quadrant of right breast in female, estrogen receptor negative (HCC)    Mammogram   Highly suspicious 1.8 cm palpable mass right breast 10 o'clock position. Highly suspicious 0.9 cm mass noted adjacent to the dominant palpable mass at the 10 o'clock position. The distance between the 2 masses measures less than 1 cm. Indeterminate grouped calcifications in the central outer right breast at posterior depth.    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  Report date is 09/12/2023.   The CancerNext-Expanded gene panel offered by [facility] and includes sequencing, rearrangement, and RNA analysis for the following 76 genes: AIP, ALK, APC, ATM, AXIN2, BAP1, BARD1, BMPR1A, BRCA1, BRCA2, BRIP1, CDC73, CDH1, CDK4, CDKN1B, CDKN2A, CEBPA, CHEK2,  CTNNA1, DDX41, DICER1, ETV6, FH, FLCN, GATA2, LZTR1, MAX, MBD4, MEN1, MET, MLH1, MSH2, MSH3, MSH6, MUTYH, NF1, NF2, NTHL1, PALB2, PHOX2B, PMS2, POT1, PRKAR1A, PTCH1, PTEN, RAD51C, RAD51D, RB1, RET, RUNX1, SDHA, SDHAF2, SDHB, SDHC, SDHD, SMAD4, SMARCA4, SMARCB1, SMARCE1, STK11, SUFU, TMEM127, TP53, TSC1, TSC2, VHL, and WT1 (sequencing and deletion/duplication); EGFR, HOXB13, KIT, MITF, PDGFRA, POLD1, and POLE (sequencing only); EPCAM and GREM1 (deletion/duplication only).    09/13/2023 Definitive Surgery   Right breast lumpectomy, foci of IDC discontinuously involving a fibrotic area of 1.8 cms, grade 3, extensive cancerization of lobules, DCIS high grade, neg margins,  Rt axillary sentinel LN negative. Additional medial margin ( deep resection margin pos for DCIS)   11/26/2023 -  Chemotherapy   Patient is on Treatment Plan : BREAST TC q21d       CURRENT THERAPY: Taxotere /Cytoxan   INTERVAL HISTORY:  Discussed the use of AI scribe software for clinical note transcription with the patient, who gave verbal consent to proceed.  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 She is currently undergoing adjuvant chemotherapy with Taxotere  and Cytoxan  and is here for her third cycle.  She experiences swelling in her feet, legs, and ankles, which she attributes to attending several ball games in hot weather. She has been checked for lymphedema at cancer rehab and uses medical-grade compression stockings due to a previous metatarsal fracture and bone injury that resulted in numb toes.  She notes improvement since elevating her legs over the past few days.  She recently had a urinary tract infection treated with a prescription. She occasionally  experiences burning with urination, but it is not severe.    Patient Active Problem List   Diagnosis Date Noted   Port-A-Cath in place 11/26/2023   Genetic testing 09/25/2023   Malignant neoplasm of upper-outer quadrant of right breast in female, estrogen receptor negative (HCC) 08/28/2023    is allergic to nitrates, organic and oxycodone .  MEDICAL HISTORY: Past Medical History:  Diagnosis Date   Age-related macular degeneration    Bunion of right foot 08/2017   Cancer (HCC) 08/2023   right breast ICD with DCIS   Complication of anesthesia    hard to wake up from colonoscopy when Propofol  not used   Dental crowns present    Metatarsalgia of right foot 08/2017   2nd toe   Migraines    with change of weather   TMJ syndrome     SURGICAL HISTORY: Past Surgical History:  Procedure Laterality Date   APPENDECTOMY  1960   AXILLARY SENTINEL NODE BIOPSY Right 09/13/2023   Procedure: BIOPSY, LYMPH NODE, SENTINEL, AXILLARY;  Surgeon: Ebbie Cough, MD;  Location: [facility];  Service: General;  Laterality: Right;  right   BREAST BIOPSY Right 08/23/2023   US  RT BREAST BX W LOC DEV 1ST LESION IMG BX SPEC US  GUIDE 08/23/2023 [facility]   BREAST BIOPSY Right 08/23/2023   US  RT BREAST BX W LOC DEV EA ADD LESION IMG BX SPEC US  GUIDE 08/23/2023 [facility]   BREAST BIOPSY Right 08/23/2023   MM RT BREAST BX W LOC DEV 1ST LESION IMAGE BX SPEC STEREO GUIDE 08/23/2023 [facility]   BREAST BIOPSY  09/11/2023   MM RT RADIOACTIVE SEED EA ADD LESION LOC MAMMO GUIDE 09/11/2023 [facility]   BREAST BIOPSY  09/11/2023   MM RT RADIOACTIVE SEED EA ADD LESION LOC MAMMO GUIDE 09/11/2023 [facility]   BREAST BIOPSY  09/11/2023   MM RT RADIOACTIVE SEED LOC MAMMO GUIDE 09/11/2023 [facility]   BREAST LUMPECTOMY Right 10/08/2023   Procedure: BREAST LUMPECTOMY;  Surgeon: Ebbie Cough, MD;  Location: [facility];  Service: General;  Laterality:  Right;  RIGHT BREAST MARGIN RE-EXCISION   BREAST LUMPECTOMY Right 11/06/2023   Procedure: RIGHT BREAST RE-EXCISION LUMPECTOMY;  Surgeon: Ebbie Cough, MD;  Location: [facility];  Service: General;  Laterality: Right;   BREAST LUMPECTOMY WITH RADIOACTIVE SEED LOCALIZATION Right 09/13/2023   Procedure: BREAST LUMPECTOMY WITH RADIOACTIVE SEED LOCALIZATION;  Surgeon: Ebbie Cough, MD;  Location: [facility];  Service: General;  Laterality: Right;  Right breast radioactive seed bracketed lumpectomy   BUNIONECTOMY WITH WEIL OSTEOTOMY Right 09/06/2017   Procedure: Right Lapidus, Modified Woodard Edwards;  Surgeon: Kit Rush, MD;  Location: [facility];  Service: Orthopedics;  Laterality: Right;   COLONOSCOPY WITH PROPOFOL   08/08/2016   HAMMERTOE RECONSTRUCTION WITH WEIL OSTEOTOMY  06/27/2018   Procedure: LEFT SECOND HAMMERTOE RECONSTRUCTION WITH WEIL OSTEOTOMY;  Surgeon: Kit Rush, MD;  Location:  SURGERY CENTER;  Service: Orthopedics;;   PORTACATH PLACEMENT Right 09/13/2023   Procedure: INSERTION, TUNNELED CENTRAL VENOUS DEVICE, WITH PORT;  Surgeon: Ebbie Cough, MD;  Location:  SURGERY CENTER;  Service: General;  Laterality: Right;  GEN w/PEC BLOCK   TARSAL METATARSAL ARTHRODESIS Left 06/27/2018   Procedure: Left modified McBride and lapidus 1st tarsometatarsal arthrodesis;  Surgeon: Kit Rush, MD;  Location:  [facility];  Service: Orthopedics;  Laterality: Left;    TONSILLECTOMY AND ADENOIDECTOMY  1954    SOCIAL HISTORY: Social History   Socioeconomic History   Marital status: Married    Spouse name: Not on file   Number of children: Not on file   Years of education: Not on file   Highest education level: Not on file  Occupational History   Not on file  Tobacco Use   Smoking status: Never   Smokeless tobacco: Never  Vaping Use   Vaping status: Never Used  Substance and Sexual  Activity   Alcohol  use: Not Currently   Drug use: No   Sexual activity: Not Currently    Birth control/protection: Post-menopausal  Other Topics Concern   Not on file  Social History Narrative   Not on file   Social Drivers of Health   Financial Resource Strain: Not on file  Food Insecurity: Low Risk  (11/12/2023)   Received from [facility]   Hunger Vital Sign    Within the past 12 months, you worried that your food would run out before you got money to buy more: Never true    Within the past 12 months, the food you bought just didn't last and you didn't have money to get more. : Never true  Transportation Needs: No Transportation Needs (11/12/2023)   Received from [facility]    In the past 12 months, has lack of reliable transportation kept you from medical appointments, meetings, work or from getting things needed for daily living? : No  Physical Activity: Not on file  Stress: Not on file  Social Connections: Not on file  Intimate Partner Violence: Not At Risk (08/29/2023)   Humiliation, Afraid, Rape, and Kick questionnaire    Fear of Current or Ex-Partner: No    Emotionally Abused: No    Physically Abused: No    Sexually Abused: No    FAMILY HISTORY: Family History  Problem Relation Age of Onset   Colon polyps Mother    Lung cancer Mother        d. 67   Colon polyps Father    Melanoma Father    Colon cancer Brother        and polyps; dx 44s   Lung cancer Maternal Aunt    Lung cancer Maternal Grandfather    Breast cancer Niece        dx 66s    Review of Systems  Constitutional:  Negative for appetite change, chills, fatigue, fever and unexpected weight change.  HENT:   Negative for hearing loss, lump/mass and trouble swallowing.   Eyes:  Negative for eye problems and icterus.  Respiratory:  Negative for chest tightness, cough and shortness of breath.   Cardiovascular:  Positive for leg swelling. Negative for chest pain and palpitations.   Gastrointestinal:  Negative for abdominal distention, abdominal pain, constipation, diarrhea, nausea and vomiting.  Endocrine: Negative for hot flashes.  Genitourinary:  Positive for dysuria. Negative for bladder incontinence, difficulty urinating, frequency and hematuria.   Musculoskeletal:  Negative for arthralgias.  Skin:  Negative for itching and rash.  Neurological:  Negative for dizziness, extremity weakness, headaches and numbness.  Hematological:  Negative for adenopathy. Does not bruise/bleed easily.  Psychiatric/Behavioral:  Negative for depression. The patient is not nervous/anxious.       PHYSICAL EXAMINATION    Vitals:   01/07/24 1413  BP: (!) 129/52  Pulse: 77  Resp: 18  Temp:  98 F (36.7 C)  SpO2: 100%    Physical Exam Constitutional:      General: She is not in acute distress.    Appearance: Normal appearance. She is not toxic-appearing.  HENT:     Head: Normocephalic and atraumatic.     Mouth/Throat:     Mouth: Mucous membranes are moist.     Pharynx: Oropharynx is clear. No oropharyngeal exudate or posterior oropharyngeal erythema.  Eyes:     General: No scleral icterus. Cardiovascular:     Rate and Rhythm: Normal rate and regular rhythm.     Pulses: Normal pulses.     Heart sounds: Normal heart sounds.  Pulmonary:     Effort: Pulmonary effort is normal.     Breath sounds: Normal breath sounds.  Abdominal:     General: Abdomen is flat. Bowel sounds are normal. There is no distension.     Palpations: Abdomen is soft.     Tenderness: There is no abdominal tenderness.  Musculoskeletal:        General: No swelling.     Cervical back: Neck supple.  Lymphadenopathy:     Cervical: No cervical adenopathy.  Skin:    General: Skin is warm and dry.     Findings: No rash.  Neurological:     General: No focal deficit present.     Mental Status: She is alert.  Psychiatric:        Mood and Affect: Mood normal.        Behavior: Behavior normal.      LABORATORY DATA:  CBC    Component Value Date/Time   WBC 9.4 01/07/2024 1339   RBC 3.40 (L) 01/07/2024 1339   HGB 9.8 (L) 01/07/2024 1339   HCT 29.4 (L) 01/07/2024 1339   PLT 329 01/07/2024 1339   MCV 86.5 01/07/2024 1339   MCH 28.8 01/07/2024 1339   MCHC 33.3 01/07/2024 1339   RDW 16.1 (H) 01/07/2024 1339   LYMPHSABS 1.5 01/07/2024 1339   MONOABS 1.0 01/07/2024 1339   EOSABS 0.0 01/07/2024 1339   BASOSABS 0.0 01/07/2024 1339    CMP     Component Value Date/Time   NA 138 12/17/2023 0853   K 4.1 12/17/2023 0853   CL 106 12/17/2023 0853   CO2 26 12/17/2023 0853   GLUCOSE 73 12/17/2023 0853   BUN 11 12/17/2023 0853   CREATININE 0.60 12/17/2023 0853   CALCIUM 8.7 (L) 12/17/2023 0853   PROT 6.3 (L) 12/17/2023 0853   ALBUMIN 4.0 12/17/2023 0853   AST 15 12/17/2023 0853   ALT 21 12/17/2023 0853   ALKPHOS 75 12/17/2023 0853   BILITOT 0.4 12/17/2023 0853   GFRNONAA >60 12/17/2023 0853     ASSESSMENT and THERAPY PLAN:   Assessment and Plan Assessment & Plan Stage 1B triple negative breast cancer Diagnosed in March 2025, post right lumpectomy, on adjuvant chemotherapy with Taxotere  and Cytoxan . Presenting for cycle three. No significant chemotherapy issues, monitoring for side effects. Labs within parameters for treatment. - Administer cycle number three of Taxotere  and Cytoxan .  Leg Swelling Swelling in feet, legs, and ankles, likely exacerbated by prolonged sitting and hot weather. Evaluated by cancer rehab specialist. Advised on management strategies. - Advise use of compression stockings and elevation of legs. - Monitor sodium intake.  Anemia Mild anemia with hemoglobin at 9.8, likely chemotherapy-induced. Normal red blood cell size, no iron deficiency. - Monitor hemoglobin levels. - Encourage dietary intake of potassium-rich foods.  Urinary tract infection Recent burning symptoms. Previous  UTI treated with antibiotics, no prior urine test. Plan to test  urine for infection clearance and antibiotic resistance. - Order urinalysis and culture. - Advise on hydration and bladder habits.   RTC in 3 weeks for labs, f/u, and cycle 4 of Taxotere /Cytoxan .   All questions were answered. The patient knows to call the clinic with any problems, questions or concerns. We can certainly see the patient much sooner if necessary.  Total encounter time: 20 minutes*in face-to-face visit time, chart review, lab review, care coordination, order entry, and documentation of the encounter time.    Morna Kendall, NP 01/07/24 2:20 PM Medical Oncology and Hematology [facility] [address] [place], [place] [address] Tel. [phone]    Fax. [phone]  *Total Encounter Time as defined by the Centers for Medicare and Medicaid Services includes, in addition to the face-to-face time of a patient visit (documented in the note above) non-face-to-face time: obtaining and reviewing outside history, ordering and reviewing medications, tests or procedures, care coordination (communications with other health care professionals or caregivers) and documentation in the medical record.

## 2024-01-08 ENCOUNTER — Ambulatory Visit: Payer: Self-pay | Admitting: *Deleted

## 2024-01-08 ENCOUNTER — Other Ambulatory Visit: Payer: Self-pay | Admitting: Adult Health

## 2024-01-08 ENCOUNTER — Encounter: Payer: Self-pay | Admitting: Hematology and Oncology

## 2024-01-08 DIAGNOSIS — N390 Urinary tract infection, site not specified: Secondary | ICD-10-CM

## 2024-01-08 MED ORDER — NITROFURANTOIN MONOHYD MACRO 100 MG PO CAPS
100.0000 mg | ORAL_CAPSULE | Freq: Two times a day (BID) | ORAL | 0 refills | Status: AC
Start: 2024-01-08 — End: 2024-01-15

## 2024-01-08 NOTE — Telephone Encounter (Signed)
Per Lindsey Causey, NP, called pt with message below. Pt verbalized understanding.  

## 2024-01-08 NOTE — Telephone Encounter (Signed)
-----   Message from Morna JAYSON Kendall sent at 01/08/2024 12:39 PM EDT ----- Urine is still infected.  Please let her know I sent new antibiotic to pharmacy ----- Message ----- From: Interface, Lab In [place] Sent: 01/07/2024   3:49 PM EDT To: Morna Dalton Kendall, NP

## 2024-01-09 ENCOUNTER — Inpatient Hospital Stay

## 2024-01-09 VITALS — BP 113/59 | Temp 98.5°F | Resp 18

## 2024-01-09 DIAGNOSIS — Z171 Estrogen receptor negative status [ER-]: Secondary | ICD-10-CM

## 2024-01-09 DIAGNOSIS — C50411 Malignant neoplasm of upper-outer quadrant of right female breast: Secondary | ICD-10-CM | POA: Diagnosis not present

## 2024-01-09 LAB — URINE CULTURE: Culture: >=100000 — AB

## 2024-01-09 MED ORDER — PEGFILGRASTIM-JMDB 6 MG/0.6ML ~~LOC~~ SOSY
6.0000 mg | PREFILLED_SYRINGE | Freq: Once | SUBCUTANEOUS | Status: AC
Start: 2024-01-09 — End: 2024-01-09
  Administered 2024-01-09: 6 mg via SUBCUTANEOUS
  Filled 2024-01-09: qty 0.6

## 2024-01-10 ENCOUNTER — Ambulatory Visit

## 2024-01-28 ENCOUNTER — Encounter: Payer: Self-pay | Admitting: *Deleted

## 2024-01-28 ENCOUNTER — Inpatient Hospital Stay: Admitting: Hematology and Oncology

## 2024-01-28 ENCOUNTER — Other Ambulatory Visit: Payer: Self-pay | Admitting: *Deleted

## 2024-01-28 ENCOUNTER — Telehealth: Payer: Self-pay | Admitting: *Deleted

## 2024-01-28 ENCOUNTER — Inpatient Hospital Stay

## 2024-01-28 ENCOUNTER — Inpatient Hospital Stay: Attending: Hematology and Oncology

## 2024-01-28 VITALS — BP 120/52 | HR 86 | Temp 98.4°F | Resp 18 | Wt 120.6 lb

## 2024-01-28 DIAGNOSIS — Z1722 Progesterone receptor negative status: Secondary | ICD-10-CM | POA: Insufficient documentation

## 2024-01-28 DIAGNOSIS — Z800 Family history of malignant neoplasm of digestive organs: Secondary | ICD-10-CM | POA: Insufficient documentation

## 2024-01-28 DIAGNOSIS — Z801 Family history of malignant neoplasm of trachea, bronchus and lung: Secondary | ICD-10-CM | POA: Insufficient documentation

## 2024-01-28 DIAGNOSIS — Z5189 Encounter for other specified aftercare: Secondary | ICD-10-CM | POA: Insufficient documentation

## 2024-01-28 DIAGNOSIS — Z1732 Human epidermal growth factor receptor 2 negative status: Secondary | ICD-10-CM | POA: Diagnosis not present

## 2024-01-28 DIAGNOSIS — Z171 Estrogen receptor negative status [ER-]: Secondary | ICD-10-CM

## 2024-01-28 DIAGNOSIS — T451X5A Adverse effect of antineoplastic and immunosuppressive drugs, initial encounter: Secondary | ICD-10-CM | POA: Diagnosis not present

## 2024-01-28 DIAGNOSIS — Z803 Family history of malignant neoplasm of breast: Secondary | ICD-10-CM | POA: Insufficient documentation

## 2024-01-28 DIAGNOSIS — D6481 Anemia due to antineoplastic chemotherapy: Secondary | ICD-10-CM | POA: Diagnosis not present

## 2024-01-28 DIAGNOSIS — Z95828 Presence of other vascular implants and grafts: Secondary | ICD-10-CM

## 2024-01-28 DIAGNOSIS — R432 Parageusia: Secondary | ICD-10-CM

## 2024-01-28 DIAGNOSIS — C50411 Malignant neoplasm of upper-outer quadrant of right female breast: Secondary | ICD-10-CM | POA: Diagnosis not present

## 2024-01-28 DIAGNOSIS — Z5111 Encounter for antineoplastic chemotherapy: Secondary | ICD-10-CM | POA: Insufficient documentation

## 2024-01-28 LAB — CBC WITH DIFFERENTIAL (CANCER CENTER ONLY)
Abs Immature Granulocytes: 0.07 K/uL (ref 0.00–0.07)
Basophils Absolute: 0 K/uL (ref 0.0–0.1)
Basophils Relative: 0 %
Eosinophils Absolute: 0 K/uL (ref 0.0–0.5)
Eosinophils Relative: 0 %
HCT: 28.5 % — ABNORMAL LOW (ref 36.0–46.0)
Hemoglobin: 9.4 g/dL — ABNORMAL LOW (ref 12.0–15.0)
Immature Granulocytes: 1 %
Lymphocytes Relative: 8 %
Lymphs Abs: 0.8 K/uL (ref 0.7–4.0)
MCH: 29.3 pg (ref 26.0–34.0)
MCHC: 33 g/dL (ref 30.0–36.0)
MCV: 88.8 fL (ref 80.0–100.0)
Monocytes Absolute: 0.4 K/uL (ref 0.1–1.0)
Monocytes Relative: 4 %
Neutro Abs: 8 K/uL — ABNORMAL HIGH (ref 1.7–7.7)
Neutrophils Relative %: 87 %
Platelet Count: 356 K/uL (ref 150–400)
RBC: 3.21 MIL/uL — ABNORMAL LOW (ref 3.87–5.11)
RDW: 17.9 % — ABNORMAL HIGH (ref 11.5–15.5)
WBC Count: 9.2 K/uL (ref 4.0–10.5)
nRBC: 0 % (ref 0.0–0.2)

## 2024-01-28 LAB — CMP (CANCER CENTER ONLY)
ALT: 17 U/L (ref 0–44)
AST: 14 U/L — ABNORMAL LOW (ref 15–41)
Albumin: 3.8 g/dL (ref 3.5–5.0)
Alkaline Phosphatase: 75 U/L (ref 38–126)
Anion gap: 9 (ref 5–15)
BUN: 16 mg/dL (ref 8–23)
CO2: 22 mmol/L (ref 22–32)
Calcium: 8.5 mg/dL — ABNORMAL LOW (ref 8.9–10.3)
Chloride: 109 mmol/L (ref 98–111)
Creatinine: 0.67 mg/dL (ref 0.44–1.00)
GFR, Estimated: >60 mL/min (ref >60)
Glucose, Bld: 167 mg/dL — ABNORMAL HIGH (ref 70–99)
Potassium: 3.8 mmol/L (ref 3.5–5.1)
Sodium: 140 mmol/L (ref 135–145)
Total Bilirubin: 0.3 mg/dL (ref 0.0–1.2)
Total Protein: 6.1 g/dL — ABNORMAL LOW (ref 6.5–8.1)

## 2024-01-28 MED ORDER — SODIUM CHLORIDE 0.9% FLUSH
10.0000 mL | Freq: Once | INTRAVENOUS | Status: AC
Start: 2024-01-28 — End: 2024-01-28
  Administered 2024-01-28 (×2): 10 mL

## 2024-01-28 MED ORDER — DEXAMETHASONE SODIUM PHOSPHATE 10 MG/ML IJ SOLN
10.0000 mg | Freq: Once | INTRAMUSCULAR | Status: AC
  Administered 2024-01-28 (×2): 10 mg via INTRAVENOUS
  Filled 2024-01-28: qty 1

## 2024-01-28 MED ORDER — PALONOSETRON HCL INJECTION 0.25 MG/5ML
0.2500 mg | Freq: Once | INTRAVENOUS | Status: AC
  Administered 2024-01-28 (×2): 0.25 mg via INTRAVENOUS
  Filled 2024-01-28: qty 5

## 2024-01-28 MED ORDER — SODIUM CHLORIDE 0.9 % IV SOLN
75.0000 mg/m2 | Freq: Once | INTRAVENOUS | Status: AC
  Administered 2024-01-28 (×2): 115 mg via INTRAVENOUS
  Filled 2024-01-28: qty 11.5

## 2024-01-28 MED ORDER — SODIUM CHLORIDE 0.9 % IV SOLN
600.0000 mg/m2 | Freq: Once | INTRAVENOUS | Status: AC
  Administered 2024-01-28 (×2): 1000 mg via INTRAVENOUS
  Filled 2024-01-28: qty 50

## 2024-01-28 MED ORDER — SODIUM CHLORIDE 0.9 % IV SOLN: INTRAVENOUS | Status: DC

## 2024-01-28 NOTE — Progress Notes (Signed)
 [facility] Cancer Follow up:    Heather Lewis LABOR., MD [address] [place] [address]   DIAGNOSIS:  Cancer Staging  Malignant neoplasm of upper-outer quadrant of right breast in female, estrogen receptor negative (HCC) Staging form: Breast, AJCC 8th Edition - Clinical stage from 08/29/2023: Stage IB (cT1c, cN0, cM0, G3, ER-, PR-, HER2-) - Signed by Loretha Ash, MD on 08/29/2023 Stage prefix: Initial diagnosis Histologic grading system: 3 grade system    SUMMARY OF ONCOLOGIC HISTORY: Oncology History  Malignant neoplasm of upper-outer quadrant of right breast in female, estrogen receptor negative (HCC)  08/28/2023 Initial Diagnosis   Malignant neoplasm of upper-outer quadrant of right breast in female, estrogen receptor negative (HCC)    Mammogram   Highly suspicious 1.8 cm palpable mass right breast 10 o'clock position. Highly suspicious 0.9 cm mass noted adjacent to the dominant palpable mass at the 10 o'clock position. The distance between the 2 masses measures less than 1 cm. Indeterminate grouped calcifications in the central outer right breast at posterior depth.    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  Report date is 09/12/2023.   The CancerNext-Expanded gene panel offered by [facility] and includes sequencing, rearrangement, and RNA analysis for the following 76 genes: AIP, ALK, APC, ATM, AXIN2, BAP1, BARD1, BMPR1A, BRCA1, BRCA2, BRIP1, CDC73, CDH1, CDK4, CDKN1B, CDKN2A, CEBPA, CHEK2,  CTNNA1, DDX41, DICER1, ETV6, FH, FLCN, GATA2, LZTR1, MAX, MBD4, MEN1, MET, MLH1, MSH2, MSH3, MSH6, MUTYH, NF1, NF2, NTHL1, PALB2, PHOX2B, PMS2, POT1, PRKAR1A, PTCH1, PTEN, RAD51C, RAD51D, RB1, RET, RUNX1, SDHA, SDHAF2, SDHB, SDHC, SDHD, SMAD4, SMARCA4, SMARCB1, SMARCE1, STK11, SUFU, TMEM127, TP53, TSC1, TSC2, VHL, and WT1 (sequencing and deletion/duplication); EGFR, HOXB13, KIT, MITF, PDGFRA, POLD1, and POLE (sequencing only); EPCAM and GREM1 (deletion/duplication only).    09/13/2023 Definitive Surgery   Right breast lumpectomy, foci of IDC discontinuously involving a fibrotic area of 1.8 cms, grade 3, extensive cancerization of lobules, DCIS high grade, neg margins,  Rt axillary sentinel LN negative. Additional medial margin ( deep resection margin pos for DCIS)   11/26/2023 -  Chemotherapy   Patient is on Treatment Plan : BREAST TC q21d       CURRENT THERAPY: Taxotere /Cytoxan   INTERVAL HISTORY:  Discussed the use of AI scribe software for clinical note transcription with the patient, who gave verbal consent to proceed.  History of Present Illness  Heather Lewis is a 78 year old female with breast cancer who presents for follow-up after chemotherapy treatment.  She has recently completed her chemotherapy treatment for breast cancer. During her treatment, she experienced a urinary tract infection that was treated with Bactrim, and the symptoms have since resolved.  She describes her arms feeling heavy, particularly when lifting objects such as hymnals, although she is able to perform these tasks with effort. There is no tingling or numbness in her hands, but she reports numbness in her toes, which was present before her chemotherapy treatment.  She has developed a rash with small, itchy spots on her arms. Additionally, she reports a  small stye on the side of her lower eye lid, which is not causing significant discomfort.  Her feet are swollen. She denies any mouth sores but notes a loss of  taste, stating 'nothing has a taste to it.' Her appetite remains unchanged, and she denies any fevers or chills. She is currently taking iron supplements.    Patient Active Problem List   Diagnosis Date Noted   Port-A-Cath in place 11/26/2023   Genetic testing 09/25/2023   Malignant neoplasm of upper-outer quadrant of right breast in female, estrogen receptor negative (HCC) 08/28/2023    is allergic to nitrates, organic and oxycodone .  MEDICAL HISTORY: Past Medical History:  Diagnosis Date   Age-related macular degeneration    Bunion of right foot 08/2017   Cancer (HCC) 08/2023   right breast ICD with DCIS   Complication of anesthesia    hard to wake up from colonoscopy when Propofol  not used   Dental crowns present    Metatarsalgia of right foot 08/2017   2nd toe   Migraines    with change of weather   TMJ syndrome     SURGICAL HISTORY: Past Surgical History:  Procedure Laterality Date   APPENDECTOMY  1960   AXILLARY SENTINEL NODE BIOPSY Right 09/13/2023   Procedure: BIOPSY, LYMPH NODE, SENTINEL, AXILLARY;  Surgeon: Ebbie Cough, MD;  Location: [facility];  Service: General;  Laterality: Right;  right   BREAST BIOPSY Right 08/23/2023   US  RT BREAST BX W LOC DEV 1ST LESION IMG BX SPEC US  GUIDE 08/23/2023 [facility]   BREAST BIOPSY Right 08/23/2023   US  RT BREAST BX W LOC DEV EA ADD LESION IMG BX SPEC US  GUIDE 08/23/2023 [facility]   BREAST BIOPSY Right 08/23/2023   MM RT BREAST BX W LOC DEV 1ST LESION IMAGE BX SPEC STEREO GUIDE 08/23/2023 [facility]   BREAST BIOPSY  09/11/2023   MM RT RADIOACTIVE SEED EA ADD LESION LOC MAMMO GUIDE 09/11/2023 [facility]   BREAST BIOPSY  09/11/2023   MM RT RADIOACTIVE SEED EA ADD LESION LOC MAMMO GUIDE 09/11/2023 [facility]   BREAST BIOPSY  09/11/2023   MM RT RADIOACTIVE SEED LOC MAMMO GUIDE 09/11/2023 [facility]   BREAST LUMPECTOMY Right 10/08/2023   Procedure: BREAST LUMPECTOMY;   Surgeon: Ebbie Cough, MD;  Location: [facility];  Service: General;  Laterality: Right;  RIGHT BREAST MARGIN RE-EXCISION   BREAST LUMPECTOMY Right 11/06/2023   Procedure: RIGHT BREAST RE-EXCISION LUMPECTOMY;  Surgeon: Ebbie Cough, MD;  Location: [facility];  Service: General;  Laterality: Right;   BREAST LUMPECTOMY WITH RADIOACTIVE SEED LOCALIZATION Right 09/13/2023   Procedure: BREAST LUMPECTOMY WITH RADIOACTIVE SEED LOCALIZATION;  Surgeon: Ebbie Cough, MD;  Location: [facility];  Service: General;  Laterality: Right;  Right breast radioactive seed bracketed lumpectomy   BUNIONECTOMY WITH WEIL OSTEOTOMY Right 09/06/2017   Procedure: Right Lapidus, Modified Woodard Edwards;  Surgeon: Kit Rush, MD;  Location: [facility];  Service: Orthopedics;  Laterality: Right;   COLONOSCOPY WITH PROPOFOL   08/08/2016   HAMMERTOE RECONSTRUCTION WITH WEIL OSTEOTOMY  06/27/2018   Procedure: LEFT SECOND HAMMERTOE RECONSTRUCTION WITH WEIL OSTEOTOMY;  Surgeon: Kit Rush, MD;  Location: [facility];  Service: Orthopedics;;   PORTACATH PLACEMENT Right 09/13/2023   Procedure: INSERTION, TUNNELED CENTRAL VENOUS DEVICE, WITH PORT;  Surgeon: Ebbie Cough, MD;  Location:  SURGERY CENTER;  Service: General;  Laterality: Right;  GEN w/PEC BLOCK   TARSAL METATARSAL  ARTHRODESIS Left 06/27/2018   Procedure: Left modified McBride and lapidus 1st tarsometatarsal arthrodesis;  Surgeon: Kit Rush, MD;  Location: [facility];  Service: Orthopedics;  Laterality: Left;    TONSILLECTOMY AND ADENOIDECTOMY  1954    SOCIAL HISTORY: Social History   Socioeconomic History   Marital status: Married    Spouse name: Not on file   Number of children: Not on file   Years of education: Not on file   Highest education level: Not on file  Occupational History   Not on file  Tobacco Use   Smoking status:  Never   Smokeless tobacco: Never  Vaping Use   Vaping status: Never Used  Substance and Sexual Activity   Alcohol  use: Not Currently   Drug use: No   Sexual activity: Not Currently    Birth control/protection: Post-menopausal  Other Topics Concern   Not on file  Social History Narrative   Not on file   Social Drivers of Health   Financial Resource Strain: Not on file  Food Insecurity: Low Risk  (11/12/2023)   Received from [facility]   Hunger Vital Sign    Within the past 12 months, you worried that your food would run out before you got money to buy more: Never true    Within the past 12 months, the food you bought just didn't last and you didn't have money to get more. : Never true  Transportation Needs: No Transportation Needs (11/12/2023)   Received from [facility]    In the past 12 months, has lack of reliable transportation kept you from medical appointments, meetings, work or from getting things needed for daily living? : No  Physical Activity: Not on file  Stress: Not on file  Social Connections: Not on file  Intimate Partner Violence: Not At Risk (08/29/2023)   Humiliation, Afraid, Rape, and Kick questionnaire    Fear of Current or Ex-Partner: No    Emotionally Abused: No    Physically Abused: No    Sexually Abused: No    FAMILY HISTORY: Family History  Problem Relation Age of Onset   Colon polyps Mother    Lung cancer Mother        d. 58   Colon polyps Father    Melanoma Father    Colon cancer Brother        and polyps; dx 52s   Lung cancer Maternal Aunt    Lung cancer Maternal Grandfather    Breast cancer Niece        dx 43s    Review of Systems  Constitutional:  Negative for appetite change, chills, fatigue, fever and unexpected weight change.  HENT:   Negative for hearing loss, lump/mass and trouble swallowing.   Eyes:  Negative for eye problems and icterus.  Respiratory:  Negative for chest tightness, cough and shortness of  breath.   Cardiovascular:  Positive for leg swelling. Negative for chest pain and palpitations.  Gastrointestinal:  Negative for abdominal distention, abdominal pain, constipation, diarrhea, nausea and vomiting.  Endocrine: Negative for hot flashes.  Genitourinary:  Negative for bladder incontinence, difficulty urinating, dysuria, frequency and hematuria.   Musculoskeletal:  Negative for arthralgias.  Skin:  Negative for itching and rash.  Neurological:  Negative for dizziness, extremity weakness, headaches and numbness.  Hematological:  Negative for adenopathy. Does not bruise/bleed easily.  Psychiatric/Behavioral:  Negative for depression. The patient is not nervous/anxious.       PHYSICAL EXAMINATION  Vitals:   01/28/24 0912  BP: (!) 120/52  Pulse: 86  Resp: 18  Temp: 98.4 F (36.9 C)  SpO2: 100%    Physical Exam Constitutional:      General: She is not in acute distress.    Appearance: Normal appearance. She is not toxic-appearing.  HENT:     Head: Normocephalic and atraumatic.     Mouth/Throat:     Mouth: Mucous membranes are moist.     Pharynx: Oropharynx is clear. No oropharyngeal exudate or posterior oropharyngeal erythema.  Eyes:     General: No scleral icterus. Cardiovascular:     Rate and Rhythm: Normal rate and regular rhythm.     Pulses: Normal pulses.     Heart sounds: Normal heart sounds.  Pulmonary:     Effort: Pulmonary effort is normal.     Breath sounds: Normal breath sounds.  Abdominal:     General: Abdomen is flat. Bowel sounds are normal. There is no distension.     Palpations: Abdomen is soft.     Tenderness: There is no abdominal tenderness.  Musculoskeletal:        General: No swelling.     Cervical back: Neck supple.  Lymphadenopathy:     Cervical: No cervical adenopathy.  Skin:    General: Skin is warm and dry.     Findings: No rash.  Neurological:     General: No focal deficit present.     Mental Status: She is alert.   Psychiatric:        Mood and Affect: Mood normal.        Behavior: Behavior normal.     LABORATORY DATA:  CBC    Component Value Date/Time   WBC 9.2 01/28/2024 0850   RBC 3.21 (L) 01/28/2024 0850   HGB 9.4 (L) 01/28/2024 0850   HCT 28.5 (L) 01/28/2024 0850   PLT 356 01/28/2024 0850   MCV 88.8 01/28/2024 0850   MCH 29.3 01/28/2024 0850   MCHC 33.0 01/28/2024 0850   RDW 17.9 (H) 01/28/2024 0850   LYMPHSABS 0.8 01/28/2024 0850   MONOABS 0.4 01/28/2024 0850   EOSABS 0.0 01/28/2024 0850   BASOSABS 0.0 01/28/2024 0850    CMP     Component Value Date/Time   NA 136 01/07/2024 1339   K 3.3 (L) 01/07/2024 1339   CL 105 01/07/2024 1339   CO2 24 01/07/2024 1339   GLUCOSE 142 (H) 01/07/2024 1339   BUN 17 01/07/2024 1339   CREATININE 0.74 01/07/2024 1339   CALCIUM 9.1 01/07/2024 1339   PROT 6.3 (L) 01/07/2024 1339   ALBUMIN 3.9 01/07/2024 1339   AST 17 01/07/2024 1339   ALT 26 01/07/2024 1339   ALKPHOS 75 01/07/2024 1339   BILITOT 0.4 01/07/2024 1339   GFRNONAA >60 01/07/2024 1339     ASSESSMENT and THERAPY PLAN:   Assessment and Plan Assessment & Plan Stage 1B triple negative breast cancer Diagnosed in March 2025, post right lumpectomy, on adjuvant chemotherapy with Taxotere  and Cytoxan .  She will be getting her C4 today. Discussed potential side effects of radiation including fatigue and skin changes - Refer to [facility] for radiation therapy. - Plan to see her after radiation therapy in 8-10 weeks.  Chemotherapy-induced anemia Anemia likely due to chemotherapy-induced bone marrow suppression. Current blood counts show anemia, expected to recover spontaneously. Iron supplements may help, though anemia is not necessarily due to iron deficiency.  Chemotherapy-induced peripheral edema, muscle weakness, fatigue, rash, and taste disturbance Peripheral edema, muscle weakness,  fatigue, rash, and taste disturbance attributed to chemotherapy. Symptoms expected to improve  about a month post-chemotherapy. Discussed common post-chemotherapy effects including muscle weakness and leg swelling. Taste disturbance persists but expected to resolve post-treatment.  Stye of lower lid - Apply warm compresses to the stye.   All questions were answered. The patient knows to call the clinic with any problems, questions or concerns. We can certainly see the patient much sooner if necessary.  Total encounter time: 30 minutes*in face-to-face visit time, chart review, lab review, care coordination, order entry, and documentation of the encounter time.    *Total Encounter Time as defined by the Centers for Medicare and Medicaid Services includes, in addition to the face-to-face time of a patient visit (documented in the note above) non-face-to-face time: obtaining and reviewing outside history, ordering and reviewing medications, tests or procedures, care coordination (communications with other health care professionals or caregivers) and documentation in the medical record.

## 2024-01-28 NOTE — Patient Instructions (Signed)
 [facility] - A DEPT OF [facility]  Discharge Instructions: Thank you for choosing [facility] to provide your oncology and hematology care.   If you have a lab appointment with the Cancer Center, please go directly to the Cancer Center and check in at the registration area.   Wear comfortable clothing and clothing appropriate for easy access to any Portacath or PICC line.   We strive to give you quality time with your provider. You may need to reschedule your appointment if you arrive late (15 or more minutes).  Arriving late affects you and other patients whose appointments are after yours.  Also, if you miss three or more appointments without notifying the office, you may be dismissed from the clinic at the provider's discretion.      For prescription refill requests, have your pharmacy contact our office and allow 72 hours for refills to be completed.    Today you received the following chemotherapy and/or immunotherapy agents paclitaxel, cytoxan       To help prevent nausea and vomiting after your treatment, we encourage you to take your nausea medication as directed.  BELOW ARE SYMPTOMS THAT SHOULD BE REPORTED IMMEDIATELY: *FEVER GREATER THAN 100.4 F (38 C) OR HIGHER *CHILLS OR SWEATING *NAUSEA AND VOMITING THAT IS NOT CONTROLLED WITH YOUR NAUSEA MEDICATION *UNUSUAL SHORTNESS OF BREATH *UNUSUAL BRUISING OR BLEEDING *URINARY PROBLEMS (pain or burning when urinating, or frequent urination) *BOWEL PROBLEMS (unusual diarrhea, constipation, pain near the anus) TENDERNESS IN MOUTH AND THROAT WITH OR WITHOUT PRESENCE OF ULCERS (sore throat, sores in mouth, or a toothache) UNUSUAL RASH, SWELLING OR PAIN  UNUSUAL VAGINAL DISCHARGE OR ITCHING   Items with * indicate a potential emergency and should be followed up as soon as possible or go to the Emergency Department if any problems should occur.  Please show the CHEMOTHERAPY ALERT CARD or  IMMUNOTHERAPY ALERT CARD at check-in to the Emergency Department and triage nurse.  Should you have questions after your visit or need to cancel or reschedule your appointment, please contact [facility] - A DEPT [facility]  Dept: [phone]  and follow the prompts.  Office hours are 8:00 a.m. to 4:30 p.m. Monday - Friday. Please note that voicemails left after 4:00 p.m. may not be returned until the following business day.  We are closed weekends and major holidays. You have access to a nurse at all times for urgent questions. Please call the main number to the clinic Dept: [phone] and follow the prompts.   For any non-urgent questions, you may also contact your provider using MyChart. We now offer e-Visits for anyone 65 and older to request care online for non-urgent symptoms. For details visit [email or web address].   Also download the MyChart app! Go to the app store, search MyChart, open the app, select [place], and log in with your MyChart username and password.

## 2024-01-28 NOTE — Telephone Encounter (Signed)
 Referrel order placed for Rad Onc at [facility] placed in EPIC - called to Demaris at [facility] - obtained VM- detailed message left regarding placed referral.

## 2024-01-29 ENCOUNTER — Other Ambulatory Visit: Payer: Self-pay | Admitting: General Surgery

## 2024-01-30 ENCOUNTER — Inpatient Hospital Stay

## 2024-01-30 VITALS — BP 124/57 | HR 72 | Temp 99.5°F | Resp 19

## 2024-01-30 DIAGNOSIS — Z171 Estrogen receptor negative status [ER-]: Secondary | ICD-10-CM

## 2024-01-30 DIAGNOSIS — C50411 Malignant neoplasm of upper-outer quadrant of right female breast: Secondary | ICD-10-CM

## 2024-01-30 DIAGNOSIS — Z5111 Encounter for antineoplastic chemotherapy: Secondary | ICD-10-CM | POA: Diagnosis not present

## 2024-01-30 MED ORDER — PEGFILGRASTIM-JMDB 6 MG/0.6ML ~~LOC~~ SOSY
6.0000 mg | PREFILLED_SYRINGE | Freq: Once | SUBCUTANEOUS | Status: AC
  Administered 2024-01-30 (×2): 6 mg via SUBCUTANEOUS
  Filled 2024-01-30: qty 0.6

## 2024-02-04 ENCOUNTER — Encounter: Payer: Self-pay | Admitting: Radiation Oncology

## 2024-02-04 ENCOUNTER — Ambulatory Visit
Admission: RE | Admit: 2024-02-04 | Discharge: 2024-02-04 | Disposition: A | Discharge status: Home / Self Care | Source: Ambulatory Visit | Attending: Radiation Oncology | Admitting: Radiation Oncology

## 2024-02-04 VITALS — BP 128/52 | HR 91 | Temp 98.1°F | Resp 16 | Ht 62.0 in | Wt 117.9 lb

## 2024-02-04 DIAGNOSIS — Z808 Family history of malignant neoplasm of other organs or systems: Secondary | ICD-10-CM | POA: Diagnosis not present

## 2024-02-04 DIAGNOSIS — Z9221 Personal history of antineoplastic chemotherapy: Secondary | ICD-10-CM | POA: Insufficient documentation

## 2024-02-04 DIAGNOSIS — C50411 Malignant neoplasm of upper-outer quadrant of right female breast: Secondary | ICD-10-CM | POA: Diagnosis present

## 2024-02-04 DIAGNOSIS — R5383 Other fatigue: Secondary | ICD-10-CM | POA: Insufficient documentation

## 2024-02-04 DIAGNOSIS — Z171 Estrogen receptor negative status [ER-]: Secondary | ICD-10-CM | POA: Insufficient documentation

## 2024-02-04 DIAGNOSIS — Z1721 Progesterone receptor positive status: Secondary | ICD-10-CM | POA: Insufficient documentation

## 2024-02-04 DIAGNOSIS — Z79899 Other long term (current) drug therapy: Secondary | ICD-10-CM | POA: Insufficient documentation

## 2024-02-04 DIAGNOSIS — Z801 Family history of malignant neoplasm of trachea, bronchus and lung: Secondary | ICD-10-CM | POA: Insufficient documentation

## 2024-02-04 DIAGNOSIS — Z1732 Human epidermal growth factor receptor 2 negative status: Secondary | ICD-10-CM | POA: Insufficient documentation

## 2024-02-04 DIAGNOSIS — Z803 Family history of malignant neoplasm of breast: Secondary | ICD-10-CM | POA: Diagnosis not present

## 2024-02-04 DIAGNOSIS — Z800 Family history of malignant neoplasm of digestive organs: Secondary | ICD-10-CM | POA: Diagnosis not present

## 2024-02-04 NOTE — Progress Notes (Signed)
 Radiation Oncology         [phone] ________________________________  Name: Heather Lewis        MRN: [id number]  Date of Service: 02/04/2024 DOB: 07-05-1945  RR:Hmpddn, Cathlyn LABOR., MD  Ebbie Cough, MD     REFERRING PHYSICIAN: Loretha Ash, MD  DIAGNOSIS: Invasive ductal carcinoma of the right breast, pT1cpN0   HISTORY OF PRESENT ILLNESS: Heather Lewis is a 78 y.o. female seen at the request of Dr. Loretha.  She was originally diagnosed with breast cancer earlier this year, after noticing a palpable mass in her right breast.  Mammography ultimately revealed a suspicious 1.8 cm mass in the right breast 10 o'clock position, with a suspicious 9 mm mass located adjacently subsequent biopsy revealed invasive poorly differentiated ductal carcinoma, grade 3.  She was seen in consultation by Dr. Ebbie, and underwent right breast lumpectomy and sentinel lymph node sampling.  Pathology revealed invasive ductal carcinoma, measuring approximately 1.8 cm, grade 3.  Surgical margins were ultimately clear.  3 lymph nodes were sampled; no evidence of carcinoma involvement was seen.  Her carcinoma was ER/PR negative, and HER2 negative.  She was seen in consultation by Dr. Loretha, and recently completed adjuvant chemotherapy with Taxotere /Cytoxan .  Consultation is requested regarding the potential role of radiation in her care.    PREVIOUS RADIATION THERAPY: No   PAST MEDICAL HISTORY:  Past Medical History:  Diagnosis Date   Age-related macular degeneration    Bunion of right foot 08/2017   Cancer (HCC) 08/2023   right breast ICD with DCIS   Complication of anesthesia    hard to wake up from colonoscopy when Propofol  not used   Dental crowns present    Metatarsalgia of right foot 08/2017   2nd toe   Migraines    with change of weather   TMJ syndrome        PAST SURGICAL HISTORY: Past Surgical History:  Procedure Laterality Date   APPENDECTOMY  1960   AXILLARY SENTINEL NODE  BIOPSY Right 09/13/2023   Procedure: BIOPSY, LYMPH NODE, SENTINEL, AXILLARY;  Surgeon: Ebbie Cough, MD;  Location: [facility];  Service: General;  Laterality: Right;  right   BREAST BIOPSY Right 08/23/2023   US  RT BREAST BX W LOC DEV 1ST LESION IMG BX SPEC US  GUIDE 08/23/2023 [facility]   BREAST BIOPSY Right 08/23/2023   US  RT BREAST BX W LOC DEV EA ADD LESION IMG BX SPEC US  GUIDE 08/23/2023 [facility]   BREAST BIOPSY Right 08/23/2023   MM RT BREAST BX W LOC DEV 1ST LESION IMAGE BX SPEC STEREO GUIDE 08/23/2023 [facility]   BREAST BIOPSY  09/11/2023   MM RT RADIOACTIVE SEED EA ADD LESION LOC MAMMO GUIDE 09/11/2023 [facility]   BREAST BIOPSY  09/11/2023   MM RT RADIOACTIVE SEED EA ADD LESION LOC MAMMO GUIDE 09/11/2023 [facility]   BREAST BIOPSY  09/11/2023   MM RT RADIOACTIVE SEED LOC MAMMO GUIDE 09/11/2023 [facility]   BREAST LUMPECTOMY Right 10/08/2023   Procedure: BREAST LUMPECTOMY;  Surgeon: Ebbie Cough, MD;  Location: [facility];  Service: General;  Laterality: Right;  RIGHT BREAST MARGIN RE-EXCISION   BREAST LUMPECTOMY Right 11/06/2023   Procedure: RIGHT BREAST RE-EXCISION LUMPECTOMY;  Surgeon: Ebbie Cough, MD;  Location: [facility];  Service: General;  Laterality: Right;   BREAST LUMPECTOMY WITH RADIOACTIVE SEED LOCALIZATION Right 09/13/2023   Procedure: BREAST LUMPECTOMY WITH RADIOACTIVE SEED LOCALIZATION;  Surgeon: Ebbie Cough, MD;  Location: [facility];  Service: General;  Laterality: Right;  Right breast radioactive seed bracketed lumpectomy   BUNIONECTOMY WITH WEIL OSTEOTOMY Right 09/06/2017   Procedure: Right Lapidus, Modified Woodard Edwards;  Surgeon: Kit Rush, MD;  Location: [facility];  Service: Orthopedics;  Laterality: Right;   COLONOSCOPY WITH PROPOFOL   08/08/2016   HAMMERTOE RECONSTRUCTION WITH WEIL OSTEOTOMY  06/27/2018   Procedure: LEFT  SECOND HAMMERTOE RECONSTRUCTION WITH WEIL OSTEOTOMY;  Surgeon: Kit Rush, MD;  Location: [facility];  Service: Orthopedics;;   PORTACATH PLACEMENT Right 09/13/2023   Procedure: INSERTION, TUNNELED CENTRAL VENOUS DEVICE, WITH PORT;  Surgeon: Ebbie Cough, MD;  Location: [facility];  Service: General;  Laterality: Right;  GEN w/PEC BLOCK   TARSAL METATARSAL ARTHRODESIS Left 06/27/2018   Procedure: Left modified McBride and lapidus 1st tarsometatarsal arthrodesis;  Surgeon: Kit Rush, MD;  Location: [facility];  Service: Orthopedics;  Laterality: Left;    TONSILLECTOMY AND ADENOIDECTOMY  1954     FAMILY HISTORY:  Family History  Problem Relation Age of Onset   Colon polyps Mother    Lung cancer Mother        d. 60   Colon polyps Father    Melanoma Father    Colon cancer Brother        and polyps; dx 52s   Lung cancer Maternal Aunt    Lung cancer Maternal Grandfather    Breast cancer Niece        dx 55s     SOCIAL HISTORY:  reports that she has never smoked. She has never used smokeless tobacco. She reports that she does not currently use alcohol . She reports that she does not use drugs.   ALLERGIES: Nitrates, organic and Oxycodone    MEDICATIONS:  Current Outpatient Medications  Medication Sig Dispense Refill   cholecalciferol (VITAMIN D) 1000 units tablet Take 1,000 Units by mouth daily.     docusate sodium  (COLACE) 100 MG capsule Take 1 capsule (100 mg total) by mouth 2 (two) times daily. While taking narcotic pain medicine. 30 capsule 0   ibuprofen (ADVIL,MOTRIN) 800 MG tablet Take 800 mg by mouth every 8 (eight) hours as needed. migraines     Iron, Ferrous Sulfate, 325 (65 Fe) MG TABS      lidocaine -prilocaine  (EMLA ) cream Apply to affected area once 30 g 3   Multiple Vitamins-Minerals (EYE VITAMINS PO) Take by mouth.     ondansetron  (ZOFRAN ) 8 MG tablet Take 1 tablet (8 mg total) by mouth every 8 (eight) hours as  needed for nausea or vomiting. Start on the third day after chemotherapy. 30 tablet 1   prochlorperazine  (COMPAZINE ) 10 MG tablet Take 1 tablet (10 mg total) by mouth every 6 (six) hours as needed for nausea or vomiting. 30 tablet 1   senna (SENOKOT) 8.6 MG TABS tablet Take 2 tablets (17.2 mg total) by mouth 2 (two) times daily. 30 each 0   No current facility-administered medications for this encounter.     REVIEW OF SYSTEMS: On review of systems, the patient reports that she is doing well overall.  She is somewhat fatigued.  She denies any chest pain, shortness of breath, cough, fevers, chills, night sweats, unintended weight changes.  She denies any bowel or bladder disturbances, and denies abdominal pain, nausea or vomiting.  She denies any new musculoskeletal or joint aches or pains. A complete review of systems is obtained and is otherwise negative.  PHYSICAL EXAM:  Wt Readings from Last 3 Encounters:  02/04/24 117 lb 14.4 oz (53.5 kg)  01/28/24 120 lb 9.6 oz (54.7 kg)  01/07/24 120 lb 1.6 oz (54.5 kg)   Temp Readings from Last 3 Encounters:  02/04/24 98.1 F (36.7 C) (Oral)  01/30/24 99.5 F (37.5 C) (Oral)  01/28/24 98.4 F (36.9 C) (Temporal)   BP Readings from Last 3 Encounters:  02/04/24 (!) 128/52  01/30/24 (!) 124/57  01/28/24 (!) 120/52   Pulse Readings from Last 3 Encounters:  02/04/24 91  01/30/24 72  01/28/24 86   Pain Assessment Pain Score: 0-No pain/10  In general this is a well appearing female in no acute distress.  She's alert and oriented x4 and appropriate throughout the examination. Cardiopulmonary assessment is negative for acute distress and she exhibits normal effort.  No cervical, supraclavicular, or axillary lymphadenopathy is appreciated.  The left breast is without masses or edema.  Examination of the right breast reveals her well-healed lumpectomy incision, with minimal surrounding tenderness to palpation.    ECOG = 1  0 - Asymptomatic  (Fully active, able to carry on all predisease activities without restriction)  1 - Symptomatic but completely ambulatory (Restricted in physically strenuous activity but ambulatory and able to carry out work of a light or sedentary nature. For example, light housework, office work)  2 - Symptomatic, <50% in bed during the day (Ambulatory and capable of all self care but unable to carry out any work activities. Up and about more than 50% of waking hours)  3 - Symptomatic, >50% in bed, but not bedbound (Capable of only limited self-care, confined to bed or chair 50% or more of waking hours)  4 - Bedbound (Completely disabled. Cannot carry on any self-care. Totally confined to bed or chair)  5 - Death   Raylene MM, Creech RH, Tormey DC, et al. [phone]). Toxicity and response criteria of the [facility]. Am. DOROTHA Bridges. Oncol. 5 (6): 649-55    LABORATORY DATA:  Lab Results  Component Value Date   WBC 9.2 01/28/2024   HGB 9.4 (L) 01/28/2024   HCT 28.5 (L) 01/28/2024   MCV 88.8 01/28/2024   PLT 356 01/28/2024   Lab Results  Component Value Date   NA 140 01/28/2024   K 3.8 01/28/2024   CL 109 01/28/2024   CO2 22 01/28/2024   Lab Results  Component Value Date   ALT 17 01/28/2024   AST 14 (L) 01/28/2024   ALKPHOS 75 01/28/2024   BILITOT 0.3 01/28/2024       IMPRESSION/PLAN: 1.  The patient is a 78 year old female status post right breast lumpectomy and sentinel lymph node sampling, followed by adjuvant chemotherapy, for an invasive ductal carcinoma of the right breast, pT1cpN0, triple negative.  She reports having tolerated chemotherapy reasonably well overall.  I spoke at length with the patient and her family members regarding the role of adjuvant breast radiation in her care.  I reviewed the logistics of external beam radiation, as well as its potential side effects.  I explained that these may include, but are not limited to, skin irritation/breakdown, fatigue,  and risk of chronic injury to a small amount of right lung.  She expressed understanding, and is agreeable to proceed.  Arrangements will be made for her to return to our department for simulation and treatment planning.  In a visit lasting 60 minutes, greater than 50% of the time was spent face to face discussing the patient's  condition, in preparation for the discussion, and coordinating the patient's care.    Marquavis Hannen A. Jomarie, MD   **Disclaimer: This note was dictated with voice recognition software. Similar sounding words can inadvertently be transcribed and this note may contain transcription errors which may not have been corrected upon publication of note.**

## 2024-02-06 ENCOUNTER — Telehealth: Payer: Self-pay

## 2024-02-06 ENCOUNTER — Encounter (HOSPITAL_BASED_OUTPATIENT_CLINIC_OR_DEPARTMENT_OTHER): Payer: Self-pay | Admitting: General Surgery

## 2024-02-06 MED ORDER — CIPROFLOXACIN HCL 500 MG PO TABS: 500.0000 mg | ORAL_TABLET | Freq: Two times a day (BID) | ORAL | 0 refills | Status: AC

## 2024-02-06 NOTE — Telephone Encounter (Signed)
 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 She states she feels dysuria at the end of her stream. Since pt has hx of recurrent UTIs MD gave orders for cipro  500 mg 1 tab PO BID x 7 days no refills.  Pt was educated on medication dosing and encouraged to drink more water. Also advised pt to call us  in a week if she is not feeling sx relief from Cipro  and we will bring her in for UA/UC. She is agreeable to this plan and expressed gratitude for help.

## 2024-02-11 ENCOUNTER — Encounter: Payer: Self-pay | Admitting: *Deleted

## 2024-02-11 NOTE — Progress Notes (Signed)
 Verified local vs mac with Dr. Ebbie. Dr.Wakefield confirmed the case is a local w/MAC. This RN left a message with the patient and updated preop instructions.

## 2024-02-12 ENCOUNTER — Encounter (HOSPITAL_BASED_OUTPATIENT_CLINIC_OR_DEPARTMENT_OTHER): Payer: Self-pay | Admitting: General Surgery

## 2024-02-12 ENCOUNTER — Ambulatory Visit (HOSPITAL_BASED_OUTPATIENT_CLINIC_OR_DEPARTMENT_OTHER)
Admission: RE | Admit: 2024-02-12 | Discharge: 2024-02-12 | Disposition: A | Discharge status: Home / Self Care | Attending: General Surgery | Admitting: General Surgery

## 2024-02-12 ENCOUNTER — Other Ambulatory Visit: Payer: Self-pay

## 2024-02-12 ENCOUNTER — Encounter (HOSPITAL_BASED_OUTPATIENT_CLINIC_OR_DEPARTMENT_OTHER)
Admission: RE | Disposition: A | Discharge status: Home / Self Care | Payer: Self-pay | Source: Home / Self Care | Attending: General Surgery

## 2024-02-12 ENCOUNTER — Ambulatory Visit (HOSPITAL_BASED_OUTPATIENT_CLINIC_OR_DEPARTMENT_OTHER): Admitting: Certified Registered"

## 2024-02-12 DIAGNOSIS — Z853 Personal history of malignant neoplasm of breast: Secondary | ICD-10-CM | POA: Diagnosis not present

## 2024-02-12 DIAGNOSIS — Z171 Estrogen receptor negative status [ER-]: Secondary | ICD-10-CM

## 2024-02-12 DIAGNOSIS — R519 Headache, unspecified: Secondary | ICD-10-CM | POA: Diagnosis not present

## 2024-02-12 DIAGNOSIS — Z9221 Personal history of antineoplastic chemotherapy: Secondary | ICD-10-CM | POA: Insufficient documentation

## 2024-02-12 DIAGNOSIS — C50411 Malignant neoplasm of upper-outer quadrant of right female breast: Secondary | ICD-10-CM

## 2024-02-12 DIAGNOSIS — Z800 Family history of malignant neoplasm of digestive organs: Secondary | ICD-10-CM | POA: Diagnosis not present

## 2024-02-12 DIAGNOSIS — Z803 Family history of malignant neoplasm of breast: Secondary | ICD-10-CM | POA: Insufficient documentation

## 2024-02-12 DIAGNOSIS — Z83719 Family history of colon polyps, unspecified: Secondary | ICD-10-CM | POA: Insufficient documentation

## 2024-02-12 DIAGNOSIS — Z801 Family history of malignant neoplasm of trachea, bronchus and lung: Secondary | ICD-10-CM | POA: Insufficient documentation

## 2024-02-12 DIAGNOSIS — Z452 Encounter for adjustment and management of vascular access device: Secondary | ICD-10-CM | POA: Diagnosis present

## 2024-02-12 HISTORY — PX: PORT-A-CATH REMOVAL: SHX5289

## 2024-02-12 SURGERY — REMOVAL PORT-A-CATH
Anesthesia: Monitor Anesthesia Care | Site: Chest | Laterality: Right

## 2024-02-12 MED ORDER — CHLORHEXIDINE GLUCONATE CLOTH 2 % EX PADS: 6.0000 | MEDICATED_PAD | Freq: Once | CUTANEOUS | Status: DC

## 2024-02-12 MED ORDER — LACTATED RINGERS IV SOLN: INTRAVENOUS | Status: DC

## 2024-02-12 MED ORDER — LIDOCAINE 2% (20 MG/ML) 5 ML SYRINGE
INTRAMUSCULAR | Status: AC
  Filled 2024-02-12: qty 5

## 2024-02-12 MED ORDER — PHENYLEPHRINE HCL (PRESSORS) 10 MG/ML IV SOLN
INTRAVENOUS | Status: DC | PRN
  Administered 2024-02-12 (×2): 40 ug via INTRAVENOUS
  Administered 2024-02-12: 80 ug via INTRAVENOUS

## 2024-02-12 MED ORDER — PROPOFOL 10 MG/ML IV BOLUS
INTRAVENOUS | Status: DC | PRN
  Administered 2024-02-12: 20 mg via INTRAVENOUS

## 2024-02-12 MED ORDER — PROPOFOL 500 MG/50ML IV EMUL
INTRAVENOUS | Status: AC
  Filled 2024-02-12: qty 50

## 2024-02-12 MED ORDER — ACETAMINOPHEN 500 MG PO TABS
ORAL_TABLET | ORAL | Status: AC
  Filled 2024-02-12: qty 2

## 2024-02-12 MED ORDER — CEFAZOLIN SODIUM-DEXTROSE 2-4 GM/100ML-% IV SOLN
2.0000 g | INTRAVENOUS | Status: AC
  Administered 2024-02-12: 2 g via INTRAVENOUS

## 2024-02-12 MED ORDER — FENTANYL CITRATE (PF) 100 MCG/2ML IJ SOLN
INTRAMUSCULAR | Status: AC
  Filled 2024-02-12: qty 2

## 2024-02-12 MED ORDER — ONDANSETRON HCL 4 MG/2ML IJ SOLN
INTRAMUSCULAR | Status: DC | PRN
  Administered 2024-02-12: 4 mg via INTRAVENOUS

## 2024-02-12 MED ORDER — FENTANYL CITRATE (PF) 100 MCG/2ML IJ SOLN
INTRAMUSCULAR | Status: DC | PRN
  Administered 2024-02-12: 50 ug via INTRAVENOUS

## 2024-02-12 MED ORDER — FENTANYL CITRATE (PF) 100 MCG/2ML IJ SOLN: 25.0000 ug | INTRAMUSCULAR | Status: DC | PRN

## 2024-02-12 MED ORDER — PROPOFOL 500 MG/50ML IV EMUL
INTRAVENOUS | Status: DC | PRN
  Administered 2024-02-12: 100 ug/kg/min via INTRAVENOUS

## 2024-02-12 MED ORDER — ACETAMINOPHEN 500 MG PO TABS
1000.0000 mg | ORAL_TABLET | ORAL | Status: AC
  Administered 2024-02-12: 1000 mg via ORAL

## 2024-02-12 MED ORDER — CEFAZOLIN SODIUM-DEXTROSE 2-4 GM/100ML-% IV SOLN
INTRAVENOUS | Status: AC
  Filled 2024-02-12: qty 100

## 2024-02-12 MED ORDER — ONDANSETRON HCL 4 MG/2ML IJ SOLN: 4.0000 mg | Freq: Once | INTRAMUSCULAR | Status: DC | PRN

## 2024-02-12 MED ORDER — CHLORHEXIDINE GLUCONATE CLOTH 2 % EX PADS
6.0000 | MEDICATED_PAD | Freq: Once | CUTANEOUS | Status: AC
  Administered 2024-02-12: 6 via TOPICAL

## 2024-02-12 MED ORDER — ONDANSETRON HCL 4 MG/2ML IJ SOLN
INTRAMUSCULAR | Status: AC
  Filled 2024-02-12: qty 2

## 2024-02-12 MED ORDER — BUPIVACAINE-EPINEPHRINE 0.25% -1:200000 IJ SOLN
INTRAMUSCULAR | Status: DC | PRN
  Administered 2024-02-12: 10 mL

## 2024-02-12 SURGICAL SUPPLY — 24 items
BLADE SURG 15 STRL LF DISP TIS (BLADE) ×1 IMPLANT
CHLORAPREP W/TINT 26 (MISCELLANEOUS) ×1 IMPLANT
COVER BACK TABLE 60X90IN (DRAPES) ×1 IMPLANT
COVER MAYO STAND STRL (DRAPES) ×1 IMPLANT
DERMABOND ADVANCED .7 DNX12 (GAUZE/BANDAGES/DRESSINGS) ×1 IMPLANT
DRAPE LAPAROTOMY 100X72 PEDS (DRAPES) ×1 IMPLANT
DRAPE UTILITY XL STRL (DRAPES) ×1 IMPLANT
ELECT COATED BLADE 2.86 ST (ELECTRODE) ×1 IMPLANT
ELECTRODE REM PT RTRN 9FT ADLT (ELECTROSURGICAL) ×1 IMPLANT
GLOVE BIO SURGEON STRL SZ7 (GLOVE) ×1 IMPLANT
GLOVE BIOGEL PI IND STRL 7.5 (GLOVE) ×1 IMPLANT
GOWN STRL REUS W/ TWL LRG LVL3 (GOWN DISPOSABLE) ×2 IMPLANT
NDL HYPO 25X1 1.5 SAFETY (NEEDLE) ×1 IMPLANT
NEEDLE HYPO 25X1 1.5 SAFETY (NEEDLE) ×1 IMPLANT
PACK BASIN DAY SURGERY FS (CUSTOM PROCEDURE TRAY) ×1 IMPLANT
PENCIL SMOKE EVACUATOR (MISCELLANEOUS) ×1 IMPLANT
SLEEVE SCD COMPRESS KNEE MED (STOCKING) IMPLANT
SPIKE FLUID TRANSFER (MISCELLANEOUS) ×1 IMPLANT
SPONGE T-LAP 4X18 ~~LOC~~+RFID (SPONGE) ×1 IMPLANT
STRIP CLOSURE SKIN 1/2X4 (GAUZE/BANDAGES/DRESSINGS) ×1 IMPLANT
SUT MNCRL AB 4-0 PS2 18 (SUTURE) ×1 IMPLANT
SUT VIC AB 3-0 SH 27X BRD (SUTURE) ×1 IMPLANT
SYR CONTROL 10ML LL (SYRINGE) ×1 IMPLANT
TOWEL GREEN STERILE FF (TOWEL DISPOSABLE) ×1 IMPLANT

## 2024-02-12 NOTE — Discharge Instructions (Addendum)
 [facility] Office Phone Number [phone]  POST OP INSTRUCTIONS Take 400 mg of ibuprofen every 8 hours or 650 mg tylenol  every 6 hours for next 72 hours then as needed. Use ice several times daily also. No Tylenol  before 5:45pm today, if needed.   Take your usually prescribed medications unless otherwise directed   Resume your normal diet after surgery. Most patients will experience some swelling and bruising.   Ice packs  will help. Swelling and bruising can take several days to resolve.  It is common to experience some constipation if taking pain medication after surgery.  Increasing fluid intake and taking a stool softener will usually help or prevent this problem from occurring.  A mild laxative (Milk of Magnesia or Miralax) should be taken according to package directions if there are no bowel movements after 48 hours. I used skin glue on the incision, you may shower in 24 hours.  The glue will flake off over the next 2-3 weeks as will the steristrips.   Any sutures or staples will be removed at the office during your follow-up visit. ACTIVITIES:  You may resume regular daily activities (gradually increasing) beginning the next day. You may have sexual intercourse when it is comfortable. You may drive when you no longer are taking prescription pain medication, you can comfortably wear a seatbelt, and you can safely maneuver your car and apply brakes. RETURN TO WORK:  ______________________________________________________________________________________  WHEN TO CALL DR WAKEFIELD: Fever over 101.0 Nausea and/or vomiting. Extreme swelling or bruising. Continued bleeding from incision. Increased pain, redness, or drainage from the incision.  The clinic staff is available to answer your questions during regular business hours.  Please don't hesitate to call and ask to speak to one of the nurses for clinical concerns.  If you have a medical emergency, go to the nearest  emergency room or call 911.  A surgeon from [facility] is always on call at the hospital.  For further questions, please visit [email or web address]  Post Anesthesia Home Care Instructions  Activity: Get plenty of rest for the remainder of the day. A responsible individual must stay with you for 24 hours following the procedure.  For the next 24 hours, DO NOT: -Drive a car -Advertising copywriter -Drink alcoholic beverages -Take any medication unless instructed by your physician -Make any legal decisions or sign important papers.  Meals: Start with liquid foods such as gelatin or soup. Progress to regular foods as tolerated. Avoid greasy, spicy, heavy foods. If nausea and/or vomiting occur, drink only clear liquids until the nausea and/or vomiting subsides. Call your physician if vomiting continues.  Special Instructions/Symptoms: Your throat may feel dry or sore from the anesthesia or the breathing tube placed in your throat during surgery. If this causes discomfort, gargle with warm salt water. The discomfort should disappear within 24 hours.  If you had a scopolamine  patch placed behind your ear for the management of post- operative nausea and/or vomiting:  1. The medication in the patch is effective for 72 hours, after which it should be removed.  Wrap patch in a tissue and discard in the trash. Wash hands thoroughly with soap and water. 2. You may remove the patch earlier than 72 hours if you experience unpleasant side effects which may include dry mouth, dizziness or visual disturbances. 3. Avoid touching the patch. Wash your hands with soap and water after contact with the patch.

## 2024-02-12 NOTE — Anesthesia Postprocedure Evaluation (Signed)
 Anesthesia Post Note  Patient: Heather Lewis  Procedure(s) Performed: REMOVAL PORT-A-CATH (Right: Chest)     Patient location during evaluation: PACU Anesthesia Type: MAC Level of consciousness: awake and alert Pain management: pain level controlled Vital Signs Assessment: post-procedure vital signs reviewed and stable Respiratory status: spontaneous breathing, nonlabored ventilation, respiratory function stable and patient connected to nasal cannula oxygen Cardiovascular status: stable and blood pressure returned to baseline Postop Assessment: no apparent nausea or vomiting Anesthetic complications: no   No notable events documented.  Last Vitals:  Vitals:   02/12/24 1418 02/12/24 1430  BP: (!) 102/50 109/61  Pulse: 86 84  Resp: 17 16  Temp: 36.9 C   SpO2: 96% 95%    Last Pain:  Vitals:   02/12/24 1418  TempSrc:   PainSc: 0-No pain                 Rome Ade

## 2024-02-12 NOTE — Transfer of Care (Signed)
 Immediate Anesthesia Transfer of Care Note  Patient: Heather Lewis  Procedure(s) Performed: REMOVAL PORT-A-CATH (Right: Chest)  Patient Location: PACU  Anesthesia Type:MAC  Level of Consciousness: awake, alert , and oriented  Airway & Oxygen Therapy: Patient Spontanous Breathing and Patient connected to face mask oxygen  Post-op Assessment: Report given to RN and Post -op Vital signs reviewed and stable  Post vital signs: Reviewed and stable  Last Vitals:  Vitals Value Taken Time  BP 102/50 (65)   Temp    Pulse 86   Resp 18   SpO2 96     Last Pain:  Vitals:   02/12/24 1141  TempSrc:   PainSc: 0-No pain         Complications: No notable events documented.

## 2024-02-12 NOTE — Anesthesia Preprocedure Evaluation (Signed)
 Anesthesia Evaluation  Patient identified by MRN, date of birth, ID band Patient awake    Reviewed: Allergy & Precautions, NPO status , Patient's Chart, lab work & pertinent test results  History of Anesthesia Complications (+) PONV and history of anesthetic complications  Airway Mallampati: II  TM Distance: >3 FB Neck ROM: Full    Dental no notable dental hx. (+) Teeth Intact   Pulmonary neg pulmonary ROS, neg sleep apnea, neg COPD, Patient abstained from smoking.Not current smoker   Pulmonary exam normal breath sounds clear to auscultation       Cardiovascular Exercise Tolerance: Good METS(-) hypertension(-) CAD and (-) Past MI negative cardio ROS (-) dysrhythmias  Rhythm:Regular Rate:Normal - Systolic murmurs    Neuro/Psych  Headaches  negative psych ROS   GI/Hepatic ,neg GERD  ,,(+)     (-) substance abuse    Endo/Other  neg diabetes    Renal/GU negative Renal ROS     Musculoskeletal   Abdominal   Peds  Hematology   Anesthesia Other Findings Past Medical History: No date: Age-related macular degeneration 08/2017: Bunion of right foot 08/2023: Cancer (HCC)     Comment:  right breast ICD with DCIS No date: Complication of anesthesia     Comment:  hard to wake up from colonoscopy when Propofol  not used No date: Dental crowns present 08/2017: Metatarsalgia of right foot     Comment:  2nd toe No date: Migraines     Comment:  with change of weather No date: TMJ syndrome  Reproductive/Obstetrics                              Anesthesia Physical Anesthesia Plan  ASA: 2  Anesthesia Plan: MAC   Post-op Pain Management: Minimal or no pain anticipated   Induction: Intravenous  PONV Risk Score and Plan: 3 and Propofol  infusion, TIVA, Ondansetron  and Treatment may vary due to age or medical condition  Airway Management Planned: Nasal Cannula and Natural Airway  Additional  Equipment: None  Intra-op Plan:   Post-operative Plan:   Informed Consent: I have reviewed the patients History and Physical, chart, labs and discussed the procedure including the risks, benefits and alternatives for the proposed anesthesia with the patient or authorized representative who has indicated his/her understanding and acceptance.     Dental advisory given  Plan Discussed with: CRNA and Surgeon  Anesthesia Plan Comments: (Patient ate dry toast and a piece of pineapple at 0730. Will delay case until at least 1330 for NPO time.  Discussed risks of anesthesia with patient, including possibility of difficulty with spontaneous ventilation under anesthesia necessitating airway intervention, PONV, and rare risks such as cardiac or respiratory or neurological events, and allergic reactions. Discussed the role of CRNA in patient's perioperative care. Patient understands.)        Anesthesia Quick Evaluation

## 2024-02-12 NOTE — Interval H&P Note (Signed)
 History and Physical Interval Note:  02/12/2024 1:44 PM  Heather Lewis  has presented today for surgery, with the diagnosis of BREAST CANCER.  The various methods of treatment have been discussed with the patient and family. After consideration of risks, benefits and other options for treatment, the patient has consented to  Procedure(s) with comments: REMOVAL PORT-A-CATH (N/A) - LOCAL MAC as a surgical intervention.  The patient's history has been reviewed, patient examined, no change in status, stable for surgery.  I have reviewed the patient's chart and labs.  Questions were answered to the patient's satisfaction.     [name]

## 2024-02-12 NOTE — Op Note (Signed)
 Preoperative diagnosis: Breast cancer no longer needs venous access Postoperative diagnosis: Same as above Procedure: Right internal jugular port removal Surgeon: Dr. Adina Bury Anesthesia: Local with sedation Estimated blood loss: Minimal Complications: None Drains: None Specimens: None Disposition recovery stable addition  Indications: This is a 78 year old female who presents for port removal after she has completed chemotherapy for breast cancer.  Procedure: After informed consent was obtained she was taken to the operating room.  She was placed under monitored anesthesia care.  She was then prepped and draped in standard sterile surgical fashion.  Surgical timeout was then performed.  I then infiltrated mixture of 1% lidocaine  and point percent Marcaine  with epinephrine  under her old chest incision.  I then reentered this.  I then was able to remove the line, port, and suture material in their entirety.  Hemostasis was then obtained.  I then closed this with 3-0 Vicryl and 4 Monocryl.  Glue and Steri-Strips were applied.  She tolerated this well and was transferred recovery stable.

## 2024-02-12 NOTE — H&P (Signed)
 Heather Lewis is an 78 y.o. female.   Chief Complaint: breast cancer HPI: 85 yof with breast cancer who has completed chemotherapy and desires port removal.    Past Medical History:  Diagnosis Date   Age-related macular degeneration    Bunion of right foot 08/2017   Cancer (HCC) 08/2023   right breast ICD with DCIS   Complication of anesthesia    hard to wake up from colonoscopy when Propofol  not used   Dental crowns present    Metatarsalgia of right foot 08/2017   2nd toe   Migraines    with change of weather   TMJ syndrome     Past Surgical History:  Procedure Laterality Date   APPENDECTOMY  1960   AXILLARY SENTINEL NODE BIOPSY Right 09/13/2023   Procedure: BIOPSY, LYMPH NODE, SENTINEL, AXILLARY;  Surgeon: Ebbie Cough, MD;  Location: [facility];  Service: General;  Laterality: Right;  right   BREAST BIOPSY Right 08/23/2023   US  RT BREAST BX W LOC DEV 1ST LESION IMG BX SPEC US  GUIDE 08/23/2023 [facility]   BREAST BIOPSY Right 08/23/2023   US  RT BREAST BX W LOC DEV EA ADD LESION IMG BX SPEC US  GUIDE 08/23/2023 [facility]   BREAST BIOPSY Right 08/23/2023   MM RT BREAST BX W LOC DEV 1ST LESION IMAGE BX SPEC STEREO GUIDE 08/23/2023 [facility]   BREAST BIOPSY  09/11/2023   MM RT RADIOACTIVE SEED EA ADD LESION LOC MAMMO GUIDE 09/11/2023 [facility]   BREAST BIOPSY  09/11/2023   MM RT RADIOACTIVE SEED EA ADD LESION LOC MAMMO GUIDE 09/11/2023 [facility]   BREAST BIOPSY  09/11/2023   MM RT RADIOACTIVE SEED LOC MAMMO GUIDE 09/11/2023 [facility]   BREAST LUMPECTOMY Right 10/08/2023   Procedure: BREAST LUMPECTOMY;  Surgeon: Ebbie Cough, MD;  Location: [facility];  Service: General;  Laterality: Right;  RIGHT BREAST MARGIN RE-EXCISION   BREAST LUMPECTOMY Right 11/06/2023   Procedure: RIGHT BREAST RE-EXCISION LUMPECTOMY;  Surgeon: Ebbie Cough, MD;  Location: [facility];  Service: General;   Laterality: Right;   BREAST LUMPECTOMY WITH RADIOACTIVE SEED LOCALIZATION Right 09/13/2023   Procedure: BREAST LUMPECTOMY WITH RADIOACTIVE SEED LOCALIZATION;  Surgeon: Ebbie Cough, MD;  Location: [facility];  Service: General;  Laterality: Right;  Right breast radioactive seed bracketed lumpectomy   BUNIONECTOMY WITH WEIL OSTEOTOMY Right 09/06/2017   Procedure: Right Lapidus, Modified Woodard Edwards;  Surgeon: Kit Rush, MD;  Location: [facility];  Service: Orthopedics;  Laterality: Right;   COLONOSCOPY WITH PROPOFOL   08/08/2016   HAMMERTOE RECONSTRUCTION WITH WEIL OSTEOTOMY  06/27/2018   Procedure: LEFT SECOND HAMMERTOE RECONSTRUCTION WITH WEIL OSTEOTOMY;  Surgeon: Kit Rush, MD;  Location: [facility];  Service: Orthopedics;;   PORTACATH PLACEMENT Right 09/13/2023   Procedure: INSERTION, TUNNELED CENTRAL VENOUS DEVICE, WITH PORT;  Surgeon: Ebbie Cough, MD;  Location: [facility];  Service: General;  Laterality: Right;  GEN w/PEC BLOCK   TARSAL METATARSAL ARTHRODESIS Left 06/27/2018   Procedure: Left modified McBride and lapidus 1st tarsometatarsal arthrodesis;  Surgeon: Kit Rush, MD;  Location: [facility];  Service: Orthopedics;  Laterality: Left;    TONSILLECTOMY AND ADENOIDECTOMY  1954    Family History  Problem Relation Age of Onset   Colon polyps Mother    Lung cancer Mother        d. 51   Colon polyps Father    Melanoma Father  Colon cancer Brother        and polyps; dx 96s   Lung cancer Maternal Aunt    Lung cancer Maternal Grandfather    Breast cancer Niece        dx 72s   Social History:  reports that she has never smoked. She has never used smokeless tobacco. She reports that she does not currently use alcohol . She reports that she does not use drugs.  Allergies:  Allergies  Allergen Reactions   Nitrates, Organic Other (See Comments)    MIGRAINES   Oxycodone  Other  (See Comments)    Hallucinations     Medications Prior to Admission  Medication Sig Dispense Refill   cholecalciferol (VITAMIN D) 1000 units tablet Take 1,000 Units by mouth daily.     ciprofloxacin  (CIPRO ) 500 MG tablet Take 1 tablet (500 mg total) by mouth 2 (two) times daily for 7 days. 14 tablet 0   ibuprofen (ADVIL,MOTRIN) 800 MG tablet Take 800 mg by mouth every 8 (eight) hours as needed. migraines     Iron, Ferrous Sulfate, 325 (65 Fe) MG TABS      Multiple Vitamins-Minerals (PRESERVISION AREDS 2) CAPS Take by mouth.     Pseudoephedrine-Acetaminophen  (ALLEREST NO DROWSINESS PO) Take by mouth.     lidocaine -prilocaine  (EMLA ) cream Apply to affected area once 30 g 3   ondansetron  (ZOFRAN ) 8 MG tablet Take 1 tablet (8 mg total) by mouth every 8 (eight) hours as needed for nausea or vomiting. Start on the third day after chemotherapy. 30 tablet 1   prochlorperazine  (COMPAZINE ) 10 MG tablet Take 1 tablet (10 mg total) by mouth every 6 (six) hours as needed for nausea or vomiting. 30 tablet 1    No results found for this or any previous visit (from the past 48 hours). No results found.  Review of Systems  All other systems reviewed and are negative.   Blood pressure (!) 128/53, pulse 87, temperature 98.4 F (36.9 C), temperature source Oral, resp. rate 16, height 5' 2 (1.575 m), weight 53.7 kg, SpO2 100%. Physical Exam Vitals reviewed.  Constitutional:      Appearance: Normal appearance.  Cardiovascular:     Rate and Rhythm: Normal rate.  Pulmonary:     Effort: Pulmonary effort is normal.  Neurological:     Mental Status: She is alert.      Assessment/Plan Breast cancer  Port removal today  Heather Bury, MD 02/12/2024, 1:31 PM

## 2024-02-13 ENCOUNTER — Encounter (HOSPITAL_BASED_OUTPATIENT_CLINIC_OR_DEPARTMENT_OTHER): Payer: Self-pay | Admitting: General Surgery

## 2024-02-19 ENCOUNTER — Ambulatory Visit: Admitting: Radiation Oncology

## 2024-02-21 ENCOUNTER — Ambulatory Visit
Admission: RE | Admit: 2024-02-21 | Discharge: 2024-02-21 | Disposition: A | Discharge status: Home / Self Care | Source: Ambulatory Visit | Attending: Radiation Oncology | Admitting: Radiation Oncology

## 2024-02-21 DIAGNOSIS — C50411 Malignant neoplasm of upper-outer quadrant of right female breast: Secondary | ICD-10-CM | POA: Diagnosis present

## 2024-02-21 DIAGNOSIS — Z510 Encounter for antineoplastic radiation therapy: Secondary | ICD-10-CM | POA: Diagnosis present

## 2024-02-21 DIAGNOSIS — Z1732 Human epidermal growth factor receptor 2 negative status: Secondary | ICD-10-CM | POA: Diagnosis not present

## 2024-02-21 DIAGNOSIS — Z1722 Progesterone receptor negative status: Secondary | ICD-10-CM | POA: Diagnosis not present

## 2024-02-21 DIAGNOSIS — Z171 Estrogen receptor negative status [ER-]: Secondary | ICD-10-CM | POA: Diagnosis not present

## 2024-02-25 ENCOUNTER — Encounter: Payer: Self-pay | Admitting: *Deleted

## 2024-02-25 DIAGNOSIS — C50411 Malignant neoplasm of upper-outer quadrant of right female breast: Secondary | ICD-10-CM

## 2024-02-25 DIAGNOSIS — Z510 Encounter for antineoplastic radiation therapy: Secondary | ICD-10-CM | POA: Diagnosis not present

## 2024-02-26 ENCOUNTER — Telehealth: Payer: Self-pay | Admitting: Hematology and Oncology

## 2024-02-26 NOTE — Telephone Encounter (Signed)
 left vm for pt about scheduled appt date and time

## 2024-02-27 ENCOUNTER — Other Ambulatory Visit: Payer: Self-pay

## 2024-02-28 ENCOUNTER — Other Ambulatory Visit: Payer: Self-pay

## 2024-02-28 ENCOUNTER — Ambulatory Visit
Admission: RE | Admit: 2024-02-28 | Discharge: 2024-02-28 | Disposition: A | Discharge status: Home / Self Care | Source: Ambulatory Visit | Attending: Radiation Oncology | Admitting: Radiation Oncology

## 2024-02-28 DIAGNOSIS — Z510 Encounter for antineoplastic radiation therapy: Secondary | ICD-10-CM | POA: Diagnosis not present

## 2024-02-28 LAB — RAD ONC ARIA SESSION SUMMARY
Course Elapsed Days: 0
Plan Fractions Treated to Date: 1
Plan Prescribed Dose Per Fraction: 2.66 Gy
Plan Total Fractions Prescribed: 16
Plan Total Prescribed Dose: 42.56 Gy
Reference Point Dosage Given to Date: 2.66 Gy
Reference Point Session Dosage Given: 2.66 Gy
Session Number: 1

## 2024-02-29 ENCOUNTER — Ambulatory Visit
Admission: RE | Admit: 2024-02-29 | Discharge: 2024-02-29 | Disposition: A | Discharge status: Home / Self Care | Source: Ambulatory Visit | Attending: Radiation Oncology | Admitting: Radiation Oncology

## 2024-02-29 ENCOUNTER — Other Ambulatory Visit: Payer: Self-pay

## 2024-02-29 DIAGNOSIS — Z510 Encounter for antineoplastic radiation therapy: Secondary | ICD-10-CM | POA: Diagnosis not present

## 2024-02-29 LAB — RAD ONC ARIA SESSION SUMMARY
Course Elapsed Days: 1
Plan Fractions Treated to Date: 2
Plan Prescribed Dose Per Fraction: 2.66 Gy
Plan Total Fractions Prescribed: 16
Plan Total Prescribed Dose: 42.56 Gy
Reference Point Dosage Given to Date: 5.32 Gy
Reference Point Session Dosage Given: 2.66 Gy
Session Number: 2

## 2024-03-03 ENCOUNTER — Other Ambulatory Visit: Payer: Self-pay

## 2024-03-03 ENCOUNTER — Ambulatory Visit
Admission: RE | Admit: 2024-03-03 | Discharge: 2024-03-03 | Disposition: A | Discharge status: Home / Self Care | Source: Ambulatory Visit | Attending: Radiation Oncology | Admitting: Radiation Oncology

## 2024-03-03 DIAGNOSIS — Z510 Encounter for antineoplastic radiation therapy: Secondary | ICD-10-CM | POA: Diagnosis not present

## 2024-03-03 LAB — RAD ONC ARIA SESSION SUMMARY
Course Elapsed Days: 4
Plan Fractions Treated to Date: 3
Plan Prescribed Dose Per Fraction: 2.66 Gy
Plan Total Fractions Prescribed: 16
Plan Total Prescribed Dose: 42.56 Gy
Reference Point Dosage Given to Date: 7.98 Gy
Reference Point Session Dosage Given: 2.66 Gy
Session Number: 3

## 2024-03-04 ENCOUNTER — Ambulatory Visit
Admission: RE | Admit: 2024-03-04 | Discharge: 2024-03-04 | Disposition: A | Discharge status: Home / Self Care | Source: Ambulatory Visit | Attending: Radiation Oncology | Admitting: Radiation Oncology

## 2024-03-04 ENCOUNTER — Other Ambulatory Visit: Payer: Self-pay

## 2024-03-04 DIAGNOSIS — Z510 Encounter for antineoplastic radiation therapy: Secondary | ICD-10-CM | POA: Diagnosis not present

## 2024-03-04 LAB — RAD ONC ARIA SESSION SUMMARY
Course Elapsed Days: 5
Plan Fractions Treated to Date: 4
Plan Prescribed Dose Per Fraction: 2.66 Gy
Plan Total Fractions Prescribed: 16
Plan Total Prescribed Dose: 42.56 Gy
Reference Point Dosage Given to Date: 10.64 Gy
Reference Point Session Dosage Given: 2.66 Gy
Session Number: 4

## 2024-03-05 ENCOUNTER — Other Ambulatory Visit: Payer: Self-pay

## 2024-03-05 ENCOUNTER — Ambulatory Visit
Admission: RE | Admit: 2024-03-05 | Discharge: 2024-03-05 | Disposition: A | Discharge status: Home / Self Care | Source: Ambulatory Visit | Attending: Radiation Oncology | Admitting: Radiation Oncology

## 2024-03-05 DIAGNOSIS — Z510 Encounter for antineoplastic radiation therapy: Secondary | ICD-10-CM | POA: Diagnosis not present

## 2024-03-05 LAB — RAD ONC ARIA SESSION SUMMARY
Course Elapsed Days: 6
Plan Fractions Treated to Date: 5
Plan Prescribed Dose Per Fraction: 2.66 Gy
Plan Total Fractions Prescribed: 16
Plan Total Prescribed Dose: 42.56 Gy
Reference Point Dosage Given to Date: 13.3 Gy
Reference Point Session Dosage Given: 2.66 Gy
Session Number: 5

## 2024-03-06 ENCOUNTER — Ambulatory Visit
Admission: RE | Admit: 2024-03-06 | Discharge: 2024-03-06 | Disposition: A | Discharge status: Home / Self Care | Source: Ambulatory Visit | Attending: Radiation Oncology

## 2024-03-06 ENCOUNTER — Other Ambulatory Visit: Payer: Self-pay

## 2024-03-06 DIAGNOSIS — Z510 Encounter for antineoplastic radiation therapy: Secondary | ICD-10-CM | POA: Diagnosis not present

## 2024-03-06 LAB — RAD ONC ARIA SESSION SUMMARY
Course Elapsed Days: 7
Plan Fractions Treated to Date: 6
Plan Prescribed Dose Per Fraction: 2.66 Gy
Plan Total Fractions Prescribed: 16
Plan Total Prescribed Dose: 42.56 Gy
Reference Point Dosage Given to Date: 15.96 Gy
Reference Point Session Dosage Given: 2.66 Gy
Session Number: 6

## 2024-03-07 ENCOUNTER — Ambulatory Visit
Admission: RE | Admit: 2024-03-07 | Discharge: 2024-03-07 | Disposition: A | Discharge status: Home / Self Care | Source: Ambulatory Visit | Attending: Radiation Oncology | Admitting: Radiation Oncology

## 2024-03-07 ENCOUNTER — Other Ambulatory Visit: Payer: Self-pay

## 2024-03-07 DIAGNOSIS — Z510 Encounter for antineoplastic radiation therapy: Secondary | ICD-10-CM | POA: Diagnosis not present

## 2024-03-07 LAB — RAD ONC ARIA SESSION SUMMARY
Course Elapsed Days: 8
Plan Fractions Treated to Date: 7
Plan Prescribed Dose Per Fraction: 2.66 Gy
Plan Total Fractions Prescribed: 16
Plan Total Prescribed Dose: 42.56 Gy
Reference Point Dosage Given to Date: 18.62 Gy
Reference Point Session Dosage Given: 2.66 Gy
Session Number: 7

## 2024-03-10 ENCOUNTER — Ambulatory Visit
Admission: RE | Admit: 2024-03-10 | Discharge: 2024-03-10 | Disposition: A | Discharge status: Home / Self Care | Source: Ambulatory Visit | Attending: Radiation Oncology

## 2024-03-10 ENCOUNTER — Other Ambulatory Visit: Payer: Self-pay

## 2024-03-10 DIAGNOSIS — Z510 Encounter for antineoplastic radiation therapy: Secondary | ICD-10-CM | POA: Diagnosis not present

## 2024-03-10 LAB — RAD ONC ARIA SESSION SUMMARY
Course Elapsed Days: 11
Plan Fractions Treated to Date: 8
Plan Prescribed Dose Per Fraction: 2.66 Gy
Plan Total Fractions Prescribed: 16
Plan Total Prescribed Dose: 42.56 Gy
Reference Point Dosage Given to Date: 21.28 Gy
Reference Point Session Dosage Given: 2.66 Gy
Session Number: 8

## 2024-03-11 ENCOUNTER — Other Ambulatory Visit: Payer: Self-pay

## 2024-03-11 ENCOUNTER — Ambulatory Visit
Admission: RE | Admit: 2024-03-11 | Discharge: 2024-03-11 | Disposition: A | Discharge status: Home / Self Care | Source: Ambulatory Visit | Attending: Radiation Oncology | Admitting: Radiation Oncology

## 2024-03-11 DIAGNOSIS — Z510 Encounter for antineoplastic radiation therapy: Secondary | ICD-10-CM | POA: Diagnosis not present

## 2024-03-11 LAB — RAD ONC ARIA SESSION SUMMARY
Course Elapsed Days: 12
Plan Fractions Treated to Date: 9
Plan Prescribed Dose Per Fraction: 2.66 Gy
Plan Total Fractions Prescribed: 16
Plan Total Prescribed Dose: 42.56 Gy
Reference Point Dosage Given to Date: 23.94 Gy
Reference Point Session Dosage Given: 2.66 Gy
Session Number: 9

## 2024-03-12 ENCOUNTER — Other Ambulatory Visit: Payer: Self-pay

## 2024-03-12 ENCOUNTER — Ambulatory Visit
Admission: RE | Admit: 2024-03-12 | Discharge: 2024-03-12 | Disposition: A | Discharge status: Home / Self Care | Source: Ambulatory Visit | Attending: Radiation Oncology | Admitting: Radiation Oncology

## 2024-03-12 DIAGNOSIS — Z510 Encounter for antineoplastic radiation therapy: Secondary | ICD-10-CM | POA: Diagnosis not present

## 2024-03-12 LAB — RAD ONC ARIA SESSION SUMMARY
Course Elapsed Days: 13
Plan Fractions Treated to Date: 10
Plan Prescribed Dose Per Fraction: 2.66 Gy
Plan Total Fractions Prescribed: 16
Plan Total Prescribed Dose: 42.56 Gy
Reference Point Dosage Given to Date: 26.6 Gy
Reference Point Session Dosage Given: 2.66 Gy
Session Number: 10

## 2024-03-13 ENCOUNTER — Ambulatory Visit
Admission: RE | Admit: 2024-03-13 | Discharge: 2024-03-13 | Disposition: A | Discharge status: Home / Self Care | Source: Ambulatory Visit | Attending: Radiation Oncology

## 2024-03-13 ENCOUNTER — Other Ambulatory Visit: Payer: Self-pay

## 2024-03-13 DIAGNOSIS — Z510 Encounter for antineoplastic radiation therapy: Secondary | ICD-10-CM | POA: Diagnosis not present

## 2024-03-13 LAB — RAD ONC ARIA SESSION SUMMARY
Course Elapsed Days: 14
Plan Fractions Treated to Date: 11
Plan Prescribed Dose Per Fraction: 2.66 Gy
Plan Total Fractions Prescribed: 16
Plan Total Prescribed Dose: 42.56 Gy
Reference Point Dosage Given to Date: 29.26 Gy
Reference Point Session Dosage Given: 2.66 Gy
Session Number: 11

## 2024-03-14 ENCOUNTER — Ambulatory Visit
Admission: RE | Admit: 2024-03-14 | Discharge: 2024-03-14 | Disposition: A | Discharge status: Home / Self Care | Source: Ambulatory Visit | Attending: Radiation Oncology | Admitting: Radiation Oncology

## 2024-03-14 ENCOUNTER — Other Ambulatory Visit: Payer: Self-pay

## 2024-03-14 DIAGNOSIS — Z510 Encounter for antineoplastic radiation therapy: Secondary | ICD-10-CM | POA: Diagnosis not present

## 2024-03-14 LAB — RAD ONC ARIA SESSION SUMMARY
Course Elapsed Days: 15
Plan Fractions Treated to Date: 12
Plan Prescribed Dose Per Fraction: 2.66 Gy
Plan Total Fractions Prescribed: 16
Plan Total Prescribed Dose: 42.56 Gy
Reference Point Dosage Given to Date: 31.92 Gy
Reference Point Session Dosage Given: 2.66 Gy
Session Number: 12

## 2024-03-17 ENCOUNTER — Other Ambulatory Visit: Payer: Self-pay

## 2024-03-17 ENCOUNTER — Ambulatory Visit
Admission: RE | Admit: 2024-03-17 | Discharge: 2024-03-17 | Disposition: A | Discharge status: Home / Self Care | Source: Ambulatory Visit | Attending: Radiation Oncology | Admitting: Radiation Oncology

## 2024-03-17 DIAGNOSIS — Z510 Encounter for antineoplastic radiation therapy: Secondary | ICD-10-CM | POA: Diagnosis not present

## 2024-03-17 LAB — RAD ONC ARIA SESSION SUMMARY
Course Elapsed Days: 18
Plan Fractions Treated to Date: 13
Plan Prescribed Dose Per Fraction: 2.66 Gy
Plan Total Fractions Prescribed: 16
Plan Total Prescribed Dose: 42.56 Gy
Reference Point Dosage Given to Date: 34.58 Gy
Reference Point Session Dosage Given: 2.66 Gy
Session Number: 13

## 2024-03-18 ENCOUNTER — Ambulatory Visit
Admission: RE | Admit: 2024-03-18 | Discharge: 2024-03-18 | Disposition: A | Source: Ambulatory Visit | Attending: Radiation Oncology | Admitting: Radiation Oncology

## 2024-03-18 ENCOUNTER — Ambulatory Visit
Admission: RE | Admit: 2024-03-18 | Discharge: 2024-03-18 | Disposition: A | Source: Ambulatory Visit | Attending: Radiation Oncology

## 2024-03-18 ENCOUNTER — Other Ambulatory Visit: Payer: Self-pay

## 2024-03-18 DIAGNOSIS — Z510 Encounter for antineoplastic radiation therapy: Secondary | ICD-10-CM | POA: Diagnosis not present

## 2024-03-18 LAB — RAD ONC ARIA SESSION SUMMARY
Course Elapsed Days: 19
Plan Fractions Treated to Date: 14
Plan Prescribed Dose Per Fraction: 2.66 Gy
Plan Total Fractions Prescribed: 16
Plan Total Prescribed Dose: 42.56 Gy
Reference Point Dosage Given to Date: 37.24 Gy
Reference Point Session Dosage Given: 2.66 Gy
Session Number: 14

## 2024-03-19 ENCOUNTER — Other Ambulatory Visit: Payer: Self-pay

## 2024-03-19 ENCOUNTER — Ambulatory Visit
Admission: RE | Admit: 2024-03-19 | Discharge: 2024-03-19 | Disposition: A | Discharge status: Home / Self Care | Source: Ambulatory Visit | Attending: Radiation Oncology | Admitting: Radiation Oncology

## 2024-03-19 DIAGNOSIS — Z1722 Progesterone receptor negative status: Secondary | ICD-10-CM | POA: Insufficient documentation

## 2024-03-19 DIAGNOSIS — C50411 Malignant neoplasm of upper-outer quadrant of right female breast: Secondary | ICD-10-CM | POA: Diagnosis present

## 2024-03-19 DIAGNOSIS — Z171 Estrogen receptor negative status [ER-]: Secondary | ICD-10-CM | POA: Insufficient documentation

## 2024-03-19 DIAGNOSIS — Z510 Encounter for antineoplastic radiation therapy: Secondary | ICD-10-CM | POA: Diagnosis present

## 2024-03-19 DIAGNOSIS — Z1732 Human epidermal growth factor receptor 2 negative status: Secondary | ICD-10-CM | POA: Diagnosis not present

## 2024-03-19 LAB — RAD ONC ARIA SESSION SUMMARY
Course Elapsed Days: 20
Plan Fractions Treated to Date: 15
Plan Prescribed Dose Per Fraction: 2.66 Gy
Plan Total Fractions Prescribed: 16
Plan Total Prescribed Dose: 42.56 Gy
Reference Point Dosage Given to Date: 39.9 Gy
Reference Point Session Dosage Given: 2.66 Gy
Session Number: 15

## 2024-03-20 ENCOUNTER — Ambulatory Visit
Admission: RE | Admit: 2024-03-20 | Discharge: 2024-03-20 | Disposition: A | Discharge status: Home / Self Care | Source: Ambulatory Visit | Attending: Radiation Oncology | Admitting: Radiation Oncology

## 2024-03-20 ENCOUNTER — Other Ambulatory Visit: Payer: Self-pay

## 2024-03-20 DIAGNOSIS — Z510 Encounter for antineoplastic radiation therapy: Secondary | ICD-10-CM | POA: Diagnosis not present

## 2024-03-20 LAB — RAD ONC ARIA SESSION SUMMARY
Course Elapsed Days: 21
Plan Fractions Treated to Date: 16
Plan Prescribed Dose Per Fraction: 2.66 Gy
Plan Total Fractions Prescribed: 16
Plan Total Prescribed Dose: 42.56 Gy
Reference Point Dosage Given to Date: 42.56 Gy
Reference Point Session Dosage Given: 2.66 Gy
Session Number: 16

## 2024-03-21 ENCOUNTER — Other Ambulatory Visit: Payer: Self-pay

## 2024-03-21 ENCOUNTER — Ambulatory Visit: Admission: RE | Admit: 2024-03-21 | Discharge: 2024-03-21 | Attending: Radiation Oncology

## 2024-03-21 DIAGNOSIS — Z510 Encounter for antineoplastic radiation therapy: Secondary | ICD-10-CM | POA: Diagnosis not present

## 2024-03-21 LAB — RAD ONC ARIA SESSION SUMMARY
Course Elapsed Days: 22
Plan Fractions Treated to Date: 1
Plan Prescribed Dose Per Fraction: 2 Gy
Plan Total Fractions Prescribed: 5
Plan Total Prescribed Dose: 10 Gy
Reference Point Dosage Given to Date: 2 Gy
Reference Point Session Dosage Given: 2 Gy
Session Number: 17

## 2024-03-24 ENCOUNTER — Ambulatory Visit
Admission: RE | Admit: 2024-03-24 | Discharge: 2024-03-24 | Disposition: A | Source: Ambulatory Visit | Attending: Radiation Oncology

## 2024-03-24 ENCOUNTER — Ambulatory Visit
Admission: RE | Admit: 2024-03-24 | Discharge: 2024-03-24 | Disposition: A | Source: Ambulatory Visit | Attending: Radiation Oncology | Admitting: Radiation Oncology

## 2024-03-24 ENCOUNTER — Other Ambulatory Visit: Payer: Self-pay

## 2024-03-24 DIAGNOSIS — Z510 Encounter for antineoplastic radiation therapy: Secondary | ICD-10-CM | POA: Diagnosis not present

## 2024-03-24 LAB — RAD ONC ARIA SESSION SUMMARY
Course Elapsed Days: 25
Plan Fractions Treated to Date: 2
Plan Prescribed Dose Per Fraction: 2 Gy
Plan Total Fractions Prescribed: 5
Plan Total Prescribed Dose: 10 Gy
Reference Point Dosage Given to Date: 4 Gy
Reference Point Session Dosage Given: 2 Gy
Session Number: 18

## 2024-03-25 ENCOUNTER — Ambulatory Visit
Admission: RE | Admit: 2024-03-25 | Discharge: 2024-03-25 | Disposition: A | Discharge status: Home / Self Care | Source: Ambulatory Visit | Attending: Radiation Oncology | Admitting: Radiation Oncology

## 2024-03-25 ENCOUNTER — Ambulatory Visit
Admission: RE | Admit: 2024-03-25 | Discharge: 2024-03-25 | Disposition: A | Source: Ambulatory Visit | Attending: Radiation Oncology

## 2024-03-25 ENCOUNTER — Other Ambulatory Visit: Payer: Self-pay

## 2024-03-25 DIAGNOSIS — Z510 Encounter for antineoplastic radiation therapy: Secondary | ICD-10-CM | POA: Diagnosis not present

## 2024-03-25 LAB — RAD ONC ARIA SESSION SUMMARY
Course Elapsed Days: 26
Plan Fractions Treated to Date: 3
Plan Prescribed Dose Per Fraction: 2 Gy
Plan Total Fractions Prescribed: 5
Plan Total Prescribed Dose: 10 Gy
Reference Point Dosage Given to Date: 6 Gy
Reference Point Session Dosage Given: 2 Gy
Session Number: 19

## 2024-03-26 ENCOUNTER — Ambulatory Visit
Admission: RE | Admit: 2024-03-26 | Discharge: 2024-03-26 | Disposition: A | Source: Ambulatory Visit | Attending: Radiation Oncology

## 2024-03-26 ENCOUNTER — Other Ambulatory Visit: Payer: Self-pay

## 2024-03-26 DIAGNOSIS — Z510 Encounter for antineoplastic radiation therapy: Secondary | ICD-10-CM | POA: Diagnosis not present

## 2024-03-26 LAB — RAD ONC ARIA SESSION SUMMARY
Course Elapsed Days: 27
Plan Fractions Treated to Date: 4
Plan Prescribed Dose Per Fraction: 2 Gy
Plan Total Fractions Prescribed: 5
Plan Total Prescribed Dose: 10 Gy
Reference Point Dosage Given to Date: 8 Gy
Reference Point Session Dosage Given: 2 Gy
Session Number: 20

## 2024-03-27 ENCOUNTER — Ambulatory Visit
Admission: RE | Admit: 2024-03-27 | Discharge: 2024-03-27 | Disposition: A | Source: Ambulatory Visit | Attending: Radiation Oncology

## 2024-03-27 ENCOUNTER — Other Ambulatory Visit: Payer: Self-pay

## 2024-03-27 DIAGNOSIS — Z510 Encounter for antineoplastic radiation therapy: Secondary | ICD-10-CM | POA: Diagnosis not present

## 2024-03-27 LAB — RAD ONC ARIA SESSION SUMMARY
Course Elapsed Days: 28
Plan Fractions Treated to Date: 5
Plan Prescribed Dose Per Fraction: 2 Gy
Plan Total Fractions Prescribed: 5
Plan Total Prescribed Dose: 10 Gy
Reference Point Dosage Given to Date: 10 Gy
Reference Point Session Dosage Given: 2 Gy
Session Number: 21

## 2024-03-28 NOTE — Radiation Completion Notes (Signed)
 Patient Name: Heather Lewis, Heather Lewis MRN: [id number] Date of Birth: 06-08-1946 Referring Physician: AMBER STALLS, M.D. Date of Service: 2024-03-28 Radiation Oncologist: Lynwood Cedar, M.D. [facility]                             RADIATION ONCOLOGY END OF TREATMENT NOTE     Diagnosis: C50.411 Malignant neoplasm of upper-outer quadrant of right female breast Staging on 2023-08-29: Malignant neoplasm of upper-outer quadrant of right breast in female, estrogen receptor negative (HCC) T=cT1c, N=cN0, M=cM0 Intent: Curative     ==========DELIVERED PLANS==========  First Treatment Date: 2024-02-28 Last Treatment Date: 2024-03-27   Plan Name: Breast_R Site: Breast, Right Technique: 3D Mode: Photon Dose Per Fraction: 2.66 Gy Prescribed Dose (Delivered / Prescribed): 42.56 Gy / 42.56 Gy Prescribed Fxs (Delivered / Prescribed): 16 / 16   Plan Name: Breast_R_Bst Site: Breast, Right Technique: Electron Mode: Electron Dose Per Fraction: 2 Gy Prescribed Dose (Delivered / Prescribed): 10 Gy / 10 Gy Prescribed Fxs (Delivered / Prescribed): 5 / 5     ==========ON TREATMENT VISIT DATES========== 2024-03-04, 2024-03-11, 2024-03-18, 2024-03-25     ==========UPCOMING VISITS========== 04/24/2024 [facility] RAD ONC FOLLOW UP 20 Cedar Lynwood, MD  04/01/2024 [facility] EST PT 15 STALLS AMBER, MD  03/31/2024 [facility] REH AT [facility] SCREEN Aden Kins A, PTA        ==========APPENDIX - ON TREATMENT VISIT NOTES==========   See weekly On Treatment Notes in Epic for details in the Media tab (listed as Progress notes on the On Treatment Visit Dates listed above).

## 2024-03-31 ENCOUNTER — Ambulatory Visit: Attending: Hematology and Oncology

## 2024-03-31 ENCOUNTER — Telehealth: Payer: Self-pay

## 2024-03-31 VITALS — Wt 112.5 lb

## 2024-03-31 DIAGNOSIS — Z483 Aftercare following surgery for neoplasm: Secondary | ICD-10-CM | POA: Insufficient documentation

## 2024-03-31 NOTE — Therapy (Signed)
 OUTPATIENT PHYSICAL THERAPY SOZO SCREENING NOTE   Patient Name: Heather Lewis MRN: [id number] DOB:February 24, 1946, 78 y.o., female Today's Date: 03/31/2024  PCP: Thurmond Cathlyn LABOR., MD REFERRING PROVIDER: Loretha Ash, MD   PT End of Session - 03/31/24 [phone]     Visit Number 2   # unchanged due to screen only   PT Start Time 0828    PT Stop Time 0832    PT Time Calculation (min) 4 min    Activity Tolerance Patient tolerated treatment well    Behavior During Therapy [facility] for tasks assessed/performed          Past Medical History:  Diagnosis Date   Age-related macular degeneration    Bunion of right foot 08/2017   Cancer (HCC) 08/2023   right breast ICD with DCIS   Complication of anesthesia    hard to wake up from colonoscopy when Propofol  not used   Dental crowns present    Metatarsalgia of right foot 08/2017   2nd toe   Migraines    with change of weather   TMJ syndrome    Past Surgical History:  Procedure Laterality Date   APPENDECTOMY  1960   AXILLARY SENTINEL NODE BIOPSY Right 09/13/2023   Procedure: BIOPSY, LYMPH NODE, SENTINEL, AXILLARY;  Surgeon: Ebbie Cough, MD;  Location: [facility];  Service: General;  Laterality: Right;  right   BREAST BIOPSY Right 08/23/2023   US  RT BREAST BX W LOC DEV 1ST LESION IMG BX SPEC US  GUIDE 08/23/2023 [facility]   BREAST BIOPSY Right 08/23/2023   US  RT BREAST BX W LOC DEV EA ADD LESION IMG BX SPEC US  GUIDE 08/23/2023 [facility]   BREAST BIOPSY Right 08/23/2023   MM RT BREAST BX W LOC DEV 1ST LESION IMAGE BX SPEC STEREO GUIDE 08/23/2023 [facility]   BREAST BIOPSY  09/11/2023   MM RT RADIOACTIVE SEED EA ADD LESION LOC MAMMO GUIDE 09/11/2023 [facility]   BREAST BIOPSY  09/11/2023   MM RT RADIOACTIVE SEED EA ADD LESION LOC MAMMO GUIDE 09/11/2023 [facility]   BREAST BIOPSY  09/11/2023   MM RT RADIOACTIVE SEED LOC MAMMO GUIDE 09/11/2023 [facility]   BREAST LUMPECTOMY Right 10/08/2023    Procedure: BREAST LUMPECTOMY;  Surgeon: Ebbie Cough, MD;  Location: [facility];  Service: General;  Laterality: Right;  RIGHT BREAST MARGIN RE-EXCISION   BREAST LUMPECTOMY Right 11/06/2023   Procedure: RIGHT BREAST RE-EXCISION LUMPECTOMY;  Surgeon: Ebbie Cough, MD;  Location: [facility];  Service: General;  Laterality: Right;   BREAST LUMPECTOMY WITH RADIOACTIVE SEED LOCALIZATION Right 09/13/2023   Procedure: BREAST LUMPECTOMY WITH RADIOACTIVE SEED LOCALIZATION;  Surgeon: Ebbie Cough, MD;  Location: [facility];  Service: General;  Laterality: Right;  Right breast radioactive seed bracketed lumpectomy   BUNIONECTOMY WITH WEIL OSTEOTOMY Right 09/06/2017   Procedure: Right Lapidus, Modified Woodard Edwards;  Surgeon: Kit Rush, MD;  Location: [facility];  Service: Orthopedics;  Laterality: Right;   COLONOSCOPY WITH PROPOFOL   08/08/2016   HAMMERTOE RECONSTRUCTION WITH WEIL OSTEOTOMY  06/27/2018   Procedure: LEFT SECOND HAMMERTOE RECONSTRUCTION WITH WEIL OSTEOTOMY;  Surgeon: Kit Rush, MD;  Location: [facility];  Service: Orthopedics;;   [facility] REMOVAL Right 02/12/2024   Procedure: REMOVAL PORT-A-CATH;  Surgeon: Ebbie Cough, MD;  Location:  [facility];  Service: General;  Laterality: Right;   PORTACATH PLACEMENT Right 09/13/2023   Procedure: INSERTION, TUNNELED CENTRAL VENOUS DEVICE, WITH PORT;  Surgeon: Ebbie,  Donnice, MD;  Location: [facility];  Service: General;  Laterality: Right;  GEN w/PEC BLOCK   TARSAL METATARSAL ARTHRODESIS Left 06/27/2018   Procedure: Left modified McBride and lapidus 1st tarsometatarsal arthrodesis;  Surgeon: Kit Rush, MD;  Location: [facility];  Service: Orthopedics;  Laterality: Left;    TONSILLECTOMY AND ADENOIDECTOMY  1954   Patient Active Problem List   Diagnosis Date Noted   Port-A-Cath in place  11/26/2023   Genetic testing 09/25/2023   Malignant neoplasm of upper-outer quadrant of right breast in female, estrogen receptor negative (HCC) 08/28/2023    REFERRING DIAG: right breast cancer at risk for lymphedema  THERAPY DIAG: Aftercare following surgery for neoplasm  PERTINENT HISTORY: Rt lumpectomy with SLNB 09/13/23 with 3 negative nodes removed. Then re-excision 10/08/23 with more positive margins so pt will have a 2nd re-excision on 11/06/23.  Then will have chemotherapy and radiation. Hx: Patient was diagnosed on 08/23/2023 with right grade 3 invasive ductal carcinoma breast cancer. It measured 1.9 cm and was located in the upper outer quadrant. It is triple negative with a Ki67 of 30%. She has a history of a left femur fracture with surgery 02/03/2023.   PRECAUTIONS: right UE Lymphedema risk, None  SUBJECTIVE: Pt returns for her 3 month L-Dex screen.   PAIN:  Are you having pain? No  SOZO SCREENING: Patient was assessed today using the SOZO machine to determine the lymphedema index score. This was compared to her baseline score. It was determined that she is within the recommended range when compared to her baseline and no further action is needed at this time. She will continue SOZO screenings. These are done every 3 months for 2 years post operatively followed by every 6 months for 2 years, and then annually.   L-DEX FLOWSHEETS - 03/31/24 0800       L-DEX LYMPHEDEMA SCREENING   Measurement Type Unilateral    L-DEX MEASUREMENT EXTREMITY Upper Extremity    POSITION  Standing    DOMINANT SIDE Right    At Risk Side Right    BASELINE SCORE (UNILATERAL) 0.4    L-DEX SCORE (UNILATERAL) 0.5    VALUE CHANGE (UNILAT) 0.1            Aden Berwyn Caldron, PTA 03/31/2024, 8:31 AM

## 2024-03-31 NOTE — Telephone Encounter (Signed)
 Spoke with patient and confirmed appointment on 10/14

## 2024-04-01 ENCOUNTER — Inpatient Hospital Stay: Attending: Hematology and Oncology | Admitting: Hematology and Oncology

## 2024-04-01 ENCOUNTER — Other Ambulatory Visit: Payer: Self-pay

## 2024-04-01 ENCOUNTER — Inpatient Hospital Stay: Attending: Hematology and Oncology

## 2024-04-01 VITALS — BP 98/52 | HR 72 | Temp 98.2°F | Resp 16 | Wt 113.0 lb

## 2024-04-01 DIAGNOSIS — C50411 Malignant neoplasm of upper-outer quadrant of right female breast: Secondary | ICD-10-CM | POA: Insufficient documentation

## 2024-04-01 DIAGNOSIS — E611 Iron deficiency: Secondary | ICD-10-CM | POA: Insufficient documentation

## 2024-04-01 DIAGNOSIS — Z800 Family history of malignant neoplasm of digestive organs: Secondary | ICD-10-CM | POA: Insufficient documentation

## 2024-04-01 DIAGNOSIS — Z803 Family history of malignant neoplasm of breast: Secondary | ICD-10-CM | POA: Insufficient documentation

## 2024-04-01 DIAGNOSIS — Z171 Estrogen receptor negative status [ER-]: Secondary | ICD-10-CM | POA: Insufficient documentation

## 2024-04-01 DIAGNOSIS — Z17421 Hormone receptor negative with human epidermal growth factor receptor 2 negative status: Secondary | ICD-10-CM | POA: Insufficient documentation

## 2024-04-01 DIAGNOSIS — Z923 Personal history of irradiation: Secondary | ICD-10-CM | POA: Diagnosis not present

## 2024-04-01 DIAGNOSIS — Z9221 Personal history of antineoplastic chemotherapy: Secondary | ICD-10-CM | POA: Diagnosis not present

## 2024-04-01 DIAGNOSIS — Z801 Family history of malignant neoplasm of trachea, bronchus and lung: Secondary | ICD-10-CM | POA: Diagnosis not present

## 2024-04-01 LAB — IRON AND IRON BINDING CAPACITY (CC-WL,HP ONLY)
Iron: 64 ug/dL (ref 28–170)
Saturation Ratios: 23 % (ref 10.4–31.8)
TIBC: 284 ug/dL (ref 250–450)
UIBC: 220 ug/dL (ref 148–442)

## 2024-04-01 LAB — CBC WITH DIFFERENTIAL/PLATELET
Abs Immature Granulocytes: 0.01 K/uL (ref 0.00–0.07)
Basophils Absolute: 0 K/uL (ref 0.0–0.1)
Basophils Relative: 0 %
Eosinophils Absolute: 0.1 K/uL (ref 0.0–0.5)
Eosinophils Relative: 1 %
HCT: 38.2 % (ref 36.0–46.0)
Hemoglobin: 12.5 g/dL (ref 12.0–15.0)
Immature Granulocytes: 0 %
Lymphocytes Relative: 17 %
Lymphs Abs: 0.8 K/uL (ref 0.7–4.0)
MCH: 30.2 pg (ref 26.0–34.0)
MCHC: 32.7 g/dL (ref 30.0–36.0)
MCV: 92.3 fL (ref 80.0–100.0)
Monocytes Absolute: 0.4 K/uL (ref 0.1–1.0)
Monocytes Relative: 8 %
Neutro Abs: 3.4 K/uL (ref 1.7–7.7)
Neutrophils Relative %: 74 %
Platelets: 249 K/uL (ref 150–400)
RBC: 4.14 MIL/uL (ref 3.87–5.11)
RDW: 14.2 % (ref 11.5–15.5)
WBC: 4.6 K/uL (ref 4.0–10.5)
nRBC: 0 % (ref 0.0–0.2)

## 2024-04-01 LAB — FERRITIN: Ferritin: 59 ng/mL (ref 11–307)

## 2024-04-01 NOTE — Progress Notes (Signed)
 [facility] Cancer Follow up:    Thurmond Cathlyn LABOR., MD [address] [place] [address]   DIAGNOSIS:  Cancer Staging  Malignant neoplasm of upper-outer quadrant of right breast in female, estrogen receptor negative (HCC) Staging form: Breast, AJCC 8th Edition - Clinical stage from 08/29/2023: Stage IB (cT1c, cN0, cM0, G3, ER-, PR-, HER2-) - Signed by Loretha Ash, MD on 08/29/2023 Stage prefix: Initial diagnosis Histologic grading system: 3 grade system    SUMMARY OF ONCOLOGIC HISTORY: Oncology History  Malignant neoplasm of upper-outer quadrant of right breast in female, estrogen receptor negative (HCC)  08/28/2023 Initial Diagnosis   Malignant neoplasm of upper-outer quadrant of right breast in female, estrogen receptor negative (HCC)    Mammogram   Highly suspicious 1.8 cm palpable mass right breast 10 o'clock position. Highly suspicious 0.9 cm mass noted adjacent to the dominant palpable mass at the 10 o'clock position. The distance between the 2 masses measures less than 1 cm. Indeterminate grouped calcifications in the central outer right breast at posterior depth.    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  Report date is 09/12/2023.   The CancerNext-Expanded gene panel offered by [facility] and includes sequencing, rearrangement, and RNA analysis for the following 76 genes: AIP, ALK, APC, ATM, AXIN2, BAP1, BARD1, BMPR1A, BRCA1, BRCA2, BRIP1, CDC73, CDH1, CDK4, CDKN1B, CDKN2A, CEBPA, CHEK2,  CTNNA1, DDX41, DICER1, ETV6, FH, FLCN, GATA2, LZTR1, MAX, MBD4, MEN1, MET, MLH1, MSH2, MSH3, MSH6, MUTYH, NF1, NF2, NTHL1, PALB2, PHOX2B, PMS2, POT1, PRKAR1A, PTCH1, PTEN, RAD51C, RAD51D, RB1, RET, RUNX1, SDHA, SDHAF2, SDHB, SDHC, SDHD, SMAD4, SMARCA4, SMARCB1, SMARCE1, STK11, SUFU, TMEM127, TP53, TSC1, TSC2, VHL, and WT1 (sequencing and deletion/duplication); EGFR, HOXB13, KIT, MITF, PDGFRA, POLD1, and POLE (sequencing only); EPCAM and GREM1 (deletion/duplication only).    09/13/2023 Definitive Surgery   Right breast lumpectomy, foci of IDC discontinuously involving a fibrotic area of 1.8 cms, grade 3, extensive cancerization of lobules, DCIS high grade, neg margins,  Rt axillary sentinel LN negative. Additional medial margin ( deep resection margin pos for DCIS)   11/26/2023 -  Chemotherapy   Patient is on Treatment Plan : BREAST TC q21d      - 03/27/2024 Radiation Therapy   Completed adj radiation     CURRENT THERAPY: observation  INTERVAL HISTORY:  Discussed the use of AI scribe software for clinical note transcription with the patient, who gave verbal consent to proceed.  History of Present Illness  NAREH MATZKE is a 78 year old female with breast cancer who presents for follow-up after chemotherapy treatment.      Patient Active Problem List   Diagnosis Date Noted   Port-A-Cath in place 11/26/2023   Genetic testing 09/25/2023   Malignant neoplasm of upper-outer quadrant of right breast in female, estrogen receptor negative (HCC) 08/28/2023    is allergic to nitrates, organic and oxycodone .  MEDICAL HISTORY: Past Medical History:  Diagnosis Date   Age-related macular degeneration    Bunion of right foot 08/2017   Cancer (HCC) 08/2023   right breast  ICD with DCIS   Complication of anesthesia    hard to wake up from colonoscopy when Propofol  not used   Dental crowns present    Metatarsalgia of right foot 08/2017   2nd toe   Migraines    with change of weather   TMJ  syndrome     SURGICAL HISTORY: Past Surgical History:  Procedure Laterality Date   APPENDECTOMY  1960   AXILLARY SENTINEL NODE BIOPSY Right 09/13/2023   Procedure: BIOPSY, LYMPH NODE, SENTINEL, AXILLARY;  Surgeon: Ebbie Cough, MD;  Location: [facility];  Service: General;  Laterality: Right;  right   BREAST BIOPSY Right 08/23/2023   US  RT BREAST BX W LOC DEV 1ST LESION IMG BX SPEC US  GUIDE 08/23/2023 [facility]   BREAST BIOPSY Right 08/23/2023   US  RT BREAST BX W LOC DEV EA ADD LESION IMG BX SPEC US  GUIDE 08/23/2023 [facility]   BREAST BIOPSY Right 08/23/2023   MM RT BREAST BX W LOC DEV 1ST LESION IMAGE BX SPEC STEREO GUIDE 08/23/2023 [facility]   BREAST BIOPSY  09/11/2023   MM RT RADIOACTIVE SEED EA ADD LESION LOC MAMMO GUIDE 09/11/2023 [facility]   BREAST BIOPSY  09/11/2023   MM RT RADIOACTIVE SEED EA ADD LESION LOC MAMMO GUIDE 09/11/2023 [facility]   BREAST BIOPSY  09/11/2023   MM RT RADIOACTIVE SEED LOC MAMMO GUIDE 09/11/2023 [facility]   BREAST LUMPECTOMY Right 10/08/2023   Procedure: BREAST LUMPECTOMY;  Surgeon: Ebbie Cough, MD;  Location: [facility];  Service: General;  Laterality: Right;  RIGHT BREAST MARGIN RE-EXCISION   BREAST LUMPECTOMY Right 11/06/2023   Procedure: RIGHT BREAST RE-EXCISION LUMPECTOMY;  Surgeon: Ebbie Cough, MD;  Location: [facility];  Service: General;  Laterality: Right;   BREAST LUMPECTOMY WITH RADIOACTIVE SEED LOCALIZATION Right 09/13/2023   Procedure: BREAST LUMPECTOMY WITH RADIOACTIVE SEED LOCALIZATION;  Surgeon: Ebbie Cough, MD;  Location: [facility];  Service: General;  Laterality: Right;  Right breast radioactive seed bracketed lumpectomy   BUNIONECTOMY WITH WEIL OSTEOTOMY Right 09/06/2017   Procedure: Right Lapidus, Modified Woodard Edwards;  Surgeon: Kit Rush, MD;  Location: [facility];  Service: Orthopedics;   Laterality: Right;   COLONOSCOPY WITH PROPOFOL   08/08/2016   HAMMERTOE RECONSTRUCTION WITH WEIL OSTEOTOMY  06/27/2018   Procedure: LEFT SECOND HAMMERTOE RECONSTRUCTION WITH WEIL OSTEOTOMY;  Surgeon: Kit Rush, MD;  Location: [facility];  Service: Orthopedics;;   PORT-A-CATH REMOVAL Right 02/12/2024   Procedure: REMOVAL PORT-A-CATH;  Surgeon: Ebbie Cough, MD;  Location: [facility];  Service: General;  Laterality: Right;   PORTACATH PLACEMENT Right 09/13/2023   Procedure: INSERTION, TUNNELED CENTRAL VENOUS DEVICE, WITH PORT;  Surgeon: Ebbie Cough, MD;  Location: [facility];  Service: General;  Laterality: Right;  GEN w/PEC BLOCK   TARSAL METATARSAL ARTHRODESIS Left 06/27/2018   Procedure: Left modified McBride and lapidus 1st tarsometatarsal arthrodesis;  Surgeon: Kit Rush, MD;  Location: [facility];  Service: Orthopedics;  Laterality: Left;    TONSILLECTOMY AND ADENOIDECTOMY  1954    SOCIAL HISTORY: Social History   Socioeconomic History   Marital status: Married    Spouse name: Not on file   Number of children: Not on file   Years of education: Not on file   Highest education level: Not on file  Occupational History   Not on file  Tobacco Use   Smoking status: Never   Smokeless tobacco: Never  Vaping Use   Vaping status: Never Used  Substance and Sexual Activity   Alcohol  use: Not Currently   Drug use: No   Sexual activity: Not Currently    Birth control/protection: Post-menopausal  Other Topics Concern   Not on file  Social History Narrative   Not on file   Social Drivers of Health   Financial Resource Strain: Not on file  Food Insecurity: No Food Insecurity (02/04/2024)   Hunger Vital Sign    Worried About Running Out of Food in the Last Year: Never true    Ran Out of Food in the Last Year: Never true  Transportation Needs: No Transportation Needs (02/04/2024)   PRAPARE - Therapist, art (Medical): No    Lack of Transportation (Non-Medical): No  Physical Activity: Not on file  Stress: Not on file  Social Connections: Not on file  Intimate Partner Violence: Not At Risk (02/04/2024)   Humiliation, Afraid, Rape, and Kick questionnaire    Fear of Current or Ex-Partner: No    Emotionally Abused: No    Physically Abused: No    Sexually Abused: No    FAMILY HISTORY: Family History  Problem Relation Age of Onset   Colon polyps Mother    Lung cancer Mother        d. 57   Colon polyps Father    Melanoma Father    Colon cancer Brother        and polyps; dx 77s   Lung cancer Maternal Aunt    Lung cancer Maternal Grandfather    Breast cancer Niece        dx 96s    Review of Systems  Constitutional:  Negative for appetite change, chills, fatigue, fever and unexpected weight change.  HENT:   Negative for hearing loss, lump/mass and trouble swallowing.   Eyes:  Negative for eye problems and icterus.  Respiratory:  Negative for chest tightness, cough and shortness of breath.   Cardiovascular:  Positive for leg swelling. Negative for chest pain and palpitations.  Gastrointestinal:  Negative for abdominal distention, abdominal pain, constipation, diarrhea, nausea and vomiting.  Endocrine: Negative for hot flashes.  Genitourinary:  Negative for bladder incontinence, difficulty urinating, dysuria, frequency and hematuria.   Musculoskeletal:  Negative for arthralgias.  Skin:  Negative for itching and rash.  Neurological:  Negative for dizziness, extremity weakness, headaches and numbness.  Hematological:  Negative for adenopathy. Does not bruise/bleed easily.  Psychiatric/Behavioral:  Negative for depression. The patient is not nervous/anxious.       PHYSICAL EXAMINATION    Vitals:   04/01/24 1058  BP: (!) 98/52  Pulse: 72  Resp: 16  Temp: 98.2 F (36.8 C)  SpO2: 100%    Physical Exam Constitutional:      General: She is not in acute  distress.    Appearance: Normal appearance. She is not toxic-appearing.  HENT:     Head: Normocephalic and atraumatic.     Mouth/Throat:     Mouth: Mucous membranes are moist.     Pharynx: Oropharynx is clear. No oropharyngeal exudate or posterior oropharyngeal erythema.  Eyes:     General: No scleral icterus. Cardiovascular:     Rate and Rhythm: Normal rate and regular rhythm.     Pulses: Normal pulses.     Heart sounds: Normal heart sounds.  Pulmonary:     Effort: Pulmonary effort is normal.     Breath sounds: Normal breath sounds.  Abdominal:  General: Abdomen is flat. Bowel sounds are normal. There is no distension.     Palpations: Abdomen is soft.     Tenderness: There is no abdominal tenderness.  Musculoskeletal:        General: No swelling.     Cervical back: Neck supple.  Lymphadenopathy:     Cervical: No cervical adenopathy.  Skin:    General: Skin is warm and dry.     Findings: No rash.  Neurological:     General: No focal deficit present.     Mental Status: She is alert.  Psychiatric:        Mood and Affect: Mood normal.        Behavior: Behavior normal.     LABORATORY DATA:  CBC    Component Value Date/Time   WBC 9.2 01/28/2024 0850   RBC 3.21 (L) 01/28/2024 0850   HGB 9.4 (L) 01/28/2024 0850   HCT 28.5 (L) 01/28/2024 0850   PLT 356 01/28/2024 0850   MCV 88.8 01/28/2024 0850   MCH 29.3 01/28/2024 0850   MCHC 33.0 01/28/2024 0850   RDW 17.9 (H) 01/28/2024 0850   LYMPHSABS 0.8 01/28/2024 0850   MONOABS 0.4 01/28/2024 0850   EOSABS 0.0 01/28/2024 0850   BASOSABS 0.0 01/28/2024 0850    CMP     Component Value Date/Time   NA 140 01/28/2024 0850   K 3.8 01/28/2024 0850   CL 109 01/28/2024 0850   CO2 22 01/28/2024 0850   GLUCOSE 167 (H) 01/28/2024 0850   BUN 16 01/28/2024 0850   CREATININE 0.67 01/28/2024 0850   CALCIUM 8.5 (L) 01/28/2024 0850   PROT 6.1 (L) 01/28/2024 0850   ALBUMIN 3.8 01/28/2024 0850   AST 14 (L) 01/28/2024 0850   ALT  17 01/28/2024 0850   ALKPHOS 75 01/28/2024 0850   BILITOT 0.3 01/28/2024 0850   GFRNONAA >60 01/28/2024 0850     ASSESSMENT and THERAPY PLAN:   Assessment and Plan Assessment & Plan Stage 1B triple negative breast cancer Diagnosed in March 2025, post right lumpectomy, completed adj chemotherapy with TC    Stye of lower lid - Apply warm compresses to the stye.   All questions were answered. The patient knows to call the clinic with any problems, questions or concerns. We can certainly see the patient much sooner if necessary.  Total encounter time: 30 minutes*in face-to-face visit time, chart review, lab review, care coordination, order entry, and documentation of the encounter time.    *Total Encounter Time as defined by the Centers for Medicare and Medicaid Services includes, in addition to the face-to-face time of a patient visit (documented in the note above) non-face-to-face time: obtaining and reviewing outside history, ordering and reviewing medications, tests or procedures, care coordination (communications with other health care professionals or caregivers) and documentation in the medical record.

## 2024-04-01 NOTE — Progress Notes (Signed)
 [facility] Cancer Follow up:    Heather Lewis LABOR., MD [address] [place] [address]   DIAGNOSIS:  Cancer Staging  Malignant neoplasm of upper-outer quadrant of right breast in female, estrogen receptor negative (HCC) Staging form: Breast, AJCC 8th Edition - Clinical stage from 08/29/2023: Stage IB (cT1c, cN0, cM0, G3, ER-, PR-, HER2-) - Signed by Loretha Ash, MD on 08/29/2023 Stage prefix: Initial diagnosis Histologic grading system: 3 grade system    SUMMARY OF ONCOLOGIC HISTORY: Oncology History  Malignant neoplasm of upper-outer quadrant of right breast in female, estrogen receptor negative (HCC)  08/28/2023 Initial Diagnosis   Malignant neoplasm of upper-outer quadrant of right breast in female, estrogen receptor negative (HCC)    Mammogram   Highly suspicious 1.8 cm palpable mass right breast 10 o'clock position. Highly suspicious 0.9 cm mass noted adjacent to the dominant palpable mass at the 10 o'clock position. The distance between the 2 masses measures less than 1 cm. Indeterminate grouped calcifications in the central outer right breast at posterior depth.    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  Report date is 09/12/2023.   The CancerNext-Expanded gene panel offered by [facility] and includes sequencing, rearrangement, and RNA analysis for the following 76 genes: AIP, ALK, APC, ATM, AXIN2, BAP1, BARD1, BMPR1A, BRCA1, BRCA2, BRIP1, CDC73, CDH1, CDK4, CDKN1B, CDKN2A, CEBPA, CHEK2,  CTNNA1, DDX41, DICER1, ETV6, FH, FLCN, GATA2, LZTR1, MAX, MBD4, MEN1, MET, MLH1, MSH2, MSH3, MSH6, MUTYH, NF1, NF2, NTHL1, PALB2, PHOX2B, PMS2, POT1, PRKAR1A, PTCH1, PTEN, RAD51C, RAD51D, RB1, RET, RUNX1, SDHA, SDHAF2, SDHB, SDHC, SDHD, SMAD4, SMARCA4, SMARCB1, SMARCE1, STK11, SUFU, TMEM127, TP53, TSC1, TSC2, VHL, and WT1 (sequencing and deletion/duplication); EGFR, HOXB13, KIT, MITF, PDGFRA, POLD1, and POLE (sequencing only); EPCAM and GREM1 (deletion/duplication only).    09/13/2023 Definitive Surgery   Right breast lumpectomy, foci of IDC discontinuously involving a fibrotic area of 1.8 cms, grade 3, extensive cancerization of lobules, DCIS high grade, neg margins,  Rt axillary sentinel LN negative. Additional medial margin ( deep resection margin pos for DCIS)   11/26/2023 -  Chemotherapy   Patient is on Treatment Plan : BREAST TC q21d       CURRENT THERAPY: Taxotere /Cytoxan   INTERVAL HISTORY:  Discussed the use of AI scribe software for clinical note transcription with the patient, who gave verbal consent to proceed.  History of Present Illness Heather Lewis is a 78 year old female with breast cancer in remission who presents for follow-up after completing radiation therapy.  She recently completed radiation therapy for breast cancer last Thursday. She experiences soreness in the area where she scratched herself, attributing it to peeling skin post-radiation. She has been careful to avoid further irritation and has not experienced significant skin breakouts.  She reports ongoing fatigue and tiredness. She is currently using a wig due to hair loss from chemotherapy, which she completed prior to radiation.  She inquires about resuming supplements such as CoQ10 and turmeric, and confirms she has been on iron supplements for the past three to four months  due to low levels identified in May. No constipation or nausea from the iron supplementation.   Rest of the pertinent 10 point ROS reviewed  and neg.    Patient Active Problem List   Diagnosis Date Noted   Port-A-Cath in place 11/26/2023   Genetic testing 09/25/2023   Malignant neoplasm of upper-outer quadrant of right breast in female, estrogen receptor negative (HCC) 08/28/2023    is allergic to nitrates, organic and oxycodone .  MEDICAL HISTORY: Past Medical History:  Diagnosis Date   Age-related macular degeneration    Bunion of right foot 08/2017   Cancer (HCC) 08/2023   right breast ICD with DCIS   Complication of anesthesia    hard to wake up from colonoscopy when Propofol  not used   Dental crowns present    Metatarsalgia of right foot 08/2017   2nd toe   Migraines    with change of weather   TMJ syndrome     SURGICAL HISTORY: Past Surgical History:  Procedure Laterality Date   APPENDECTOMY  1960   AXILLARY SENTINEL NODE BIOPSY Right 09/13/2023   Procedure: BIOPSY, LYMPH NODE, SENTINEL, AXILLARY;  Surgeon: Ebbie Cough, MD;  Location: [facility];  Service: General;  Laterality: Right;  right   BREAST BIOPSY Right 08/23/2023   US  RT BREAST BX W LOC DEV 1ST LESION IMG BX SPEC US  GUIDE 08/23/2023 [facility]   BREAST BIOPSY Right 08/23/2023   US  RT BREAST BX W LOC DEV EA ADD LESION IMG BX SPEC US  GUIDE 08/23/2023 [facility]   BREAST BIOPSY Right 08/23/2023   MM RT BREAST BX W LOC DEV 1ST LESION IMAGE BX SPEC STEREO GUIDE 08/23/2023 [facility]   BREAST BIOPSY  09/11/2023   MM RT RADIOACTIVE SEED EA ADD LESION LOC MAMMO GUIDE 09/11/2023 [facility]   BREAST BIOPSY  09/11/2023   MM RT RADIOACTIVE SEED EA ADD LESION LOC MAMMO GUIDE 09/11/2023 [facility]   BREAST BIOPSY  09/11/2023   MM RT RADIOACTIVE SEED LOC MAMMO GUIDE 09/11/2023 [facility]   BREAST LUMPECTOMY Right 10/08/2023   Procedure: BREAST LUMPECTOMY;  Surgeon: Ebbie Cough, MD;  Location: [facility];  Service: General;  Laterality: Right;  RIGHT BREAST MARGIN RE-EXCISION   BREAST  LUMPECTOMY Right 11/06/2023   Procedure: RIGHT BREAST RE-EXCISION LUMPECTOMY;  Surgeon: Ebbie Cough, MD;  Location: [facility];  Service: General;  Laterality: Right;   BREAST LUMPECTOMY WITH RADIOACTIVE SEED LOCALIZATION Right 09/13/2023   Procedure: BREAST LUMPECTOMY WITH RADIOACTIVE SEED LOCALIZATION;  Surgeon: Ebbie Cough, MD;  Location: [facility];  Service: General;  Laterality: Right;  Right breast radioactive seed bracketed lumpectomy   BUNIONECTOMY WITH WEIL OSTEOTOMY Right 09/06/2017   Procedure: Right Lapidus, Modified Woodard Edwards;  Surgeon: Kit Rush, MD;  Location: [facility];  Service: Orthopedics;  Laterality: Right;   COLONOSCOPY WITH PROPOFOL   08/08/2016   HAMMERTOE RECONSTRUCTION WITH WEIL OSTEOTOMY  06/27/2018   Procedure: LEFT SECOND HAMMERTOE RECONSTRUCTION WITH WEIL OSTEOTOMY;  Surgeon: Kit Rush, MD;  Location: [facility];  Service: Orthopedics;;   PORT-A-CATH REMOVAL Right 02/12/2024   Procedure: REMOVAL PORT-A-CATH;  Surgeon: Ebbie Cough, MD;  Location: [facility];  Service: General;  Laterality: Right;   PORTACATH PLACEMENT Right 09/13/2023   Procedure: INSERTION, TUNNELED CENTRAL VENOUS DEVICE, WITH PORT;  Surgeon: Ebbie Cough, MD;  Location: [facility];  Service: General;  Laterality: Right;  GEN w/PEC BLOCK   TARSAL METATARSAL ARTHRODESIS  Left 06/27/2018   Procedure: Left modified McBride and lapidus 1st tarsometatarsal arthrodesis;  Surgeon: Kit Rush, MD;  Location: [facility];  Service: Orthopedics;  Laterality: Left;    TONSILLECTOMY AND ADENOIDECTOMY  1954    SOCIAL HISTORY: Social History   Socioeconomic History   Marital status: Married    Spouse name: Not on file   Number of children: Not on file   Years of education: Not on file   Highest education level: Not on file  Occupational History   Not on file   Tobacco Use   Smoking status: Never   Smokeless tobacco: Never  Vaping Use   Vaping status: Never Used  Substance and Sexual Activity   Alcohol  use: Not Currently   Drug use: No   Sexual activity: Not Currently    Birth control/protection: Post-menopausal  Other Topics Concern   Not on file  Social History Narrative   Not on file   Social Drivers of Health   Financial Resource Strain: Not on file  Food Insecurity: No Food Insecurity (02/04/2024)   Hunger Vital Sign    Worried About Running Out of Food in the Last Year: Never true    Ran Out of Food in the Last Year: Never true  Transportation Needs: No Transportation Needs (02/04/2024)   PRAPARE - Administrator, Civil Service (Medical): No    Lack of Transportation (Non-Medical): No  Physical Activity: Not on file  Stress: Not on file  Social Connections: Not on file  Intimate Partner Violence: Not At Risk (02/04/2024)   Humiliation, Afraid, Rape, and Kick questionnaire    Fear of Current or Ex-Partner: No    Emotionally Abused: No    Physically Abused: No    Sexually Abused: No    FAMILY HISTORY: Family History  Problem Relation Age of Onset   Colon polyps Mother    Lung cancer Mother        d. 33   Colon polyps Father    Melanoma Father    Colon cancer Brother        and polyps; dx 53s   Lung cancer Maternal Aunt    Lung cancer Maternal Grandfather    Breast cancer Niece        dx 96s    Review of Systems  Constitutional:  Negative for appetite change, chills, fatigue, fever and unexpected weight change.  HENT:   Negative for hearing loss, lump/mass and trouble swallowing.   Eyes:  Negative for eye problems and icterus.  Respiratory:  Negative for chest tightness, cough and shortness of breath.   Cardiovascular:  Positive for leg swelling. Negative for chest pain and palpitations.  Gastrointestinal:  Negative for abdominal distention, abdominal pain, constipation, diarrhea, nausea and vomiting.   Endocrine: Negative for hot flashes.  Genitourinary:  Negative for bladder incontinence, difficulty urinating, dysuria, frequency and hematuria.   Musculoskeletal:  Negative for arthralgias.  Skin:  Negative for itching and rash.  Neurological:  Negative for dizziness, extremity weakness, headaches and numbness.  Hematological:  Negative for adenopathy. Does not bruise/bleed easily.  Psychiatric/Behavioral:  Negative for depression. The patient is not nervous/anxious.       PHYSICAL EXAMINATION    Vitals:   04/01/24 1058  BP: (!) 98/52  Pulse: 72  Resp: 16  Temp: 98.2 F (36.8 C)  SpO2: 100%    She appears well Breast exam, post rad changes. No major concerns.  LABORATORY DATA:  CBC  Component Value Date/Time   WBC 9.2 01/28/2024 0850   RBC 3.21 (L) 01/28/2024 0850   HGB 9.4 (L) 01/28/2024 0850   HCT 28.5 (L) 01/28/2024 0850   PLT 356 01/28/2024 0850   MCV 88.8 01/28/2024 0850   MCH 29.3 01/28/2024 0850   MCHC 33.0 01/28/2024 0850   RDW 17.9 (H) 01/28/2024 0850   LYMPHSABS 0.8 01/28/2024 0850   MONOABS 0.4 01/28/2024 0850   EOSABS 0.0 01/28/2024 0850   BASOSABS 0.0 01/28/2024 0850    CMP     Component Value Date/Time   NA 140 01/28/2024 0850   K 3.8 01/28/2024 0850   CL 109 01/28/2024 0850   CO2 22 01/28/2024 0850   GLUCOSE 167 (H) 01/28/2024 0850   BUN 16 01/28/2024 0850   CREATININE 0.67 01/28/2024 0850   CALCIUM 8.5 (L) 01/28/2024 0850   PROT 6.1 (L) 01/28/2024 0850   ALBUMIN 3.8 01/28/2024 0850   AST 14 (L) 01/28/2024 0850   ALT 17 01/28/2024 0850   ALKPHOS 75 01/28/2024 0850   BILITOT 0.3 01/28/2024 0850   GFRNONAA >60 01/28/2024 0850     ASSESSMENT and THERAPY PLAN:   Assessment and Plan Assessment & Plan 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  - Perform Guardant blood test today to monitor for tumor DNA. - Schedule diagnostic mammogram in spring 2026. - Discuss  survivorship visit in six months.  Fatigue secondary to recent radiation therapy Fatigue due to recent radiation therapy, expected to improve in 2-3 weeks.  Iron deficiency, monitoring status post-treatment Iron deficiency treated with supplementation for 3-4 months, no constipation reported. - Draw blood today to check iron levels and blood counts.  Superficial skin abrasion, healing Healing well, no significant intervention needed. - Apply Bactroban ointment to the affected area.   All questions were answered. The patient knows to call the clinic with any problems, questions or concerns. We can certainly see the patient much sooner if necessary.  Total encounter time: 30 minutes*in face-to-face visit time, chart review, lab review, care coordination, order entry, and documentation of the encounter time.    *Total Encounter Time as defined by the Centers for Medicare and Medicaid Services includes, in addition to the face-to-face time of a patient visit (documented in the note above) non-face-to-face time: obtaining and reviewing outside history, ordering and reviewing medications, tests or procedures, care coordination (communications with other health care professionals or caregivers) and documentation in the medical record.

## 2024-04-01 NOTE — Progress Notes (Signed)
 Order placed for Guardant Reveal per MD. Requisition completed for first draw in clinic then Guardant to draw moving forward. Pt was made aware of this.

## 2024-04-02 ENCOUNTER — Other Ambulatory Visit: Payer: Self-pay

## 2024-04-23 ENCOUNTER — Encounter: Payer: Self-pay | Admitting: Hematology and Oncology

## 2024-04-24 ENCOUNTER — Ambulatory Visit
Admission: RE | Admit: 2024-04-24 | Discharge: 2024-04-24 | Disposition: A | Discharge status: Home / Self Care | Payer: Self-pay | Source: Ambulatory Visit | Attending: Radiation Oncology | Admitting: Radiation Oncology

## 2024-04-24 VITALS — BP 129/62 | HR 62 | Temp 98.2°F | Resp 16 | Ht 62.0 in | Wt 115.0 lb

## 2024-04-24 DIAGNOSIS — C50411 Malignant neoplasm of upper-outer quadrant of right female breast: Secondary | ICD-10-CM | POA: Insufficient documentation

## 2024-04-24 DIAGNOSIS — Z79899 Other long term (current) drug therapy: Secondary | ICD-10-CM | POA: Insufficient documentation

## 2024-04-24 DIAGNOSIS — Z923 Personal history of irradiation: Secondary | ICD-10-CM | POA: Diagnosis not present

## 2024-04-24 LAB — GUARDANT REVEAL

## 2024-04-24 NOTE — Progress Notes (Signed)
   Department of Radiation Oncology    Followup Note    Name: Heather Lewis Date: 04/24/2024 MRN: [id number] DOB: [birth year]   Diagnosis:     ICD-10-CM   1. Malignant neoplasm of upper-outer quadrant of right female breast, unspecified estrogen receptor status (HCC)  C50.411         MEDICATIONS: Current Outpatient Medications  Medication Sig Dispense Refill   cholecalciferol (VITAMIN D) 1000 units tablet Take 1,000 Units by mouth daily.     ibuprofen (ADVIL,MOTRIN) 800 MG tablet Take 800 mg by mouth every 8 (eight) hours as needed. migraines     Iron, Ferrous Sulfate, 325 (65 Fe) MG TABS      lidocaine -prilocaine  (EMLA ) cream Apply to affected area once 30 g 3   Multiple Vitamins-Minerals (PRESERVISION AREDS 2) CAPS Take by mouth.     ondansetron  (ZOFRAN ) 8 MG tablet Take 1 tablet (8 mg total) by mouth every 8 (eight) hours as needed for nausea or vomiting. Start on the third day after chemotherapy. 30 tablet 1   prochlorperazine  (COMPAZINE ) 10 MG tablet Take 1 tablet (10 mg total) by mouth every 6 (six) hours as needed for nausea or vomiting. 30 tablet 1   Pseudoephedrine-Acetaminophen  (ALLEREST NO DROWSINESS PO) Take by mouth.     No current facility-administered medications for this encounter.     ALLERGIES: Nitrates, organic and Oxycodone    LABORATORY DATA:  Lab Results  Component Value Date   WBC 4.6 04/01/2024   HGB 12.5 04/01/2024   HCT 38.2 04/01/2024   MCV 92.3 04/01/2024   PLT 249 04/01/2024   Lab Results  Component Value Date   NA 140 01/28/2024   K 3.8 01/28/2024   CL 109 01/28/2024   CO2 22 01/28/2024   Lab Results  Component Value Date   ALT 17 01/28/2024   AST 14 (L) 01/28/2024   ALKPHOS 75 01/28/2024   BILITOT 0.3 01/28/2024     NARRATIVE: Heather Lewis was seen today in followup.  She completed adjuvant right breast radiation approximately 4 weeks ago.  Since completing treatment, she has been seen in follow-up by Dr. Loretha.  She states  that she is doing well overall.  Her energy level is improving.  She reports no pain or itching involving her breasts.   PHYSICAL EXAMINATION: height is 5' 2 (1.575 m) and weight is 115 lb (52.2 kg). Her oral temperature is 98.2 F (36.8 C). Her blood pressure is 129/62 and her pulse is 62. Her respiration is 16 and oxygen saturation is 100%.      She is in no apparent distress.  Examination of her right breast reveals resolving hyperpigmentation.  ASSESSMENT: The patient is doing well approximately 1 month out from completion of adjuvant right breast radiation.  Her radiation related side effects have largely resolved.  PLAN: She continue to follow-up with her surgeon, Dr. Ebbie, as well as her medical oncologist, Dr. Loretha.  Further follow-up in our department will be on a as needed basis, though I encouraged her to contact me anytime with any questions or concerns she may have.     Shantia Sanford A. Jomarie, MD

## 2024-04-25 ENCOUNTER — Telehealth: Payer: Self-pay

## 2024-04-25 NOTE — Telephone Encounter (Signed)
 Called pt per MD to advise Guardant testing was negative/not detected. Pt verbalized understanding of results and knows [facility] will be in touch to schedule 6 mo repeat lab.

## 2024-06-02 ENCOUNTER — Telehealth: Payer: Self-pay

## 2024-06-02 NOTE — Telephone Encounter (Signed)
 Pt called and asked if it is normal for her to experience a fever, chills and body aches after treatment. Pt last chemo was in August and last xrt was in October. Advised pt while fatigue can be common post tx, these symptoms sound like the flu and she should call her PCP to be worked up and treated for it. She is agreeable and will call them. She knows to call us  with any further concerns.

## 2024-06-23 ENCOUNTER — Encounter: Payer: Self-pay | Admitting: Hematology and Oncology

## 2024-06-27 ENCOUNTER — Encounter: Payer: Self-pay | Admitting: Hematology and Oncology

## 2024-06-30 ENCOUNTER — Ambulatory Visit: Payer: Self-pay | Attending: Hematology and Oncology

## 2024-06-30 VITALS — Wt 114.5 lb

## 2024-06-30 DIAGNOSIS — Z483 Aftercare following surgery for neoplasm: Secondary | ICD-10-CM | POA: Insufficient documentation

## 2024-06-30 NOTE — Therapy (Signed)
 " OUTPATIENT PHYSICAL THERAPY SOZO SCREENING NOTE   Patient Name: Heather Lewis MRN: [id number] DOB:[birth year], 79 y.o., female Today's Date: 06/30/2024  PCP: Thurmond Cathlyn LABOR., MD REFERRING PROVIDER: Loretha Ash, MD   PT End of Session - 06/30/24 [phone]     Visit Number 2   # unchanged due to screen only   PT Start Time 0819    PT Stop Time 0825    PT Time Calculation (min) 6 min    Activity Tolerance Patient tolerated treatment well    Behavior During Therapy [facility] for tasks assessed/performed          Past Medical History:  Diagnosis Date   Age-related macular degeneration    Bunion of right foot 08/2017   Cancer (HCC) 08/2023   right breast ICD with DCIS   Complication of anesthesia    hard to wake up from colonoscopy when Propofol  not used   Dental crowns present    Metatarsalgia of right foot 08/2017   2nd toe   Migraines    with change of weather   TMJ syndrome    Past Surgical History:  Procedure Laterality Date   APPENDECTOMY  1960   AXILLARY SENTINEL NODE BIOPSY Right 09/13/2023   Procedure: BIOPSY, LYMPH NODE, SENTINEL, AXILLARY;  Surgeon: Ebbie Cough, MD;  Location: [facility];  Service: General;  Laterality: Right;  right   BREAST BIOPSY Right 08/23/2023   US  RT BREAST BX W LOC DEV 1ST LESION IMG BX SPEC US  GUIDE 08/23/2023 [facility]   BREAST BIOPSY Right 08/23/2023   US  RT BREAST BX W LOC DEV EA ADD LESION IMG BX SPEC US  GUIDE 08/23/2023 [facility]   BREAST BIOPSY Right 08/23/2023   MM RT BREAST BX W LOC DEV 1ST LESION IMAGE BX SPEC STEREO GUIDE 08/23/2023 [facility]   BREAST BIOPSY  09/11/2023   MM RT RADIOACTIVE SEED EA ADD LESION LOC MAMMO GUIDE 09/11/2023 [facility]   BREAST BIOPSY  09/11/2023   MM RT RADIOACTIVE SEED EA ADD LESION LOC MAMMO GUIDE 09/11/2023 [facility]   BREAST BIOPSY  09/11/2023   MM RT RADIOACTIVE SEED LOC MAMMO GUIDE 09/11/2023 [facility]   BREAST LUMPECTOMY Right 10/08/2023    Procedure: BREAST LUMPECTOMY;  Surgeon: Ebbie Cough, MD;  Location: [facility];  Service: General;  Laterality: Right;  RIGHT BREAST MARGIN RE-EXCISION   BREAST LUMPECTOMY Right 11/06/2023   Procedure: RIGHT BREAST RE-EXCISION LUMPECTOMY;  Surgeon: Ebbie Cough, MD;  Location: [facility];  Service: General;  Laterality: Right;   BREAST LUMPECTOMY WITH RADIOACTIVE SEED LOCALIZATION Right 09/13/2023   Procedure: BREAST LUMPECTOMY WITH RADIOACTIVE SEED LOCALIZATION;  Surgeon: Ebbie Cough, MD;  Location: [facility];  Service: General;  Laterality: Right;  Right breast radioactive seed bracketed lumpectomy   BUNIONECTOMY WITH WEIL OSTEOTOMY Right 09/06/2017   Procedure: Right Lapidus, Modified Woodard Edwards;  Surgeon: Kit Rush, MD;  Location: [facility];  Service: Orthopedics;  Laterality: Right;   COLONOSCOPY WITH PROPOFOL   08/08/2016   HAMMERTOE RECONSTRUCTION WITH WEIL OSTEOTOMY  06/27/2018   Procedure: LEFT SECOND HAMMERTOE RECONSTRUCTION WITH WEIL OSTEOTOMY;  Surgeon: Kit Rush, MD;  Location: [facility];  Service: Orthopedics;;   PORT-A-CATH REMOVAL Right 02/12/2024   Procedure: REMOVAL PORT-A-CATH;  Surgeon: Ebbie Cough, MD;  Location: [facility];  Service: General;  Laterality: Right;   PORTACATH PLACEMENT Right 09/13/2023   Procedure: INSERTION, TUNNELED CENTRAL VENOUS DEVICE, WITH PORT;  Surgeon:  Ebbie Cough, MD;  Location: [facility];  Service: General;  Laterality: Right;  GEN w/PEC BLOCK   TARSAL METATARSAL ARTHRODESIS Left 06/27/2018   Procedure: Left modified McBride and lapidus 1st tarsometatarsal arthrodesis;  Surgeon: Kit Rush, MD;  Location: [facility];  Service: Orthopedics;  Laterality: Left;    TONSILLECTOMY AND ADENOIDECTOMY  1954   Patient Active Problem List   Diagnosis Date Noted   Port-A-Cath in place  11/26/2023   Genetic testing 09/25/2023   Malignant neoplasm of upper-outer quadrant of right breast in female, estrogen receptor negative (HCC) 08/28/2023    REFERRING DIAG: right breast cancer at risk for lymphedema  THERAPY DIAG: Aftercare following surgery for neoplasm  PERTINENT HISTORY: Rt lumpectomy with SLNB 09/13/23 with 3 negative nodes removed. Then re-excision 10/08/23 with more positive margins so pt will have a 2nd re-excision on 11/06/23.  Then will have chemotherapy and radiation. Hx: Patient was diagnosed on 08/23/2023 with right grade 3 invasive ductal carcinoma breast cancer. It measured 1.9 cm and was located in the upper outer quadrant. It is triple negative with a Ki67 of 30%. She has a history of a left femur fracture with surgery 02/03/2023.   PRECAUTIONS: right UE Lymphedema risk, None  SUBJECTIVE: Pt returns for her 3 month L-Dex screen.   PAIN:  Are you having pain? No  SOZO SCREENING: Patient was assessed today using the SOZO machine to determine the lymphedema index score. This was compared to her baseline score. It was determined that she is within the recommended range when compared to her baseline and no further action is needed at this time. She will continue SOZO screenings. These are done every 3 months for 2 years post operatively followed by every 6 months for 2 years, and then annually.   L-DEX FLOWSHEETS - 06/30/24 0800       L-DEX LYMPHEDEMA SCREENING   Measurement Type Unilateral    L-DEX MEASUREMENT EXTREMITY Upper Extremity    POSITION  Standing    DOMINANT SIDE Right    At Risk Side Right    BASELINE SCORE (UNILATERAL) 0.4    L-DEX SCORE (UNILATERAL) 0.9    VALUE CHANGE (UNILAT) 0.5         P: Cont every 3 month L-Dex screens until 08/2025.   Aden Berwyn Caldron, PTA 06/30/2024, 8:24 AM     "

## 2024-07-24 ENCOUNTER — Other Ambulatory Visit: Payer: Self-pay

## 2024-09-29 ENCOUNTER — Ambulatory Visit: Attending: Hematology and Oncology

## 2024-09-30 ENCOUNTER — Inpatient Hospital Stay

## 2024-09-30 ENCOUNTER — Inpatient Hospital Stay: Admitting: Adult Health
